# Patient Record
Sex: Male | Born: 1995 | Race: White | Hispanic: No | Marital: Single
Health system: Midwestern US, Community
[De-identification: ages and names within clinical notes are randomized; demographics above are authoritative.]

## PROBLEM LIST (undated history)

## (undated) ENCOUNTER — Ambulatory Visit (HOSPITAL_COMMUNITY): Admission: EM | Source: Home / Self Care

## (undated) DIAGNOSIS — E3431 Constitutional short stature: Secondary | ICD-10-CM

## (undated) DIAGNOSIS — E049 Nontoxic goiter, unspecified: Secondary | ICD-10-CM

## (undated) DIAGNOSIS — R569 Unspecified convulsions: Secondary | ICD-10-CM

## (undated) DIAGNOSIS — R630 Anorexia: Secondary | ICD-10-CM

## (undated) DIAGNOSIS — E300 Delayed puberty: Secondary | ICD-10-CM

## (undated) DIAGNOSIS — R6250 Unspecified lack of expected normal physiological development in childhood: Secondary | ICD-10-CM

## (undated) HISTORY — DX: Constitutional short stature: E34.31

## (undated) HISTORY — PX: OTHER SURGICAL HISTORY: SHX169

## (undated) HISTORY — DX: Delayed puberty: E30.0

## (undated) HISTORY — DX: Unspecified lack of expected normal physiological development in childhood: R62.50

## (undated) HISTORY — DX: Nontoxic goiter, unspecified: E04.9

## (undated) HISTORY — DX: Anorexia: R63.0

## (undated) HISTORY — PX: HAND SURGERY: SHX662

## (undated) HISTORY — DX: Unspecified convulsions: R56.9

---
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
## 2016-07-05 ENCOUNTER — Inpatient Hospital Stay
Admit: 2016-07-05 | Discharge: 2016-07-05 | Disposition: A | Discharge status: Home / Self Care | Payer: PRIVATE HEALTH INSURANCE | Attending: Emergency Medicine

## 2016-07-05 DIAGNOSIS — J029 Acute pharyngitis, unspecified: Secondary | ICD-10-CM

## 2016-07-05 LAB — STREP SCREEN GROUP A THROAT

## 2016-07-05 MED ORDER — PREDNISONE 10 MG PO TABS
10 MG | ORAL_TABLET | ORAL | 0 refills | Status: AC
Start: 2016-07-05 — End: 2016-07-15

## 2016-07-05 NOTE — ED Provider Notes (Signed)
[facility]  eMERGENCY dEPARTMENT eNCOUnter      Pt Name: Alfred Wood  MRN: [id number]  Birthdate Mar 16, 1996  Date of evaluation: 07/05/2016      CHIEF COMPLAINT       Chief Complaint   Patient presents with   ??? Pharyngitis         HISTORY OF PRESENT ILLNESS      The patient presents to the emergency department with a sore throat.  He said it started about 4 this morning.  He feels in the back of his throat.  He says he feels like his uvula is swollen.  He is able to swallow though with some discomfort.  He denies cough.  He denies other URI symptoms.  He denies fever.  He has had no nausea, vomiting or diarrhea.  Nothing makes the symptoms better or worse.  He has not had a rash.       REVIEW OF SYSTEMS       All systems reviewed and negative unless noted in HPI.  The patient denies fever or constitutional symptoms.    Denies vision change.   Sore throat as noted in HPI.  Denies any neck pain or stiffness.    Denies chest pain or shortness of breath.    No nausea,  vomiting or diarrhea.    Denies any dysuria.  Denies urinary frequency or hematuria.    Denies musculoskeletal injury or pain.   Denies any weakness, numbness or focal neurologic deficit.    Denies any skin rash or edema.    No recent psychiatric issues.   No easy bruising or bleeding.   Denies any polyuria, polydypsia or history of immunocompromise.       PAST MEDICAL HISTORY    has no past medical history on file.    SURGICAL HISTORY      has a past surgical history that includes Inner ear surgery (Right).    CURRENT MEDICATIONS       Previous Medications    No medications on file       ALLERGIES     has No Known Allergies.    FAMILY HISTORY     has no family status information on file.    family history is not on file.    SOCIAL HISTORY      reports that he quit smoking 10 days ago. His smoking use included Cigarettes. He does not have any smokeless tobacco history on file. He reports that he drinks alcohol. He reports that he  does not use illicit drugs.    PHYSICAL EXAM     INITIAL VITALS:  height is  (1.727 m) and weight is 59 kg (130 lb). His oral temperature is 97.9 ??F (36.6 ??C). His blood pressure is 135/91 (abnormal) and his pulse is 93. His respiration is 18 and oxygen saturation is 97%.      Patient is alert and oriented, in no apparent distress.    HEENT is atraumatic.    Pupils are PERRL at 5 mm.  Mucous membranes moist.  Posterior pharynx shows minimal erythema.  Uvula does appear slightly swollen.  No peritonsillar abscess.  Patient is managing secretions well and phonating normally.  Neck is supple with no lymphadenopathy.  No JVD.  No meningismus.    Heart sounds regular rate and rhythm with no gallops, murmurs, or rubs.    Lungs clear, no wheezes, rales or rhonchi.    Abdomen: soft, nontender with no pain to palpation.  No rebound or guarding.    Musculoskeletal exam shows no evidence of trauma.    Skin: no rash or edema.    Neurological exam reveals cranial nerves 2 through 12 grossly intact.  Patient has equal grips and normal deep tendon reflexes.    Psychiatric: appropriate  Lymphatics.:  No lymphadenopathy.       DIFFERENTIAL DIAGNOSIS/ MDM:     Strep pharyngitis, uvulitis, PTA    DIAGNOSTIC RESULTS       LABS:  Results for orders placed or performed during the hospital encounter of 07/05/16   Strep Screen Group A Throat   Result Value Ref Range    Specimen Description .THROAT     Special Requests NOT REPORTED     Direct Exam       Rapid Strep A negative. A negative Rapid Group A Strep Screen result does not    Direct Exam        rule out the possibility of Group A Streptococci in the specimen. A Group A    Direct Exam  strep DNA test will be performed.     Direct Exam       Performed at [facility] and [facility], [address]    Direct Exam  [address], [place], [place] [id number]     Status FINAL 07/05/2016          EMERGENCY DEPARTMENT COURSE:   Vitals:    Vitals:    07/05/16 0804   BP: (!) 135/91    Pulse: 93   Resp: 18   Temp: 97.9 ??F (36.6 ??C)   TempSrc: Oral   SpO2: 97%   Weight: 59 kg (130 lb)   Height: 5\' 8"  (1.727 m)     -------------------------  BP: (!) 135/91, Temp: 97.9 ??F (36.6 ??C), Pulse: 93, Resp: 18      Re-evaluation Notes    The patient's rapid strep is negative.  I will write for steroids for the swelling in his throat.  However, he has no airway issues.  I do not feel antibiotics are warranted.  He would like to go to work today.  I recommended gargling salt water.  He is discharged in good condition.      FINAL IMPRESSION      1. Acute pharyngitis, unspecified etiology          DISPOSITION/PLAN   DISPOSITION Decision to Discharge    Condition on Disposition    good    PATIENT REFERRED TO:  Rise PatienceNishit Srivastava, MD  [address]  [address]  [place] [id number]  [phone]    In 1 week        DISCHARGE MEDICATIONS:  New Prescriptions    PREDNISONE (DELTASONE) 10 MG TABLET    Take 4 tablets by mouth once daily for 5 days       (Please note that portions of this note were completed with a voice recognition program.  Efforts were made to edit the dictations but occasionally words are mis-transcribed.)    Luther BradleyEILEEN F Quynn Vilchis,, MD   Attending Emergency Physician         Luther BradleyEileen F Tyeshia Cornforth, MD  07/05/16 [phone]

## 2016-07-06 LAB — STREP A DNA PROBE, AMPLIFICATION: Direct Exam: NEGATIVE

## 2016-12-26 ENCOUNTER — Encounter (HOSPITAL_COMMUNITY): Payer: Self-pay | Admitting: Emergency Medicine

## 2016-12-26 ENCOUNTER — Ambulatory Visit (HOSPITAL_COMMUNITY)
Admission: EM | Admit: 2016-12-26 | Discharge: 2016-12-26 | Disposition: A | Discharge status: Home / Self Care | Payer: Managed Care, Other (non HMO) | Attending: Emergency Medicine | Admitting: Emergency Medicine

## 2016-12-26 DIAGNOSIS — J40 Bronchitis, not specified as acute or chronic: Secondary | ICD-10-CM

## 2016-12-26 MED ORDER — AZITHROMYCIN 250 MG PO TABS: ORAL_TABLET | ORAL | 0 refills | Status: DC

## 2016-12-26 MED ORDER — PREDNISONE 50 MG PO TABS: ORAL_TABLET | ORAL | 0 refills | Status: DC

## 2016-12-26 MED ORDER — ONDANSETRON HCL 4 MG PO TABS: 4.0000 mg | ORAL_TABLET | Freq: Three times a day (TID) | ORAL | 0 refills | Status: DC | PRN

## 2016-12-26 NOTE — Discharge Instructions (Signed)
You have bronchitis. Take azithromycin and prednisone as prescribed. Use zofran every 8 hours as needed for nausea or vomiting. You should see improvement in the next 3-5 days. If you develop fevers, difficulty breathing, or are just not getting better, please come back or go to the emergency room.

## 2016-12-26 NOTE — ED Provider Notes (Signed)
[facility]    CSN: [id number] Arrival date & time: 12/26/16  1742     History   Chief Complaint Chief Complaint  Patient presents with  . Cough    HPI Howard Pena is a 21 y.o. male.   HPI  He is a 21 year old man here for evaluation of cough. Symptoms started 4 or 5 days ago and have been getting gradually worse. He reports nasal congestion, sore throat, and cough. He also reports sharp pains over the xiphoid when he coughs. He has had some intermittent wheezing. He reports a subjective fever yesterday that was treated with Tylenol. No shortness of breath. He has had some nausea and vomiting yesterday and today. He is a smoker, but has not smoked since this started.  Past Medical History:  Diagnosis Date  . Constitutional growth delay   . Goiter   . Physical growth delay   . Poor appetite   . Puberty delay     Patient Active Problem List   Diagnosis Date Noted  . Physical growth delay   . Constitutional growth delay   . Goiter   . Poor appetite   . Puberty delay   . Lack of expected normal physiological development in childhood 04/21/2011  . Goiter, unspecified 04/21/2011  . 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 right ear surgery         Home Medications    Prior to Admission medications   Medication Sig Start Date End Date Taking? Authorizing Provider  azithromycin (ZITHROMAX Z-PAK) 250 MG tablet Take 2 pills today, then 1 pill daily until gone. 12/26/16   Charm Rings, MD  ondansetron (ZOFRAN) 4 MG tablet Take 1 tablet (4 mg total) by mouth every 8 (eight) hours as needed for nausea or vomiting. 12/26/16   Charm Rings, MD  predniSONE (DELTASONE) 50 MG tablet Take 1 pill daily for 5 days. 12/26/16   Charm Rings, MD    Family History Family History  Problem Relation Age of Onset  . Thyroid disease Mother     Social History Social History    Substance Use Topics  . Smoking status: Never Smoker  . Smokeless tobacco: Not on file  . Alcohol use Not on file     Allergies   Patient has no known allergies.   Review of Systems Review of Systems As in history of present illness  Physical Exam Triage Vital Signs ED Triage Vitals  Enc Vitals Group     BP 12/26/16 1924 123/66     Pulse Rate 12/26/16 1924 85     Resp 12/26/16 1924 16     Temp 12/26/16 1924 98.8 F (37.1 C)     Temp Source 12/26/16 1924 Oral     SpO2 12/26/16 1924 100 %     Weight --      Height --      Head Circumference --      Peak Flow --      Pain Score 12/26/16 1926 7     Pain Loc --      Pain Edu? --      Excl. in GC? --    No data found.   Updated Vital Signs BP 123/66 (BP Location: Left Arm)   Pulse 85   Temp 98.8 F (37.1 C) (Oral)   Resp 16   SpO2 100%   Visual Acuity Right Eye  Distance:   Left Eye Distance:   Bilateral Distance:    Right Eye Near:   Left Eye Near:    Bilateral Near:     Physical Exam  Constitutional: He is oriented to person, place, and time. He appears well-developed and well-nourished. No distress.  HENT:  Mouth/Throat: Oropharynx is clear and moist. No oropharyngeal exudate.  Neck: Neck supple.  Cardiovascular: Normal rate, regular rhythm and normal heart sounds.   No murmur heard. Pulmonary/Chest: Effort normal and breath sounds normal. No respiratory distress. He has no wheezes. He has no rales. He exhibits tenderness (over xiphoid).  Lymphadenopathy:    He has no cervical adenopathy.  Neurological: He is alert and oriented to person, place, and time.     UC Treatments / Results  Labs (all labs ordered are listed, but only abnormal results are displayed) Labs Reviewed - No data to display  EKG  EKG Interpretation None       Radiology No results found.  Procedures Procedures (including critical care time)  Medications Ordered in UC Medications - No data to display   Initial  Impression / Assessment and Plan / UC Course  I have reviewed the triage vital signs and the nursing notes.  Pertinent labs & imaging results that were available during my care of the patient were reviewed by me and considered in my medical decision making (see chart for details).  Clinical Course     Treat for bronchitis with azithromycin and prednisone. Zofran as needed for nausea and vomiting. Follow-up as needed.  Final Clinical Impressions(s) / UC Diagnoses   Final diagnoses:  Bronchitis    New Prescriptions Discharge Medication List as of 12/26/2016  8:36 PM    START taking these medications   Details  azithromycin (ZITHROMAX Z-PAK) 250 MG tablet Take 2 pills today, then 1 pill daily until gone., Normal    ondansetron (ZOFRAN) 4 MG tablet Take 1 tablet (4 mg total) by mouth every 8 (eight) hours as needed for nausea or vomiting., Starting Mon 12/26/2016, Normal    predniSONE (DELTASONE) 50 MG tablet Take 1 pill daily for 5 days., Normal         Charm RingsErin J Honig, MD 12/26/16 2049

## 2016-12-26 NOTE — ED Triage Notes (Signed)
[facility] a [facility] of a cough, congestion, sore throat and chest wall pain x 6 days.

## 2017-04-07 ENCOUNTER — Ambulatory Visit (INDEPENDENT_AMBULATORY_CARE_PROVIDER_SITE_OTHER): Payer: Managed Care, Other (non HMO)

## 2017-04-07 ENCOUNTER — Encounter (HOSPITAL_COMMUNITY): Payer: Self-pay | Admitting: Emergency Medicine

## 2017-04-07 ENCOUNTER — Ambulatory Visit (HOSPITAL_COMMUNITY)
Admission: EM | Admit: 2017-04-07 | Discharge: 2017-04-07 | Disposition: A | Discharge status: Home / Self Care | Payer: Managed Care, Other (non HMO) | Attending: Internal Medicine | Admitting: Internal Medicine

## 2017-04-07 DIAGNOSIS — S62231A Other displaced fracture of base of first metacarpal bone, right hand, initial encounter for closed fracture: Secondary | ICD-10-CM | POA: Diagnosis not present

## 2017-04-07 DIAGNOSIS — S62234A Other nondisplaced fracture of base of first metacarpal bone, right hand, initial encounter for closed fracture: Secondary | ICD-10-CM

## 2017-04-07 MED ORDER — IBUPROFEN 800 MG PO TABS
800.0000 mg | ORAL_TABLET | Freq: Once | ORAL | Status: AC
  Administered 2017-04-07: 800 mg via ORAL

## 2017-04-07 MED ORDER — IBUPROFEN 800 MG PO TABS
ORAL_TABLET | ORAL | Status: AC
  Filled 2017-04-07: qty 1

## 2017-04-07 NOTE — ED Provider Notes (Signed)
CSN: [id number]     Arrival date & time 04/07/17  1310 History   First MD Initiated Contact with Patient 04/07/17 1335     Chief Complaint  Patient presents with  . Wrist Pain   (Consider location/radiation/quality/duration/timing/severity/associated sxs/prior Treatment) 21 year old male presents to clinic for evaluation of right thumb. Reports he injured it two weeks ago playing basket ball. States he "jammed it" during the game and he has had worsening swelling, reduced grip strength, and the feeling of "it popping out" He is right handed   The history is provided by the patient.  Wrist Pain  This is a new problem. The current episode started more than 1 week ago. The problem occurs constantly. The problem has been gradually worsening. Pertinent negatives include no chest pain, no abdominal pain, no headaches and no shortness of breath. The symptoms are aggravated by bending. The symptoms are relieved by position. The treatment provided mild relief.    Past Medical History:  Diagnosis Date  . Constitutional growth delay   . Goiter   . Physical growth delay   . Poor appetite   . Puberty delay    Past Surgical History:  Procedure Laterality Date  . right ear surgery     Family History  Problem Relation Age of Onset  . Thyroid disease Mother    Social History  Substance Use Topics  . Smoking status: Former Smoker    Quit date: 03/09/2017  . Smokeless tobacco: Never Used  . Alcohol use Yes     Comment: occasional    Review of Systems  HENT: Negative.   Respiratory: Negative for cough and shortness of breath.   Cardiovascular: Negative for chest pain and palpitations.  Gastrointestinal: Negative for abdominal pain, nausea and vomiting.  Musculoskeletal: Positive for joint swelling. Negative for neck pain and neck stiffness.  Skin: Negative.   Neurological: Negative for light-headedness and headaches.    Allergies  Amoxicillin  Home Medications   Prior to Admission  medications   Medication Sig Start Date End Date Taking? Authorizing Provider  predniSONE (DELTASONE) 50 MG tablet Take 1 pill daily for 5 days. 12/26/16   Charm Rings, MD   Meds Ordered and Administered this Visit   Medications  ibuprofen (ADVIL,MOTRIN) tablet 800 mg (800 mg Oral Given 04/07/17 1345)    BP (!) 137/91   Pulse 90   Temp 99.1 F (37.3 C) (Oral)   Resp 16   Ht  (1.727 m)   Wt 135 lb (61.2 kg)   SpO2 100%   BMI 20.53 kg/m  No data found.   Physical Exam  Constitutional: He is oriented to person, place, and time. He appears well-developed and well-nourished. No distress.  HENT:  Head: Normocephalic and atraumatic.  Right Ear: External ear normal.  Left Ear: External ear normal.  Eyes: Conjunctivae are normal. Right eye exhibits no discharge. Left eye exhibits no discharge.  Musculoskeletal: He exhibits tenderness and deformity.  Neurological: He is alert and oriented to person, place, and time.  Skin: Skin is warm and dry. Capillary refill takes less than 2 seconds. He is not diaphoretic.  Psychiatric: He has a normal mood and affect. His behavior is normal.  Nursing note and vitals reviewed.   Urgent Care Course     Procedures (including critical care time)  Labs Review Labs Reviewed - No data to display  Imaging Review Dg Finger Thumb Right  Result Date: 04/07/2017 CLINICAL DATA:  Basketball injury with first  digit pain, initial encounter EXAM: RIGHT THUMB 2+V COMPARISON:  None. FINDINGS: Some fragmentation at the base of the first metacarpal is noted. No other focal abnormality is seen. IMPRESSION: Fracture through the base of the first metacarpal. Electronically Signed   By: Alcide Clever M.D.   On: 04/07/2017 13:58     MDM   1. Closed nondisplaced fracture of base of first metacarpal bone of right hand, unspecified fracture morphology, initial encounter     Patient had a nondisplaced fracture of the first metacarpal. Patient was placed in a  thumb spica, and referral made to hand orthopedics. Patient given counseling on over-the-counter medicines for pain management, encouraged to wear thumb spica continuously     Dorena Bodo, NP 04/07/17 1456

## 2017-04-07 NOTE — ED Triage Notes (Signed)
PT reports his thumb was severely jammed 2 weeks ago while playing basketball. PT reports continued pain in wrist and thumb

## 2017-04-07 NOTE — Discharge Instructions (Signed)
We have placed your thumb in a "thumb spica" for immobilization. Follow up with a hand specialist for further management of your fracture. Call is office to schedule an appointment for follow up care.

## 2019-09-08 ENCOUNTER — Emergency Department (HOSPITAL_COMMUNITY): Payer: Commercial Managed Care - PPO

## 2019-09-08 ENCOUNTER — Emergency Department (HOSPITAL_COMMUNITY)
Admission: EM | Admit: 2019-09-08 | Discharge: 2019-09-08 | Disposition: A | Discharge status: Home / Self Care | Payer: Commercial Managed Care - PPO | Attending: Emergency Medicine | Admitting: Emergency Medicine

## 2019-09-08 ENCOUNTER — Encounter (HOSPITAL_COMMUNITY): Payer: Self-pay | Admitting: Emergency Medicine

## 2019-09-08 ENCOUNTER — Other Ambulatory Visit: Payer: Self-pay

## 2019-09-08 DIAGNOSIS — F17210 Nicotine dependence, cigarettes, uncomplicated: Secondary | ICD-10-CM | POA: Insufficient documentation

## 2019-09-08 DIAGNOSIS — Z79899 Other long term (current) drug therapy: Secondary | ICD-10-CM | POA: Insufficient documentation

## 2019-09-08 DIAGNOSIS — R569 Unspecified convulsions: Secondary | ICD-10-CM | POA: Insufficient documentation

## 2019-09-08 DIAGNOSIS — R519 Headache, unspecified: Secondary | ICD-10-CM

## 2019-09-08 LAB — CBC
HCT: 41.1 % (ref 39.0–52.0)
Hemoglobin: 13.4 g/dL (ref 13.0–17.0)
MCH: 28 pg (ref 26.0–34.0)
MCHC: 32.6 g/dL (ref 30.0–36.0)
MCV: 86 fL (ref 80.0–100.0)
Platelets: 283 10*3/uL (ref 150–400)
RBC: 4.78 MIL/uL (ref 4.22–5.81)
RDW: 12.7 % (ref 11.5–15.5)
WBC: 5.3 10*3/uL (ref 4.0–10.5)
nRBC: 0 % (ref 0.0–0.2)

## 2019-09-08 LAB — URINALYSIS, ROUTINE W REFLEX MICROSCOPIC
Bacteria, UA: NONE SEEN
Bilirubin Urine: NEGATIVE
Glucose, UA: NEGATIVE mg/dL
Hgb urine dipstick: NEGATIVE
Ketones, ur: NEGATIVE mg/dL
Leukocytes,Ua: NEGATIVE
Nitrite: NEGATIVE
Protein, ur: 30 mg/dL — AB
Specific Gravity, Urine: 1.024 (ref 1.005–1.030)
pH: 6 (ref 5.0–8.0)

## 2019-09-08 LAB — RAPID URINE DRUG SCREEN, HOSP PERFORMED
Amphetamines: NOT DETECTED
Barbiturates: NOT DETECTED
Benzodiazepines: NOT DETECTED
Cocaine: NOT DETECTED
Opiates: NOT DETECTED
Tetrahydrocannabinol: NOT DETECTED

## 2019-09-08 LAB — BASIC METABOLIC PANEL
Anion gap: 9 (ref 5–15)
BUN: 15 mg/dL (ref 6–20)
CO2: 24 mmol/L (ref 22–32)
Calcium: 9.3 mg/dL (ref 8.9–10.3)
Chloride: 105 mmol/L (ref 98–111)
Creatinine, Ser: 0.91 mg/dL (ref 0.61–1.24)
GFR calc Af Amer: >60 mL/min (ref >60)
GFR calc non Af Amer: >60 mL/min (ref >60)
Glucose, Bld: 103 mg/dL — ABNORMAL HIGH (ref 70–99)
Potassium: 4.1 mmol/L (ref 3.5–5.1)
Sodium: 138 mmol/L (ref 135–145)

## 2019-09-08 LAB — ETHANOL: Alcohol, Ethyl (B): <10 mg/dL (ref <10)

## 2019-09-08 MED ORDER — ACETAMINOPHEN 325 MG PO TABS
650.0000 mg | ORAL_TABLET | Freq: Once | ORAL | Status: AC
  Administered 2019-09-08: 20:00:00 650 mg via ORAL
  Filled 2019-09-08: qty 2

## 2019-09-08 MED ORDER — SODIUM CHLORIDE 0.9 % IV BOLUS
1000.0000 mL | Freq: Once | INTRAVENOUS | Status: AC
  Administered 2019-09-08: 1000 mL via INTRAVENOUS

## 2019-09-08 NOTE — Discharge Instructions (Signed)
You were seen in the ED today after seizure like activity Your labwork and CT Head were all reassuring today Please follow up with [facility] regarding your seizure.  Follow up with your PCP as well regarding your ED visit today. If you do not have one you can follow up with [facility] as needed for primary care needs.  Return to the ED for any worsening symptoms.

## 2019-09-08 NOTE — ED Triage Notes (Signed)
Pt reports he was sleeping @ 45 min ago per girlfriend he began shaking in his sleep fell off the bed, continues shaking and foaming in his mouth. Pt does not recall events, Pt does have red/ raised area to R forehead and c/o HA. No seizure history.

## 2019-09-08 NOTE — ED Provider Notes (Signed)
[facility] EMERGENCY DEPARTMENT Provider Note   CSN: [id number] Arrival date & time: 09/08/19  1717     History   Chief Complaint Chief Complaint  Patient presents with  . Seizures    HPI Howard Pena is a 23 y.o. male who presents to the ED with seizure-like activity that began 45 minutes prior to arrival.  Patient reports that he works third shift and typically sleeps through the day.  He was asleep around 4 PM today when his girlfriend noticed that he began having full body shaking which caused him to fall off the floor and hit his head.  Girlfriend reports that patient was shaking for approximately 2 minutes prior to falling and hitting head.  He then continued to shake for approximately 3 minutes before it subsided.  Girlfriend reports the patient was "foaming at the mouth."  No History of seizure-like activity in the past.  Patient currently complaining of a headache but otherwise has no complaints.  He does have a hematoma to the right forehead.  States he smokes about 5 cigarettes/day.  He denies any drug use including marijuana.  Patient states he drinks about 112 ounce beer per day but denies heavy alcohol use.  Denies fever, chills, vision changes, unilateral weakness or numbness, neck stiffness, rash, nausea, vomiting, abdominal pain, chest pain, shortness of breath, any other associated symptoms.        Past Medical History:  Diagnosis Date  . Constitutional growth delay   . Goiter   . Physical growth delay   . Poor appetite   . Puberty delay     Patient Active Problem List   Diagnosis Date Noted  . Physical growth delay   . Constitutional growth delay   . Goiter   . Poor appetite   . Puberty delay   . Lack of expected normal physiological development in childhood 04/21/2011  . Goiter, unspecified 04/21/2011  . 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 right ear surgery          Home Medications    Prior to Admission medications   Medication Sig Start Date End Date Taking? Authorizing Provider  predniSONE (DELTASONE) 50 MG tablet Take 1 pill daily for 5 days. 12/26/16   Charm Rings, MD    Family History Family History  Problem Relation Age of Onset  . Thyroid disease Mother     Social History Social History   Tobacco Use  . Smoking status: Current Every Day Smoker  . Smokeless tobacco: Never Used  Substance Use Topics  . Alcohol use: Yes    Comment: occasional  . Drug use: No     Allergies   Amoxicillin   Review of Systems Review of Systems  Constitutional: Negative for chills and fever.  HENT: Negative for congestion.   Eyes: Negative for visual disturbance.  Respiratory: Negative for cough.   Cardiovascular: Negative for chest pain.  Gastrointestinal: Negative for abdominal pain, nausea and vomiting.  Genitourinary: Negative for difficulty urinating.  Musculoskeletal: Negative for myalgias, neck pain and neck stiffness.  Skin: Negative for rash.  Neurological: Positive for seizures and headaches.     Physical Exam Updated Vital Signs BP 135/87 (BP Location: Right Arm)   Pulse (!) 112   Temp 98.6 F (37 C) (Oral)   Resp 18   Ht 5\' 8"  (1.727 m)   Wt 61.2 kg   SpO2  100%   BMI 20.51 kg/m   Physical Exam Vitals signs and nursing note reviewed.  Constitutional:      Appearance: He is not ill-appearing.  HENT:     Head: Normocephalic.     Comments: Hematoma present to right forehead.  No raccoon sign or battle sign.  No hemotympanum bilaterally.  No lesions noted to oral cavity.  Airway intact.     Right Ear: Tympanic membrane normal.     Left Ear: Tympanic membrane normal.  Eyes:     Conjunctiva/sclera: Conjunctivae normal.  Neck:     Musculoskeletal: Neck supple.  Cardiovascular:     Rate and Rhythm: Normal rate and regular rhythm.     Pulses: Normal pulses.  Pulmonary:     Effort:  Pulmonary effort is normal.     Breath sounds: Normal breath sounds. No wheezing, rhonchi or rales.  Chest:     Chest wall: No tenderness.  Abdominal:     Palpations: Abdomen is soft.     Tenderness: There is no abdominal tenderness. There is no right CVA tenderness, left CVA tenderness, guarding or rebound.  Musculoskeletal: Normal range of motion.     Comments: No C, T, L midline spinal tenderness.  No tenderness throughout back diffusely.  No tenderness to all joints including shoulders, elbows, wrists, hips, knees, ankles.  Able to move extremities without difficulty. Strength 5 out of 5 to bilateral upper and lower extremities.  Patient intact throughout.  2+ radial and DP pulses bilaterally.   Skin:    General: Skin is warm and dry.  Neurological:     Mental Status: He is alert.     Comments: CN 3-12 grossly intact A&O x4 GCS 15 Sensation and strength intact Gait nonataxic including with tandem walking Coordination with finger-to-nose WNL Neg romberg, neg pronator drift       ED Treatments / Results  Labs (all labs ordered are listed, but only abnormal results are displayed) Labs Reviewed  BASIC METABOLIC PANEL - Abnormal; Notable for the following components:      Result Value   Glucose, Bld 103 (*)    All other components within normal limits  URINALYSIS, ROUTINE W REFLEX MICROSCOPIC - Abnormal; Notable for the following components:   Protein, ur 30 (*)    All other components within normal limits  CBC  RAPID URINE DRUG SCREEN, HOSP PERFORMED  ETHANOL  CBG MONITORING, ED    EKG None  Radiology Ct Head Wo Contrast  Result Date: 09/08/2019 CLINICAL DATA:  Seizure EXAM: CT HEAD WITHOUT CONTRAST TECHNIQUE: Contiguous axial images were obtained from the base of the skull through the vertex without intravenous contrast. COMPARISON:  None. FINDINGS: Brain: No evidence of acute infarction, hemorrhage, hydrocephalus, extra-axial collection or mass lesion/mass effect.  Vascular: No hyperdense vessel or unexpected calcification. Skull: Normal. Negative for fracture or focal lesion. Sinuses/Orbits: No acute finding. Other: None. IMPRESSION: No acute intracranial pathology. Electronically Signed   By: Eddie Candle M.D.   On: 09/08/2019 20:37    Procedures Procedures (including critical care time)  Medications Ordered in ED Medications  sodium chloride 0.9 % bolus 1,000 mL (1,000 mLs Intravenous New Bag/Given 09/08/19 2019)  acetaminophen (TYLENOL) tablet 650 mg (650 mg Oral Given 09/08/19 2018)     Initial Impression / Assessment and Plan / ED Course  I have reviewed the triage vital signs and the nursing notes.  Pertinent labs & imaging results that were available during my care of the patient were reviewed by  me and considered in my medical decision making (see chart for details).    23 year old male who presents with new onset seizure-like activity that began around 4 PM today and lasted approximately 5 minutes prior to subsiding.  Girlfriend witnessed activity.  States that patient fell off the bed and hit his head during this time.  And is currently complaining of a headache.  He has no other complaints at this time.  No other signs of head trauma including no lesions noted in the oral cavity.  No malocclusion.  No tenderness throughout body including midline back or any joints.  She denies any illicit drug use.  He does smoke about 5 cigarettes/day.  Does drink 1 12 ounce beer per day.    Blood work obtained prior to being seen.  No electrolyte abnormalities.  Glucose within normal range.  Without history of diabetes.  No leukocytosis.  Hemoglobin stable.  Added on EtOH, urinalysis, UDS.  Will obtain CT head given new onset seizure as well as patient hit his head on the floor.  He is not anticoagulated.  Has no focal neuro deficits on exam today.  No meningeal signs.  He is afebrile in the ED.  Initially tachycardic on arrival but this has decreased while in  room.  If all findings equivocal will have patient follow-up outpatient with neurology.   Urinalysis and UDS negative.  EtOH negative.  CT head reassuring today without acute findings.  Since headache treated with Tylenol.  He is advised to follow-up with neurology regarding his seizure.  [facility] as well given patient does not have PCP.  Strict return precautions have been discussed with patient.  He is in agreement with plan at this time stable for discharge home.   This note was prepared using Dragon voice recognition software and may include unintentional dictation errors due to the inherent limitations of voice recognition software.       Final Clinical Impressions(s) / ED Diagnoses   Final diagnoses:  Seizure (HCC)  Nonintractable headache, unspecified chronicity pattern, unspecified headache type    ED Discharge Orders    None       Tanda RockersVenter, Lougenia Morrissey, Cordelia Poche-C 09/08/19 2045    Lorre NickAllen, Anthony, MD 09/11/19 [phone]

## 2020-01-22 ENCOUNTER — Other Ambulatory Visit: Payer: Self-pay

## 2020-01-22 ENCOUNTER — Encounter: Payer: Self-pay | Admitting: Neurology

## 2020-01-22 ENCOUNTER — Ambulatory Visit: Payer: Commercial Managed Care - PPO | Admitting: Neurology

## 2020-01-22 VITALS — BP 142/102 | HR 79 | Temp 97.2°F | Ht 68.0 in | Wt 125.0 lb

## 2020-01-22 DIAGNOSIS — G40802 Other epilepsy, not intractable, without status epilepticus: Secondary | ICD-10-CM | POA: Diagnosis not present

## 2020-01-22 MED ORDER — LEVETIRACETAM 750 MG PO TABS: 750.0000 mg | ORAL_TABLET | Freq: Two times a day (BID) | ORAL | 11 refills | Status: DC

## 2020-01-22 NOTE — Patient Instructions (Signed)
Seizure, Adult A seizure is a sudden burst of abnormal electrical activity in the brain. Seizures usually last from 30 seconds to 2 minutes. They can cause many different symptoms. Usually, seizures are not harmful unless they last a long time. What are the causes? Common causes of this condition include:  Fever or infection.  Conditions that affect the brain, such as: ? A brain abnormality that you were born with. ? A brain or head injury. ? Bleeding in the brain. ? A tumor. ? Stroke. ? Brain disorders such as autism or cerebral palsy.  Low blood sugar.  Conditions that are passed from parent to child (are inherited).  Problems with substances, such as: ? Having a reaction to a drug or a medicine. ? Suddenly stopping the use of a substance (withdrawal). In some cases, the cause may not be known. A person who has repeated seizures over time without a clear cause has a condition called epilepsy. What increases the risk? You are more likely to get this condition if you have:  A family history of epilepsy.  Had a seizure in the past.  A brain disorder.  A history of head injury, lack of oxygen at birth, or strokes. What are the signs or symptoms? There are many types of seizures. The symptoms vary depending on the type of seizure you have. Examples of symptoms during a seizure include:  Shaking (convulsions).  Stiffness in the body.  Passing out (losing consciousness).  Head nodding.  Staring.  Not responding to sound or touch.  Loss of bladder control and bowel control. Some people have symptoms right before and right after a seizure happens. Symptoms before a seizure may include:  Fear.  Worry (anxiety).  Feeling like you may vomit (nauseous).  Feeling like the room is spinning (vertigo).  Feeling like you saw or heard something before (dj vu).  Odd tastes or smells.  Changes in how you see. You may see flashing lights or spots. Symptoms after a  seizure happens can include:  Confusion.  Sleepiness.  Headache.  Weakness on one side of the body. How is this treated? Most seizures will stop on their own in under 5 minutes. In these cases, no treatment is needed. Seizures that last longer than 5 minutes will usually need treatment. Treatment can include:  Medicines given through an IV tube.  Avoiding things that are known to cause your seizures. These can include medicines that you take for another condition.  Medicines to treat epilepsy.  Surgery to stop the seizures. This may be needed if medicines do not help. Follow these instructions at home: Medicines  Take over-the-counter and prescription medicines only as told by your doctor.  Do not eat or drink anything that may keep your medicine from working, such as alcohol. Activity  Do not do any activities that would be dangerous if you had another seizure, like driving or swimming. Wait until your doctor says it is safe for you to do them.  If you live in the [place] ask your local DMV (department of motor vehicles) when you can drive.  Get plenty of rest. Teaching others Teach friends and family what to do when you have a seizure. They should:  Lay you on the ground.  Protect your head and body.  Loosen any tight clothing around your neck.  Turn you on your side.  Not hold you down.  Not put anything into your mouth.  Know whether or not you need emergency care.  Stay   with you until you are better.  General instructions  Contact your doctor each time you have a seizure.  Avoid anything that gives you seizures.  Keep a seizure diary. Write down: ? What you think caused each seizure. ? What you remember about each seizure.  Keep all follow-up visits as told by your doctor. This is important. Contact a doctor if:  You have another seizure.  You have seizures more often.  There is any change in what happens during your seizures.  You keep having  seizures with treatment.  You have symptoms of being sick or having an infection. Get help right away if:  You have a seizure that: ? Lasts longer than 5 minutes. ? Is different than seizures you had before. ? Makes it harder to breathe. ? Happens after you hurt your head.  You have any of these symptoms after a seizure: ? Not being able to speak. ? Not being able to use a part of your body. ? Confusion. ? A bad headache.  You have two or more seizures in a row.  You do not wake up right after a seizure.  You get hurt during a seizure. These symptoms may be an emergency. Do not wait to see if the symptoms will go away. Get medical help right away. Call your local emergency services (911 in the [place] Do not drive yourself to the hospital. Summary  Seizures usually last from 30 seconds to 2 minutes. Usually, they are not harmful unless they last a long time.  Do not eat or drink anything that may keep your medicine from working, such as alcohol.  Teach friends and family what to do when you have a seizure.  Contact your doctor each time you have a seizure. This information is not intended to replace advice given to you by your health care provider. Make sure you discuss any questions you have with your health care provider. Document Revised: 02/22/2019 Document Reviewed: 02/22/2019 Elsevier Patient Education  2020 Else[employer] Inc[employer] Levetiracetam tablets What is this medicine? LEVETIRACETAM (lee ve tye RA se tam) is an antiepileptic drug. It is used with other medicines to treat certain types of seizures. This medicine may be used for other purposes; ask your health care provider or pharmacist if you have questions. COMMON BRAND NAME(S): Keppra, Roweepra What should I tell my health care provider before I take this medicine? They need to know if you have any of these conditions:  kidney disease  suicidal thoughts, plans, or attempt; a previous suicide attempt by you or a  family member  an unusual or allergic reaction to levetiracetam, other medicines, foods, dyes, or preservatives  pregnant or trying to get pregnant  breast-feeding How should I use this medicine? Take this medicine by mouth with a glass of water. Follow the directions on the prescription label. Swallow the tablets whole. Do not crush or chew this medicine. You may take this medicine with or without food. Take your doses at regular intervals. Do not take your medicine more often than directed. Do not stop taking this medicine or any of your seizure medicines unless instructed by your doctor or health care professional. Stopping your medicine suddenly can increase your seizures or their severity. A special MedGuide will be given to you by the pharmacist with each prescription and refill. Be sure to read this information carefully each time. Contact your pediatrician or health care professional regarding the use of this medication in children. While this drug may be  prescribed for children as young as 4 years of age for selected conditions, precautions do apply. Overdosage: If you think you have taken too much of this medicine contact a poison control center or emergency room at once. NOTE: This medicine is only for you. Do not share this medicine with others. What if I miss a dose? If you miss a dose, take it as soon as you can. If it is almost time for your next dose, take only that dose. Do not take double or extra doses. What may interact with this medicine? This medicine may interact with the following medications:  carbamazepine  colesevelam  probenecid  sevelamer This list may not describe all possible interactions. Give your health care provider a list of all the medicines, herbs, non-prescription drugs, or dietary supplements you use. Also tell them if you smoke, drink alcohol, or use illegal drugs. Some items may interact with your medicine. What should I watch for while using this  medicine? Visit your doctor or health care provider for a regular check on your progress. Wear a medical identification bracelet or chain to say you have epilepsy, and carry a card that lists all your medications. This medicine may cause serious skin reactions. They can happen weeks to months after starting the medicine. Contact your health care provider right away if you notice fevers or flu-like symptoms with a rash. The rash may be red or purple and then turn into blisters or peeling of the skin. Or, you might notice a red rash with swelling of the face, lips or lymph nodes in your neck or under your arms. It is important to take this medicine exactly as instructed by your health care provider. When first starting treatment, your dose may need to be adjusted. It may take weeks or months before your dose is stable. You should contact your doctor or health care provider if your seizures get worse or if you have any new types of seizures. You may get drowsy or dizzy. Do not drive, use machinery, or do anything that needs mental alertness until you know how this medicine affects you. Do not stand or sit up quickly, especially if you are an older patient. This reduces the risk of dizzy or fainting spells. Alcohol may interfere with the effect of this medicine. Avoid alcoholic drinks. The use of this medicine may increase the chance of suicidal thoughts or actions. Pay special attention to how you are responding while on this medicine. Any worsening of mood, or thoughts of suicide or dying should be reported to your health care provider right away. Women who become pregnant while using this medicine may enroll in the [place] Antiepileptic Drug Pregnancy Registry by calling 1-888-233-2334. This registry collects information about the safety of antiepileptic drug use during pregnancy. What side effects may I notice from receiving this medicine? Side effects that you should report to your doctor or health  care professional as soon as possible:  allergic reactions like skin rash, itching or hives, swelling of the face, lips, or tongue  breathing problems  dark urine  general ill feeling or flu-like symptoms  problems with balance, talking, walking  rash, fever, and swollen lymph nodes  redness, blistering, peeling or loosening of the skin, including inside the mouth  unusually weak or tired  worsening of mood, thoughts or actions of suicide or dying  yellowing of the eyes or skin Side effects that usually do not require medical attention (report to your doctor or health care   professional if they continue or are bothersome):  diarrhea  dizzy, drowsy  headache  loss of appetite This list may not describe all possible side effects. Call your doctor for medical advice about side effects. You may report side effects to FDA at 1-800-FDA-1088. Where should I keep my medicine? Keep out of reach of children. Store at room temperature between 15 and 30 degrees C (59 and 86 degrees F). Throw away any unused medicine after the expiration date. NOTE: This sheet is a summary. It may not cover all possible information. If you have questions about this medicine, talk to your doctor, pharmacist, or health care provider.  2020 Elsevier/Gold Standard (2019-03-08 15:23:36)

## 2020-01-22 NOTE — Progress Notes (Signed)
S[facility]EP [facility]INE CL[facility]IC    Provider:  Melvyn Novas, MD  Primary Care Physician:  Patient, No Pcp Per No address on file     Referring Provider: Lorre Nick, MD         Chief Complaint according to patient   Patient presents with:    . New Patient (Initial Visit)           HISTORY OF PRESENT ILLNESS:  Howard Pena is a 24 y.o. year old  African American male patient and  seen here on 01/22/2020 for a seizure work up. He presented to the ED on 09-08-19, and was referred to neurology. He had 4 spells total.  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  He was asleep in the afternoon which is his usual time to rest after work until his very first seizure occurred at about 4 or 4:30 PM.  Since then all other spells have also occurred out of sleep and has been witnessed by his male partner.  He has not woken up from the spells directly but she has noticed frothy discharge at the mouth and most recently bleeding as the patient suffered a tongue bite.  When he finally came to his body was sore and achy, he was confused and felt exhausted completely fatigued.  The most recent spell was also followed by a headache.  One of the nights that he had seizures and sleep he had 2 spells in short succession and his feels it was not quite sure how long each spell lasted but he seemed to have been snoring loudly and was very difficult to arouse he was unresponsive.  Heavy or labored breathing is a common side effect of post ictal recovery. He was very dizzy and nauseated after his seizure, symptoms lasted  for a day   Chief concern according to patient : sleep seizures.    I have the pleasure of seeing Howard Pena today, a right-handedAfrican American male with a possible nocturnal seizure/ sleep disorder.  He  has a past medical history of Constitutional growth delay, Goiter,  Physical growth delay, Poor appetite, and Puberty delay. " late bloomer".      Family medical history: no seizure history    Social history:  Patient is working as a Production designer, theatre/television/film and lives in a household with fiancee and new born baby- son is 55 month old. The patient currently works in shifts( Chief Technology Officer,) Pets are present: bearded dragon. Tobacco use quit smoking at sons birthday.  ETOH use - moderate ,  Caffeine intake in form of Coffee( none) Soda( at work - 2-3 a day) Tea ( rare ) and lots of  energy drinks- 2-3 monsters.  Hobbies : none .    Sleep habits are as follows: The patient's dinner time is at work- night shifts- 5 Pm through 6 AM.  He is home by 8.30 AM-  Sleeping 10 AM -16 hours. Averaging 12-16 hours at work and 6-7 hours of sleep.  The preferred sleep position is prone ,  Co-sleeping with baby and fiancee- with the support of 1 pillows.  Dreams are reportedly frequent.    Review of Systems: Out of a complete 14 system review, the patient complains of only the following symptoms, and all other reviewed systems are negative.:    Seizure- sleep   Social History   Socioeconomic History  .  Marital status: Single    Spouse name: Not on file  . Number of children: Not on file  . Years of education: Not on file  . Highest education level: Not on file  Occupational History  . Not on file  Tobacco Use  . Smoking status: Current Every Day Smoker  . Smokeless tobacco: Never Used  Substance and Sexual Activity  . Alcohol use: Yes    Comment: occasional  . Drug use: No  . Sexual activity: Not on file  Other Topics Concern  . Not on file  Social History Narrative  . Not on file   Social Determinants of Health   Financial Resource Strain:   . Difficulty of Paying Living Expenses: Not on file  Food Insecurity:   . Worried About Programme researcher, broadcasting/film/video in the Last Year: Not on file  . Ran Out of Food in the Last Year: Not on file  Transportation Needs:   .  Lack of Transportation (Medical): Not on file  . Lack of Transportation (Non-Medical): Not on file  Physical Activity:   . Days of Exercise per Week: Not on file  . Minutes of Exercise per Session: Not on file  Stress:   . Feeling of Stress : Not on file  Social Connections:   . Frequency of Communication with Friends and Family: Not on file  . Frequency of Social Gatherings with Friends and Family: Not on file  . Attends Religious Services: Not on file  . Active Member of Clubs or Organizations: Not on file  . Attends Banker Meetings: Not on file  . Marital Status: Not on file    Family History  Problem Relation Age of Onset  . Thyroid disease Mother     Past Medical History:  Diagnosis Date  . Constitutional growth delay   . Goiter   . Physical growth delay   . Poor appetite   . Puberty delay     Past Surgical History:  Procedure Laterality Date  . right ear surgery       No current outpatient medications on file prior to visit.   No current facility-administered medications on file prior to visit.    Allergies  Allergen Reactions  . Amoxicillin     Physical exam:  Today's Vitals   01/22/20 0923  BP: (!) 142/102  Pulse: 79  Temp: (!) 97.2 F (36.2 C)  Weight: 125 lb (56.7 kg)  Height: 5\' 8"  (1.727 m)   Body mass index is 19.01 kg/m.   Wt Readings from Last 3 Encounters:  01/22/20 125 lb (56.7 kg)  09/08/19 134 lb 14.7 oz (61.2 kg)  04/07/17 135 lb (61.2 kg)     Ht Readings from Last 3 Encounters:  01/22/20 5\' 8"  (1.727 m)  09/08/19 5\' 8"  (1.727 m)  04/07/17 5\' 8"  (1.727 m)      General: The patient is awake, alert and appears not in acute distress. The patient is well groomed. Head: Normocephalic, atraumatic. Neck is supple. Mallampati 1,  neck circumference:14 inches . Nasal airflow patent.  Retrognathia is mild . Tongue bite mark has healed.  Dental status: intact  Cardiovascular:  Regular rate and cardiac rhythm by pulse,   without distended neck veins. Respiratory: Lungs are clear to auscultation.  Skin:  Without evidence of ankle edema, or rash. Trunk: The patient's posture is erect.   Neurologic exam : The patient is awake and alert, oriented to place and time.   Memory subjective described  as intact.  Attention span & concentration ability appears normal.  Speech is fluent,  without dysarthria, dysphonia or aphasia.  Mood and affect are appropriate.   Cranial nerves: no loss of smell or taste reported  Pupils are equal and briskly reactive to light. Funduscopic exam deferred.   Extraocular movements in vertical and horizontal planes were intact and without nystagmus. No Diplopia. Visual fields by finger perimetry are intact. Hearing was intact to soft voice and finger rubbing.    Facial sensation intact to fine touch. Facial motor strength is symmetric and tongue and uvula move midline.  Neck ROM : rotation, tilt and flexion extension were normal for age and shoulder shrug was symmetrical.    Motor exam:  Symmetric bulk, tone and ROM.   Normal tone without cog wheeling, symmetric grip strength .   Sensory:  Fine touch, pinprick and vibration were tested  and  normal.  Proprioception tested in the upper extremities was normal. Coordination: Rapid alternating movements in the fingers/hands were of normal speed.   Gait and station: Patient could rise unassisted from a seated position, walked without assistive device.  Stance is of normal width/ base and the patient turned with 3 steps.  Toe and heel walk were deferred.  Deep tendon reflexes: in the  upper and lower extremities are symmetric and intact.  Babinski response was deferred.        After spending a total time of 45 minutes face to face and additional time for physical and neurologic examination, review of laboratory studies,  personal review of imaging studies, reports and results of other testing and review of referral information /  records as far as provided in visit, I have established the following assessments:  1)  Nocturnal/ sleep seizure- in a shift worker with high consumption of energy drinks.   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  2) BRAIN MRI  with and without. 3) Keppra 750 mg bid.   I would like to thank the ED [facility] , Dr Lacretia Leigh, MD , for allowing me to see Mr. Novosad.  In short, Kathryn Linarez is presenting with new onset seizures.  I plan to follow up either personally or through our NP within 1-2 month.   CC: I will share my notes with [facility], Dr Trilby Drummer, DO  The patient understood that driving restrictions apply for 6 month, and those extend to his operating machinery at work.   Keppra was ordered as a generic 750 mg bid.   Electronically signed by: Larey Seat, MD 01/22/2020 9:49 AM  [facility] certified by The [employer] of Sleep Medicine and Diplomate of the [employer] of Sleep Medicine. Board certified In Neurology through the [employer], Fellow of the [employer] of Neurology. Medical Director of [employer].

## 2020-01-26 ENCOUNTER — Ambulatory Visit (INDEPENDENT_AMBULATORY_CARE_PROVIDER_SITE_OTHER): Payer: Commercial Managed Care - PPO | Admitting: Neurology

## 2020-01-26 DIAGNOSIS — G478 Other sleep disorders: Secondary | ICD-10-CM | POA: Diagnosis not present

## 2020-01-26 DIAGNOSIS — G4726 Circadian rhythm sleep disorder, shift work type: Secondary | ICD-10-CM

## 2020-01-26 DIAGNOSIS — G40802 Other epilepsy, not intractable, without status epilepticus: Secondary | ICD-10-CM

## 2020-01-27 ENCOUNTER — Telehealth: Payer: Self-pay | Admitting: *Deleted

## 2020-01-27 ENCOUNTER — Telehealth: Payer: Self-pay | Admitting: Neurology

## 2020-01-27 NOTE — Telephone Encounter (Signed)
received paperwork to be completed for the patient for accomodations to be made at his place of employment. I have completed the paperwork and placed on Debra's desk for the patient.

## 2020-01-27 NOTE — Telephone Encounter (Signed)
Pt H.T form ready for p/u at the front desk. Called pt no answer.

## 2020-01-28 ENCOUNTER — Observation Stay (HOSPITAL_COMMUNITY)
Admission: EM | Admit: 2020-01-28 | Discharge: 2020-01-29 | Disposition: A | Discharge status: Home / Self Care | Payer: Commercial Managed Care - PPO | Attending: Internal Medicine | Admitting: Internal Medicine

## 2020-01-28 ENCOUNTER — Emergency Department (HOSPITAL_COMMUNITY): Payer: Commercial Managed Care - PPO

## 2020-01-28 ENCOUNTER — Encounter (HOSPITAL_COMMUNITY): Payer: Self-pay | Admitting: Student

## 2020-01-28 DIAGNOSIS — J387 Other diseases of larynx: Secondary | ICD-10-CM

## 2020-01-28 DIAGNOSIS — N179 Acute kidney failure, unspecified: Secondary | ICD-10-CM | POA: Diagnosis not present

## 2020-01-28 DIAGNOSIS — Z20822 Contact with and (suspected) exposure to covid-19: Secondary | ICD-10-CM | POA: Diagnosis not present

## 2020-01-28 DIAGNOSIS — J039 Acute tonsillitis, unspecified: Secondary | ICD-10-CM

## 2020-01-28 DIAGNOSIS — J36 Peritonsillar abscess: Secondary | ICD-10-CM | POA: Diagnosis not present

## 2020-01-28 DIAGNOSIS — F17200 Nicotine dependence, unspecified, uncomplicated: Secondary | ICD-10-CM | POA: Insufficient documentation

## 2020-01-28 DIAGNOSIS — J029 Acute pharyngitis, unspecified: Secondary | ICD-10-CM | POA: Diagnosis present

## 2020-01-28 DIAGNOSIS — G40909 Epilepsy, unspecified, not intractable, without status epilepticus: Secondary | ICD-10-CM | POA: Diagnosis not present

## 2020-01-28 LAB — CBC WITH DIFFERENTIAL/PLATELET
Abs Immature Granulocytes: 0.12 10*3/uL — ABNORMAL HIGH (ref 0.00–0.07)
Basophils Absolute: 0 10*3/uL (ref 0.0–0.1)
Basophils Relative: 0 %
Eosinophils Absolute: 0 10*3/uL (ref 0.0–0.5)
Eosinophils Relative: 0 %
HCT: 46.7 % (ref 39.0–52.0)
Hemoglobin: 14.9 g/dL (ref 13.0–17.0)
Immature Granulocytes: 1 %
Lymphocytes Relative: 6 %
Lymphs Abs: 1 10*3/uL (ref 0.7–4.0)
MCH: 27.9 pg (ref 26.0–34.0)
MCHC: 31.9 g/dL (ref 30.0–36.0)
MCV: 87.5 fL (ref 80.0–100.0)
Monocytes Absolute: 1.6 10*3/uL — ABNORMAL HIGH (ref 0.1–1.0)
Monocytes Relative: 10 %
Neutro Abs: 13.6 10*3/uL — ABNORMAL HIGH (ref 1.7–7.7)
Neutrophils Relative %: 83 %
Platelets: 260 10*3/uL (ref 150–400)
RBC: 5.34 MIL/uL (ref 4.22–5.81)
RDW: 12.5 % (ref 11.5–15.5)
WBC: 16.3 10*3/uL — ABNORMAL HIGH (ref 4.0–10.5)
nRBC: 0 % (ref 0.0–0.2)

## 2020-01-28 LAB — RESPIRATORY PANEL BY RT PCR (FLU A&B, COVID)
Influenza A by PCR: NEGATIVE
Influenza B by PCR: NEGATIVE
SARS Coronavirus 2 by RT PCR: NEGATIVE

## 2020-01-28 LAB — BASIC METABOLIC PANEL
Anion gap: 13 (ref 5–15)
BUN: 19 mg/dL (ref 6–20)
CO2: 27 mmol/L (ref 22–32)
Calcium: 10.1 mg/dL (ref 8.9–10.3)
Chloride: 98 mmol/L (ref 98–111)
Creatinine, Ser: 1.49 mg/dL — ABNORMAL HIGH (ref 0.61–1.24)
GFR calc Af Amer: >60 mL/min (ref >60)
GFR calc non Af Amer: >60 mL/min (ref >60)
Glucose, Bld: 105 mg/dL — ABNORMAL HIGH (ref 70–99)
Potassium: 4.4 mmol/L (ref 3.5–5.1)
Sodium: 138 mmol/L (ref 135–145)

## 2020-01-28 LAB — HIV ANTIBODY (ROUTINE TESTING W REFLEX): HIV Screen 4th Generation wRfx: NONREACTIVE

## 2020-01-28 MED ORDER — ENOXAPARIN SODIUM 40 MG/0.4ML ~~LOC~~ SOLN
40.0000 mg | SUBCUTANEOUS | Status: DC
  Administered 2020-01-28: 18:00:00 40 mg via SUBCUTANEOUS
  Filled 2020-01-28: qty 0.4

## 2020-01-28 MED ORDER — SODIUM CHLORIDE 0.9 % IV BOLUS
1000.0000 mL | Freq: Once | INTRAVENOUS | Status: AC
  Administered 2020-01-28: 1000 mL via INTRAVENOUS

## 2020-01-28 MED ORDER — DEXAMETHASONE SODIUM PHOSPHATE 10 MG/ML IJ SOLN
10.0000 mg | Freq: Once | INTRAMUSCULAR | Status: AC
  Administered 2020-01-28: 12:00:00 10 mg via INTRAVENOUS
  Filled 2020-01-28: qty 1

## 2020-01-28 MED ORDER — FENTANYL CITRATE (PF) 100 MCG/2ML IJ SOLN: 50.0000 ug | INTRAMUSCULAR | Status: DC | PRN

## 2020-01-28 MED ORDER — ONDANSETRON HCL 4 MG/2ML IJ SOLN
4.0000 mg | Freq: Once | INTRAMUSCULAR | Status: AC
  Administered 2020-01-28: 14:00:00 4 mg via INTRAVENOUS
  Filled 2020-01-28: qty 2

## 2020-01-28 MED ORDER — ACETAMINOPHEN 650 MG RE SUPP: 650.0000 mg | Freq: Four times a day (QID) | RECTAL | Status: DC | PRN

## 2020-01-28 MED ORDER — SODIUM CHLORIDE 0.9 % IV SOLN
750.0000 mg | Freq: Two times a day (BID) | INTRAVENOUS | Status: DC
  Administered 2020-01-28 – 2020-01-29 (×2): 750 mg via INTRAVENOUS
  Filled 2020-01-28 (×6): qty 7.5

## 2020-01-28 MED ORDER — ACETAMINOPHEN 325 MG PO TABS: 650.0000 mg | ORAL_TABLET | Freq: Four times a day (QID) | ORAL | Status: DC | PRN

## 2020-01-28 MED ORDER — CLINDAMYCIN PHOSPHATE 600 MG/50ML IV SOLN
600.0000 mg | Freq: Four times a day (QID) | INTRAVENOUS | Status: DC
  Administered 2020-01-28 – 2020-01-29 (×4): 600 mg via INTRAVENOUS
  Filled 2020-01-28 (×4): qty 50

## 2020-01-28 MED ORDER — MORPHINE SULFATE (PF) 4 MG/ML IV SOLN
4.0000 mg | Freq: Once | INTRAVENOUS | Status: AC
  Administered 2020-01-28: 17:00:00 4 mg via INTRAVENOUS
  Filled 2020-01-28: qty 1

## 2020-01-28 MED ORDER — CLINDAMYCIN PHOSPHATE 600 MG/50ML IV SOLN
600.0000 mg | Freq: Once | INTRAVENOUS | Status: AC
  Administered 2020-01-28: 12:00:00 600 mg via INTRAVENOUS
  Filled 2020-01-28: qty 50

## 2020-01-28 MED ORDER — FENTANYL CITRATE (PF) 100 MCG/2ML IJ SOLN
50.0000 ug | Freq: Once | INTRAMUSCULAR | Status: AC
  Administered 2020-01-28: 12:00:00 50 ug via INTRAVENOUS
  Filled 2020-01-28: qty 2

## 2020-01-28 MED ORDER — IOHEXOL 300 MG/ML  SOLN
75.0000 mL | Freq: Once | INTRAMUSCULAR | Status: AC | PRN
  Administered 2020-01-28: 75 mL via INTRAVENOUS

## 2020-01-28 MED ORDER — MORPHINE SULFATE (PF) 4 MG/ML IV SOLN
4.0000 mg | Freq: Once | INTRAVENOUS | Status: AC
  Administered 2020-01-28: 14:00:00 4 mg via INTRAVENOUS
  Filled 2020-01-28: qty 1

## 2020-01-28 MED ORDER — SODIUM CHLORIDE 0.9 % IV SOLN: INTRAVENOUS | Status: AC

## 2020-01-28 NOTE — ED Provider Notes (Signed)
[facility] EMERGENCY DEPARTMENT Provider Note   CSN: [id number] Arrival date & time: 01/28/20  1017     History Chief Complaint  Patient presents with  . Sore Throat    Howard Pena is a 24 y.o. male with a history of goiter & constitutional growth delay who presents to the ED with complaints of sore throat for the past couple of days. Patient states pain is more to the R side of throat radiating into the R ear, it is constant, worse with swallowing, and associated with uvular swelling, chills, and change in his voice, no alleviating factors. Has had minimal PO intake secondary to pain, states he last had water just PTA this AM, has not really had solid foods in a couple days. Denies fever, nasal congestion, nausea, or vomiting. No hx of similar. No known covid 19 exposures.   HPI     Past Medical History:  Diagnosis Date  . Constitutional growth delay   . Goiter   . Physical growth delay   . Poor appetite   . Puberty delay     Patient Active Problem List   Diagnosis Date Noted  . Physical growth delay   . Constitutional growth delay   . Goiter   . Poor appetite   . Puberty delay   . Lack of expected normal physiological development in childhood 04/21/2011  . Goiter, unspecified 04/21/2011  . 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 right ear surgery         Family History  Problem Relation Age of Onset  . Thyroid disease Mother     Social History   Tobacco Use  . Smoking status: Current Every Day Smoker  . Smokeless tobacco: Never Used  Substance Use Topics  . Alcohol use: Yes    Comment: occasional  . Drug use: No    Home Medications Prior to Admission medications   Medication Sig Start Date End Date Taking? Authorizing Provider  levETIRAcetam (KEPPRA) 750 MG tablet Take 1 tablet (750 mg total) by mouth 2 (two) times daily. 01/22/20    Dohmeier, Porfirio Mylar, MD    Allergies    Amoxicillin  Review of Systems   Review of Systems  Constitutional: Positive for chills. Negative for fever.  HENT: Positive for ear pain, sore throat, trouble swallowing and voice change. Negative for congestion.   Respiratory: Negative for cough.   Cardiovascular: Negative for chest pain.  Gastrointestinal: Negative for abdominal pain, nausea and rectal pain.  Neurological: Negative for syncope.  All other systems reviewed and are negative.   Physical Exam Updated Vital Signs BP 114/78 (BP Location: Left Arm)   Pulse (!) 108   Temp 99.3 F (37.4 C) (Oral)   Resp 16   SpO2 100%   Physical Exam Vitals and nursing note reviewed.  Constitutional:      General: He is not in acute distress.    Appearance: He is well-developed. He is not toxic-appearing.  HENT:     Head: Normocephalic and atraumatic.     Right Ear: Ear canal normal. Tympanic membrane is not perforated, erythematous, retracted or bulging.     Left Ear: Ear canal normal. Tympanic membrane is not perforated, erythematous, retracted or bulging.     Ears:     Comments: No mastoid erythema/swellng/tenderness.     Nose:     Right Sinus: No maxillary sinus tenderness or frontal sinus tenderness.  Left Sinus: No maxillary sinus tenderness or frontal sinus tenderness.     Mouth/Throat:     Pharynx: Uvula midline. Oropharyngeal exudate, posterior oropharyngeal erythema and uvula swelling present.     Comments: Asymmetric swelling to the posterior oropharynx in the peritonsillar region as well as asymmetric tonsillar edema R>L. Hot potato voice. Patient tolerating own secretions without difficulty. No trismus. No drooling.  Eyes:     General:        Right eye: No discharge.        Left eye: No discharge.     Conjunctiva/sclera: Conjunctivae normal.  Cardiovascular:     Rate and Rhythm: Normal rate and regular rhythm.  Pulmonary:     Effort: Pulmonary effort is normal. No  respiratory distress.     Breath sounds: Normal breath sounds. No wheezing, rhonchi or rales.  Abdominal:     General: There is no distension.     Palpations: Abdomen is soft.     Tenderness: There is no abdominal tenderness.  Musculoskeletal:     Cervical back: Neck supple. No rigidity.  Lymphadenopathy:     Cervical: Cervical adenopathy (R anterior) present.  Skin:    General: Skin is warm and dry.     Findings: No rash.  Neurological:     Mental Status: He is alert.  Psychiatric:        Behavior: Behavior normal.     ED Results / Procedures / Treatments   Labs (all labs ordered are listed, but only abnormal results are displayed) Labs Reviewed  BASIC METABOLIC PANEL - Abnormal; Notable for the following components:      Result Value   Glucose, Bld 105 (*)    Creatinine, Ser 1.49 (*)    All other components within normal limits  CBC WITH DIFFERENTIAL/PLATELET - Abnormal; Notable for the following components:   WBC 16.3 (*)    Neutro Abs 13.6 (*)    Monocytes Absolute 1.6 (*)    Abs Immature Granulocytes 0.12 (*)    All other components within normal limits    EKG None  Radiology CT Soft Tissue Neck W Contrast  Result Date: 01/28/2020 CLINICAL DATA:  Epiglottitis or tonsillitis suspected. Additional history provided: Patient reports pain and difficulty swelling for 5 days, visible swelling on right side. EXAM: CT NECK WITH CONTRAST TECHNIQUE: Multidetector CT imaging of the neck was performed using the standard protocol following the bolus administration of intravenous contrast. CONTRAST:  29mL OMNIPAQUE IOHEXOL 300 MG/ML  SOLN COMPARISON:  No pertinent prior studies available for comparison. FINDINGS: Pharynx and larynx: There is marked prominence of the right palatine tonsil with associated mucosal/submucosal edema extending superiorly to the level of the nasopharynx as well as inferiorly. There is an associated multiloculated right peritonsillar abscess which measures  2.4 x 2.1 by 4.1 cm (series 2, image 41) (series 5, image 46). The peritonsillar abscess encroaches upon the right parapharyngeal space. There is extension of edema and/or early abscess on the right caudally to the level of the glottic larynx. At the level of the supraglottic and glottic larynx this measures 1.2 x 2.2 x 2.7 cm (series 2, image 61) (series 6, image 45). There is more generalized soft tissue swelling of the supraglottic larynx and epiglottis consistent with supraglottitis. Mild to moderate effacement of the oropharyngeal airway. There is also mild effacement of the laryngeal airway. There is a retropharyngeal effusion spanning the C2-C6 levels measuring up to 6 mm in AP dimension. No definite peripheral enhancement to suggest retropharyngeal  abscess on the current examination. Salivary glands: Asymmetric hyperenhancement of the right submandibular gland which may reflect reactive sialoadenitis. Thyroid: Negative Lymph nodes: Upper cervical chain lymphadenopathy (greater on the right), likely reactive. Vascular: The major vascular structures of the neck appear patent. Limited intracranial: No acute abnormality identified. Visualized orbits: Incompletely imaged. Visualized orbits demonstrate no acute abnormality. Mastoids and visualized paranasal sinuses: Small bilateral maxillary sinus mucous retention cysts. No significant mastoid effusion. Redemonstrated nonspecific chronic defect within the lateral aspect of the right mastoid air cells (series 2, image 16). Skeleton: No acute bony abnormality or aggressive osseous lesion. Upper chest: No consolidation within the imaged lung apices. Other: Associated generalized edema within the right neck. These results were called by telephone at the time of interpretation on 01/28/2020 at 1:48 pm to provider [facility] , who verbally acknowledged these results. IMPRESSION: Findings consistent with right tonsillitis as well pharyngitis and supraglottitis.  Associated 2.4 x 2.1 x 4.1 cm multiloculated right peritonsillar abscess. Extension of edema and/or early abscess caudally on the right to the level of the glottic larynx. At the level of the supraglottic and glottic larynx, this measures 1.2 x 2.2 x 2.7 cm. Associated retropharyngeal effusion measuring 6 mm in AP dimension. Mild to moderate effacement of the oropharyngeal airway with mild effacement of the laryngeal airway. Upper cervical chain lymphadenopathy which is likely reactive. Hyperenhancement of the right submandibular gland which may reflect reactive sialoadenitis. Electronically Signed   By: Jackey Loge DO   On: 01/28/2020 13:48    Procedures Procedures (including critical care time)  Medications Ordered in ED Medications - No data to display  ED Course  I have reviewed the triage vital signs and the nursing notes.  Pertinent labs & imaging results that were available during my care of the patient were reviewed by me and considered in my medical decision making (see chart for details).    Howard Pena was evaluated in Emergency Department on 01/28/2020 for the symptoms described in the history of present illness. He/she was evaluated in the context of the global COVID-19 pandemic, which necessitated consideration that the patient might be at risk for infection with the SARS-CoV-2 virus that causes COVID-19. Institutional protocols and algorithms that pertain to the evaluation of patients at risk for COVID-19 are in a state of rapid change based on information released by regulatory bodies including the CDC and federal and state organizations. These policies and algorithms were followed during the patient's care in the ED.  MDM Rules/Calculators/A&P                      Patient presents to the ED with complaints of sore throat for past few days. Asymmetric posterior orophraynx w/ R peritonsillar & tonsillar swelling as well as uvula swelling. Mild hot potato voice noted. Tolerating  own secretions without difficulty. Protecting airway. Plan for labs w/ CT soft tissue neck. Decadron & clindamycin ordered IV w/ fluids.   CBC w/ leukocytosis with left shift BMP notable for mild AKI w/ creatinine 1.49- fluids being given   CT soft tissue neck: Findings consistent with right tonsillitis as well pharyngitis and supraglottitis. Associated 2.4 x 2.1 x 4.1 cm multiloculated right peritonsillar abscess. Extension of edema and/or early abscess caudally on the right to the level of the glottic larynx. At the level of the supraglottic and glottic larynx, this measures 1.2 x 2.2 x 2.7 cm. Associated retropharyngeal effusion measuring 6 mm in AP dimension. Mild to moderate effacement of  the oropharyngeal airway with mild effacement of the laryngeal airway. Upper cervical chain lymphadenopathy which is likely reactive. Hyperenhancement of the right submandibular gland which may reflect reactive sialoadenitis--> findings were discussed w/ radiologist Dr. Armandina Gemma  Consult placed to ENT. On re-assessment patient reports minimal pain improvement, additional analgesics ordered, continues to protect his airway.   13:59: CONSULT: Discussed with Dr. Janace Hoard, ENT, recommends medical admission, will see patient in consultation. COVID test ordered.   14:33: CONSULT: Discussed with IM residency service- accept admission.   Findings and plan of care discussed with supervising physician Dr. Regenia Skeeter who has evaluated the patient & is in agreement.   Final Clinical Impression(s) / ED Diagnoses Final diagnoses:  Peritonsillar abscess  Abscess of supraglottic region  Tonsillitis  AKI (acute kidney injury) [facility])    Rx / DC Orders ED Discharge Orders    None       Amaryllis Dyke, PA-C 01/28/20 1558    Sherwood Gambler, MD 01/29/20 [phone]

## 2020-01-28 NOTE — Consult Note (Signed)
Reason for Consult:PTA Referring Physician: er  Howard Pena is an 24 y.o. male.  HPI: Patient is a 24 year old with a 1 week history of sore throat.  It is right-sided.  He does not have a chronic tonsillitis issue.  He has had increasing symptoms and presented to the emergency room with right-sided soreness decreased p.o. intake.  He was treated with antibiotics and steroids.  He has a CT scan that shows a right peritonsillar abscess that extends inferiorly to the epiglottis.  There is a little bit of possible effusion in the retropharynx.  He is not having any breathing problems.  His voice is slightly muffled.  Past Medical History:  Diagnosis Date  . Constitutional growth delay   . Goiter   . Physical growth delay   . Poor appetite   . Puberty delay     Past Surgical History:  Procedure Laterality Date  . right ear surgery      Family History  Problem Relation Age of Onset  . Thyroid disease Mother     Social History:  reports that he has been smoking. He has never used smokeless tobacco. He reports current alcohol use. He reports that he does not use drugs.  Allergies:  Allergies  Allergen Reactions  . Amoxicillin Other (See Comments)    Did it involve swelling of the face/tongue/throat, SOB, or low BP? Unknown Did it involve sudden or severe rash/hives, skin peeling, or any reaction on the inside of your mouth or nose? Unknown Did you need to seek medical attention at a hospital or doctor's office? Unknown When did it last happen?pt was a child If all above answers are "NO", may proceed with cephalosporin use.     Medications: I have reviewed the patient's current medications.  Results for orders placed or performed during the hospital encounter of 01/28/20 (from the past 48 hour(s))  Basic metabolic panel     Status: Abnormal   Collection Time: 01/28/20 11:07 AM  Result Value Ref Range   Sodium 138 135 - 145 mmol/L   Potassium 4.4 3.5 - 5.1 mmol/L    Chloride 98 98 - 111 mmol/L   CO2 27 22 - 32 mmol/L   Glucose, Bld 105 (H) 70 - 99 mg/dL   BUN 19 6 - 20 mg/dL   Creatinine, Ser 3.87 (H) 0.61 - 1.24 mg/dL   Calcium 56.4 8.9 - 33.2 mg/dL   GFR calc non Af Amer >60 >60 mL/min   GFR calc Af Amer >60 >60 mL/min   Anion gap 13 5 - 15    Comment: Performed at [facility], [address], [place], [place] [address]  CBC with Differential     Status: Abnormal   Collection Time: 01/28/20 11:07 AM  Result Value Ref Range   WBC 16.3 (H) 4.0 - 10.5 K/uL   RBC 5.34 4.22 - 5.81 MIL/uL   Hemoglobin 14.9 13.0 - 17.0 g/dL   HCT 41.6 60.6 - 30.1 %   MCV 87.5 80.0 - 100.0 fL   MCH 27.9 26.0 - 34.0 pg   MCHC 31.9 30.0 - 36.0 g/dL   RDW 60.1 09.3 - 23.5 %   Platelets 260 150 - 400 K/uL   nRBC 0.0 0.0 - 0.2 %   Neutrophils Relative % 83 %   Neutro Abs 13.6 (H) 1.7 - 7.7 K/uL   Lymphocytes Relative 6 %   Lymphs Abs 1.0 0.7 - 4.0 K/uL   Monocytes Relative 10 %   Monocytes  Absolute 1.6 (H) 0.1 - 1.0 K/uL   Eosinophils Relative 0 %   Eosinophils Absolute 0.0 0.0 - 0.5 K/uL   Basophils Relative 0 %   Basophils Absolute 0.0 0.0 - 0.1 K/uL   Immature Granulocytes 1 %   Abs Immature Granulocytes 0.12 (H) 0.00 - 0.07 K/uL    Comment: Performed at [facility], [address], [place], [place] [address]  Respiratory Panel by RT PCR (Flu A&B, Covid) - Nasopharyngeal Swab     Status: None   Collection Time: 01/28/20  2:02 PM   Specimen: Nasopharyngeal Swab  Result Value Ref Range   SARS Coronavirus 2 by RT PCR NEGATIVE NEGATIVE    Comment: (NOTE) SARS-CoV-2 target nucleic acids are NOT DETECTED. The SARS-CoV-2 RNA is generally detectable in upper respiratoy specimens during the acute phase of infection. The lowest concentration of SARS-CoV-2 viral copies this assay can detect is 131 copies/mL. A negative result does not preclude SARS-Cov-2 infection and should not be used as the sole basis for treatment or other patient management  decisions. A negative result may occur with  improper specimen collection/handling, submission of specimen other than nasopharyngeal swab, presence of viral mutation(s) within the areas targeted by this assay, and inadequate number of viral copies (<131 copies/mL). A negative result must be combined with clinical observations, patient history, and epidemiological information. The expected result is Negative. Fact Sheet for Patients:  https://www.moore.com/ Fact Sheet for Healthcare Providers:  https://www.young.biz/ This test is not yet ap proved or cleared by the [place] FDA and  has been authorized for detection and/or diagnosis of SARS-CoV-2 by FDA under an Emergency Use Authorization (EUA). This EUA will remain  in effect (meaning this test can be used) for the duration of the COVID-19 declaration under Section 564(b)(1) of the Act, 21 U.S.C. section 360bbb-3(b)(1), unless the authorization is terminated or revoked sooner.    Influenza A by PCR NEGATIVE NEGATIVE   Influenza B by PCR NEGATIVE NEGATIVE    Comment: (NOTE) The Xpert Xpress SARS-CoV-2/FLU/RSV assay is intended as an aid in  the diagnosis of influenza from Nasopharyngeal swab specimens and  should not be used as a sole basis for treatment. Nasal washings and  aspirates are unacceptable for Xpert Xpress SARS-CoV-2/FLU/RSV  testing. Fact Sheet for Patients: https://www.moore.com/ Fact Sheet for Healthcare Providers: https://www.young.biz/ This test is not yet approved or cleared by the [place] FDA and  has been authorized for detection and/or diagnosis of SARS-CoV-2 by  FDA under an Emergency Use Authorization (EUA). This EUA will remain  in effect (meaning this test can be used) for the duration of the  Covid-19 declaration under Section 564(b)(1) of the Act, 21  U.S.C. section 360bbb-3(b)(1), unless the authorization is   terminated or revoked. Performed at [facility], [address], [place], [place] [address]     CT Soft Tissue Neck W Contrast  Result Date: 01/28/2020 CLINICAL DATA:  Epiglottitis or tonsillitis suspected. Additional history provided: Patient reports pain and difficulty swelling for 5 days, visible swelling on right side. EXAM: CT NECK WITH CONTRAST TECHNIQUE: Multidetector CT imaging of the neck was performed using the standard protocol following the bolus administration of intravenous contrast. CONTRAST:  48mL OMNIPAQUE IOHEXOL 300 MG/ML  SOLN COMPARISON:  No pertinent prior studies available for comparison. FINDINGS: Pharynx and larynx: There is marked prominence of the right palatine tonsil with associated mucosal/submucosal edema extending superiorly to the level of the nasopharynx as well as inferiorly. There is  an associated multiloculated right peritonsillar abscess which measures 2.4 x 2.1 by 4.1 cm (series 2, image 41) (series 5, image 46). The peritonsillar abscess encroaches upon the right parapharyngeal space. There is extension of edema and/or early abscess on the right caudally to the level of the glottic larynx. At the level of the supraglottic and glottic larynx this measures 1.2 x 2.2 x 2.7 cm (series 2, image 61) (series 6, image 45). There is more generalized soft tissue swelling of the supraglottic larynx and epiglottis consistent with supraglottitis. Mild to moderate effacement of the oropharyngeal airway. There is also mild effacement of the laryngeal airway. There is a retropharyngeal effusion spanning the C2-C6 levels measuring up to 6 mm in AP dimension. No definite peripheral enhancement to suggest retropharyngeal abscess on the current examination. Salivary glands: Asymmetric hyperenhancement of the right submandibular gland which may reflect reactive sialoadenitis. Thyroid: Negative Lymph nodes: Upper cervical chain lymphadenopathy (greater on the right), likely  reactive. Vascular: The major vascular structures of the neck appear patent. Limited intracranial: No acute abnormality identified. Visualized orbits: Incompletely imaged. Visualized orbits demonstrate no acute abnormality. Mastoids and visualized paranasal sinuses: Small bilateral maxillary sinus mucous retention cysts. No significant mastoid effusion. Redemonstrated nonspecific chronic defect within the lateral aspect of the right mastoid air cells (series 2, image 16). Skeleton: No acute bony abnormality or aggressive osseous lesion. Upper chest: No consolidation within the imaged lung apices. Other: Associated generalized edema within the right neck. These results were called by telephone at the time of interpretation on 01/28/2020 at 1:48 pm to provider [facility] , who verbally acknowledged these results. IMPRESSION: Findings consistent with right tonsillitis as well pharyngitis and supraglottitis. Associated 2.4 x 2.1 x 4.1 cm multiloculated right peritonsillar abscess. Extension of edema and/or early abscess caudally on the right to the level of the glottic larynx. At the level of the supraglottic and glottic larynx, this measures 1.2 x 2.2 x 2.7 cm. Associated retropharyngeal effusion measuring 6 mm in AP dimension. Mild to moderate effacement of the oropharyngeal airway with mild effacement of the laryngeal airway. Upper cervical chain lymphadenopathy which is likely reactive. Hyperenhancement of the right submandibular gland which may reflect reactive sialoadenitis. Electronically Signed   By: Jackey Loge DO   On: 01/28/2020 13:48    Review of Systems Blood pressure 120/80, pulse (!) 106, temperature 99.3 F (37.4 C), temperature source Oral, resp. rate 16, height 5\' 8"  (1.727 m), weight 56 kg, SpO2 98 %. Physical Exam  Constitutional: He appears well-developed and well-nourished.  HENT:  He has no trismus of significance.  The right peritonsillar area is swollen and the tonsil was  definitely deviated medially.  The uvula has a significant amount of edematous swelling.  His tongue looks normal.  His incision and drainage procedure was performed without difficulty.  Eyes: Pupils are equal, round, and reactive to light. Conjunctivae are normal.  Musculoskeletal:     Cervical back: Normal range of motion and neck supple.    Assessment/Plan: Right peritonsillar abscess with extension-he is going to be admitted for intravenous antibiotics.  I discussed with him about incision and drainage of the right peritonsillar abscess.  This seems to be the nidus with inferior directed fluid.  We discussed the procedure including risk, benefits, and options.  All his questions were answered and consent was obtained.  The right tonsil and tongue area was sprayed with Cetacaine.  He was injected with 1% lidocaine with 1 100,000 epinephrine in the right peritonsillar region.  An incision was made with a 15 blade and then the tonsil hemostat was placed into the peritonsillar region and opened.  There was some purulent material and then the tonsil was directed inferiorly where it was also opened.  He tolerated this well.  There was no active significant bleeding.  He now will be on intravenous antibiotics to settle down the surrounding inflammatory response he has had which hopefully will not need any intervention.  Melissa Montane 01/28/2020, 4:56 PM

## 2020-01-28 NOTE — H&P (Signed)
Date: 01/28/2020               Patient Name:  Howard Pena MRN: [id number]  DOB: [birth year] Age / Sex: 24 y.o., male   PCP: Patient, No Pcp Per         Medical Service: Internal Medicine Teaching Service         Attending Physician: Dr. Pricilla Loveless, MD    First Contact: Dr. Barbaraann Faster Pager: [phone]  Second Contact: Dr. Gwyneth Revels  Pager: [phone]       After Hours (After 5p/  First Contact Pager: [phone]  weekends / holidays): Second Contact Pager: [phone]   Chief Complaint:   History of Present Illness: Howard Pena is a 24 year old person with past medical history of seizure disorder here for evaluation of sore throat.  Patient reports a sore throat started about four days and he woke up with it swollen 2 days ago. It is mostly swollen on his right side and he can feel his Uvula.  He has had nights sweats , but denies fevers or chills. He has been unable to eat or take his Keppra. He had a allergic reaction to something at [employer] once and this caused his throat to become swollen.  Otherwise he denies any similar episodes. Denies tooth pain, any recent infection , sick contacts,or recurrent pharyngitis in the past. Reports he received childhood vaccines.  Meds:  Current Meds  Medication Sig  . ibuprofen (ADVIL) 800 MG tablet Take 800 mg by mouth 3 (three) times daily.  Marland Kitchen levETIRAcetam (KEPPRA) 750 MG tablet Take 1 tablet (750 mg total) by mouth 2 (two) times daily.  Marland Kitchen levofloxacin (LEVAQUIN) 750 MG tablet Take 750 mg by mouth daily.     Allergies: Allergies as of 01/28/2020 - Review Complete 01/28/2020  Allergen Reaction Noted  . Amoxicillin Other (See Comments) 04/21/2011   Past Medical History:  Diagnosis Date  . Constitutional growth delay   . Goiter   . Physical growth delay   . Poor appetite   . Puberty delay     Family History:  Family History  Problem Relation Age of Onset  . Thyroid disease Mother      Social History:  Social History   Tobacco  Use  . Smoking status: Current Every Day Smoker  . Smokeless tobacco: Never Used  Substance Use Topics  . Alcohol use: Yes    Comment: occasional  . Drug use: No      Review of Systems: A complete ROS was negative except as per HPI.   Physical Exam: Blood pressure 135/78, pulse 91, temperature 99.3 F (37.4 C), temperature source Oral, resp. rate 16, height 5\' 8"  (1.727 m), weight 56 kg, SpO2 98 %.   General: Alert, nl appearance, obese , NAD HE: Normocephalic, atraumatic.  EN: EOMI, Conjunctivae normal, No congestion. Loose skin around neck T: Asymmetric swelling of the posterior oropharynx and tonsils, R>L Uvula is swollen and deviated to the right. No frank pus,  Cardiovascular:Tachycadic, regular rhythm.  No murmurs, rubs, or gallops Pulmonary : Effort normal, breath sounds normal. No wheezes, rales, or rhonchi Abdominal: soft, nontender, no mass, no distention, no guarding, no rebound,  bowel sounds normal Musculoskeletal: no swelling , deformity, injury ,or tenderness in extremities, Skin: Warm, dry , no bruising, erythema, or rash Neurological: alert and oriented x4 , no focal deficits  Psychiatric/Behavioral:  normal mood, normal behavior   CT Soft Tissue Neck W Contrast  IMPRESSION: Findings consistent with right tonsillitis as  well pharyngitis and supraglottitis. Associated 2.4 x 2.1 x 4.1 cm multiloculated right peritonsillar abscess. Extension of edema and/or early abscess caudally on the right to the level of the glottic larynx. At the level of the supraglottic and glottic larynx, this measures 1.2 x 2.2 x 2.7 cm. Associated retropharyngeal effusion measuring 6 mm in AP dimension. Mild to moderate effacement of the oropharyngeal airway with mild effacement of the laryngeal airway.  Upper cervical chain lymphadenopathy which is likely reactive.  Hyperenhancement of the right submandibular gland which may reflect reactive sialoadenitis.   Assessment &  Plan by Problem: Active Problems:   * No active hospital problems. *  Howard Pena has a past medical history of seizure disorder here for evaluation of sore throat and found to have right multiloculated peritonsillar abscess 2.4 x 2.1 x 4.1 cm .   #Peritonsillar abscess Hot potatoe voice on exam, patient drooling. No trismus. CT Neck shows peritonsillar abscess. WBC 16.3 with left shift. Tmax 99.3 .  -Dr.Byers , ENT , was consulted in ED. Given one dose of decadron in ED. Passed off from ED, plan for ENT to evaluate for drainage bedside or in OR.  P: - Clindamycin - ENT consulted   #History of seizures  Patient presented to the emergency department in September 2020 after his first seizure.  He reports he has had 4 total episodes. 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 Patient having difficulty swallowing and has not taken his antiepileptic in a few days. P: IV Keppra 750 mg BID  #AKI Previous Cr .91, Cr on admission 1.49. Suspect this is prerenal in setting of no PO intake for 2 days. Patient endorses thirst and borderline tachycardic on exam - IV NS 150 ml/hr x 10 hrs     Dispo: Admit patient to Observation with expected length of stay less than 2 midnights.  Signed: .Tamsen Snider, MD PGY1  See Attending Attestation Note for Final Recommendations.

## 2020-01-28 NOTE — ED Notes (Signed)
MD at bedside. 

## 2020-01-29 DIAGNOSIS — J36 Peritonsillar abscess: Secondary | ICD-10-CM | POA: Diagnosis not present

## 2020-01-29 DIAGNOSIS — Z79899 Other long term (current) drug therapy: Secondary | ICD-10-CM

## 2020-01-29 DIAGNOSIS — G40909 Epilepsy, unspecified, not intractable, without status epilepticus: Secondary | ICD-10-CM | POA: Diagnosis not present

## 2020-01-29 DIAGNOSIS — J0430 Supraglottitis, unspecified, without obstruction: Secondary | ICD-10-CM

## 2020-01-29 DIAGNOSIS — Z881 Allergy status to other antibiotic agents status: Secondary | ICD-10-CM

## 2020-01-29 LAB — CBC
HCT: 44.1 % (ref 39.0–52.0)
Hemoglobin: 14.3 g/dL (ref 13.0–17.0)
MCH: 28.2 pg (ref 26.0–34.0)
MCHC: 32.4 g/dL (ref 30.0–36.0)
MCV: 87 fL (ref 80.0–100.0)
Platelets: 271 10*3/uL (ref 150–400)
RBC: 5.07 MIL/uL (ref 4.22–5.81)
RDW: 12.3 % (ref 11.5–15.5)
WBC: 15.1 10*3/uL — ABNORMAL HIGH (ref 4.0–10.5)
nRBC: 0 % (ref 0.0–0.2)

## 2020-01-29 LAB — BASIC METABOLIC PANEL
Anion gap: 12 (ref 5–15)
BUN: 15 mg/dL (ref 6–20)
CO2: 24 mmol/L (ref 22–32)
Calcium: 9.7 mg/dL (ref 8.9–10.3)
Chloride: 102 mmol/L (ref 98–111)
Creatinine, Ser: 0.94 mg/dL (ref 0.61–1.24)
GFR calc Af Amer: >60 mL/min (ref >60)
GFR calc non Af Amer: >60 mL/min (ref >60)
Glucose, Bld: 100 mg/dL — ABNORMAL HIGH (ref 70–99)
Potassium: 4.2 mmol/L (ref 3.5–5.1)
Sodium: 138 mmol/L (ref 135–145)

## 2020-01-29 MED ORDER — METHYLPREDNISOLONE 4 MG PO TBPK: 4.0000 mg | ORAL_TABLET | Freq: Three times a day (TID) | ORAL | Status: DC

## 2020-01-29 MED ORDER — METHYLPREDNISOLONE 4 MG PO TBPK
4.0000 mg | ORAL_TABLET | ORAL | Status: AC
  Administered 2020-01-29: 16:00:00 4 mg via ORAL
  Filled 2020-01-29: qty 21

## 2020-01-29 MED ORDER — METHYLPREDNISOLONE 4 MG PO TBPK: 8.0000 mg | ORAL_TABLET | Freq: Every evening | ORAL | Status: DC

## 2020-01-29 MED ORDER — METHYLPREDNISOLONE 4 MG PO TBPK
8.0000 mg | ORAL_TABLET | Freq: Every morning | ORAL | Status: AC
  Administered 2020-01-29: 16:00:00 8 mg via ORAL
  Filled 2020-01-29: qty 21

## 2020-01-29 MED ORDER — METHYLPREDNISOLONE 4 MG PO TBPK: 4.0000 mg | ORAL_TABLET | ORAL | Status: DC

## 2020-01-29 MED ORDER — METHYLPREDNISOLONE 4 MG PO TBPK: ORAL_TABLET | ORAL | 0 refills | Status: AC

## 2020-01-29 MED ORDER — METHYLPREDNISOLONE 4 MG PO TBPK: 4.0000 mg | ORAL_TABLET | Freq: Four times a day (QID) | ORAL | Status: DC

## 2020-01-29 MED ORDER — CLINDAMYCIN HCL 300 MG PO CAPS: 300.0000 mg | ORAL_CAPSULE | Freq: Three times a day (TID) | ORAL | 0 refills | Status: AC

## 2020-01-29 NOTE — Discharge Summary (Signed)
Name: Howard Pena MRN: [id number] DOB: February 16, 1996 23 y.o. PCP: Patient, No Pcp Per  Date of Admission: 01/28/2020 10:22 AM Date of Discharge: 01/29/2020 Attending Physician: Earl Lagos, MD  Discharge Diagnosis: 1. Peritonsilar abscess  Discharge Medications: Allergies as of 01/29/2020      Reactions   Amoxicillin Other (See Comments)   Did it involve swelling of the face/tongue/throat, SOB, or low BP? Unknown Did it involve sudden or severe rash/hives, skin peeling, or any reaction on the inside of your mouth or nose? Unknown Did you need to seek medical attention at a hospital or doctor's office? Unknown When did it last happen?pt was a child If all above answers are "NO", may proceed with cephalosporin use.      Medication List    STOP taking these medications   levofloxacin 750 MG tablet Commonly known as: LEVAQUIN     TAKE these medications   clindamycin 300 MG capsule Commonly known as: CLEOCIN Take 1 capsule (300 mg total) by mouth 3 (three) times daily for 9 days.   ibuprofen 800 MG tablet Commonly known as: ADVIL Take 800 mg by mouth 3 (three) times daily.   levETIRAcetam 750 MG tablet Commonly known as: KEPPRA Take 1 tablet (750 mg total) by mouth 2 (two) times daily.   methylPREDNISolone 4 MG Tbpk tablet Commonly known as: MEDROL DOSEPAK On day 1 take 24mg  total (6 pills total, can take 2 pills with breakfast, lunch, and dinner) On day 2 take 20mg  total (5 pills total, can take 2 pills with breakfast and lunch, 1 pill with dinner) On day 3 take 16 mg total (4 pills total, can take 2 pills with breakfast and lunch) On day 4 take 12 mg total (3 pills total, can take 2 pills with breakfast and 1 pill with lunch) On day 5 take 8 mg total (2 pills total, can take both with breakfast) On day 6 take 4 mg total (1 pill with breakfast)     Disposition and follow-up:   [name] was discharged from [facility] in Stable  condition.  At the hospital follow up visit please address:  1.  Peritonsilar abscess. Ensure the patient is followed up with ENT.  2.  Labs / imaging needed at time of follow-up: None  3.  Pending labs/ test needing follow-up: None  Follow-up Appointments: Follow-up Information    Laural Benes, MD. Schedule an appointment as soon as possible for a visit in 1 week(s).   Specialty: Otolaryngology Why: Make follow up appointment with Dr[facility] in 7-10 days of discharge. Contact information: [address] [place] [address] [place] [phone]         Hospital Course by problem list:  Peritonsilar abscess. Howard Pena has a past medical history of seizure disorder here for evaluation of sore throat and found to have a right multiloculated peritonsillar abscess 2.4 x 2.1 x 4.1 cm status post I&D with ENT on 2/9. He was admitted to the hospital for overnight observation. He remained afebrile and is leukocytosis down trended. He was transition to PO clindamycin and was tolerating PO intake appropriately. He was discharged with 14 days of clindamycin and a methylprednisolone dose pack. Follow-up with ENT in one week. He will follow-up with the internal medicine clinic.  Discharge Vitals:   BP 129/70 (BP Location: Left Arm)   Pulse 77   Temp 99 F (37.2 C) (Oral)   Resp 16   Ht 5\' 8"  (1.727 m)  Wt 56 kg   SpO2 100%   BMI 18.77 kg/m   Pertinent Labs, Studies, and Procedures:   CT Soft Tissue Neck with Contrast  Findings consistent with right tonsillitis as well pharyngitis and supraglottitis. Associated 2.4 x 2.1 x 4.1 cm multiloculated right peritonsillar abscess. Extension of edema and/or early abscess caudally on the right to the level of the glottic larynx. At the level of the supraglottic and glottic larynx, this measures 1.2 x 2.2 x 2.7 cm. Associated retropharyngeal effusion measuring 6 mm in AP dimension. Mild to moderate effacement of the oropharyngeal  airway with mild effacement of the laryngeal airway.  Upper cervical chain lymphadenopathy which is likely reactive.  Hyperenhancement of the right submandibular gland which may reflect reactive sialoadenitis.  Discharge Instructions: Discharge Instructions    Call MD for:  difficulty breathing, headache or visual disturbances   Complete by: As directed    Call MD for:  extreme fatigue   Complete by: As directed    Call MD for:  hives   Complete by: As directed    Call MD for:  persistant dizziness or light-headedness   Complete by: As directed    Call MD for:  persistant nausea and vomiting   Complete by: As directed    Call MD for:  redness, tenderness, or signs of infection (pain, swelling, redness, odor or green/yellow discharge around incision site)   Complete by: As directed    Call MD for:  severe uncontrolled pain   Complete by: As directed    Call MD for:  temperature >100.4   Complete by: As directed    Diet - low sodium heart healthy   Complete by: As directed    Discharge instructions   Complete by: As directed    [name], Howard Pena were admitted for a peritonsillar abscess that was drained during your stay. You were started on antibiotics. On discharge, please continue to take the antibiotic (clindamycin) three times daily. You are also being started on a steroid taper to help control inflammation. Please take as prescribed.  Please schedule an appointment with Dr. Janace Hoard, ENT in 7-10 days following discharge.  Please continue to take all other medications as prescribed. Thank you!   Increase activity slowly   Complete by: As directed     Signed: Ina Homes, MD 01/29/2020, 2:08 PM

## 2020-01-29 NOTE — Discharge Instructions (Signed)
RINSE INSTRUCTIONS  Irrigate your mouth with the following mixture 3 times daily for the next 5 days:  1/2 cup of warm water  1 teaspoon of salt  1/2 teaspoon of honey  Eat a soft diet for the next few days. No hard/crunchy foods or foods with sharp edges.  Your pain should continue to improve over the next couple of days.  Take tylenol and/or ibuprofen as needed for pain. Follow the package directions.  Take a probiotic, such as yogurt with active cultures, while taking your antibiotic. Take your antibiotic until the treatment is complete. Do not stop early.   Return to the ER if you experience any difficulty breathing or a significant bleeding event. Call the clinic if you have other concerns.      Peritonsillar Abscess A peritonsillar abscess is an infected area in your throat that is filled with pus. It forms behind your tonsils. This may be treated by:  Draining the pus. Your doctor may do this with a syringe and a needle (needle aspiration) or by making a cut in the abscess.  Using antibiotic medicine. Follow these instructions at home: Medicines  Take over-the-counter and prescription medicines only as told by your doctor.  If you were prescribed an antibiotic, take it as told by your doctor. Do not stop taking the antibiotic even if you start to feel better. Eating and drinking   Drink enough fluid to keep your pee (urine) pale yellow.  While your throat is sore, try one of these: ? Only drinking liquids. ? Eating only soft foods, such as yogurt and ice cream. General instructions  Rest as much as you can. Get plenty of sleep.  Return to your normal activities as told by your doctor. Ask your doctor what activities are safe for you.  If your abscess was drained, gargle with a salt-water mixture 3-4 times a day or as needed. ? To make a salt-water mixture, completely dissolve -1 tsp of salt in 1 cup of warm water. ? Do not swallow this mixture.  Do not use  any products that have nicotine or tobacco in them. These include cigarettes and e-cigarettes. If you need help quitting, ask your doctor.  Keep all follow-up visits as told by your doctor. This is important. Contact a doctor if you have:  More pain, swelling, redness, or pus in your throat.  A headache.  Low energy (lethargy).  A general feeling of illness (malaise).  A fever.  Dizziness.  Trouble swallowing.  Trouble eating.  Signs of body fluid loss (dehydration), such as: ? Feeling light-headed when you are standing. ? Peeing (urinating) less than usual. ? A fast heart rate. ? Dry mouth. Get help right away if you:  Have trouble talking.  Have trouble breathing.  Breathing is easier when you lean forward.  Cough up blood.  Throw up (vomit) blood.  Have very bad throat pain and it does not get better with medicine. Summary  A peritonsillar abscess is an infected area in your throat that is filled with pus.  You may be treated by having the abscess drained and by taking antibiotic medicine.  Contact a doctor if you have trouble swallowing or eating.  Get help right away if you cough up blood or see blood when you throw up (vomit). This information is not intended to replace advice given to you by your health care provider. Make sure you discuss any questions you have with your health care provider. Document Revised: 11/17/2017 Document  Reviewed: 10/17/2017 Elsevier Patient Education  [employer].

## 2020-01-29 NOTE — Progress Notes (Signed)
  Date: 01/29/2020  Patient name: Howard Pena  Medical record number: [id number]  Date of birth: November 17, 1996   I have seen and evaluated Helmut Muster and discussed their care with the Residency Team.  In brief, patient is a 24 year old male with a past medical history of seizure disorder who presented to the ED with sore throat and difficulty swallowing.  Patient states that approximately 4 days ago he developed a sore throat which has progressively worsened.  Patient states that the right side of his face and throat started swelling and he had difficulty swallowing secondary to pain.  States that he has not eaten anything in the last 24 to 48 hours prior to admission secondary to pain.  Also complained of some associated diaphoresis but denied any fevers or chills.  No chest pain, no shortness of breath, no palpitations, no headache, no blurry vision, no focal weakness, no tingling or numbness.  In the ED he was noted to have right-sided tonsillitis, pharyngitis and supraglottitis with associated multiloculated right peritonsillar abscess on CT of his neck.  Today patient states that his pain is much improved and he feels much better overall.  PMHx, Fam Hx, and/or Soc Hx : As per resident note  Vitals:   01/29/20 0627 01/29/20 1407  BP: 129/70 120/77  Pulse: 77 69  Resp: 16   Temp: 99 F (37.2 C) 98.4 F (36.9 C)  SpO2: 100% 99%   General: Awake, alert, x3, NAD CVS: Regular rhythm, normal heart sounds Lungs: CTA bilaterally Abdomen: Soft, nontender, nondistended, normoactive bowel sounds Extremities: No edema noted, nontender to palpation HEENT: Mild purulent drainage noted in the right peritonsillar region, no tenderness to palpation over right side of his neck or face or submandibular region, mild pharyngeal erythema noted as well Psych: Normal affect Skin: Warm and dry  Assessment and Plan: I have seen and evaluated the patient as outlined above. I agree with the formulated  Assessment and Plan as detailed in the residents' note, with the following changes:   1.  Right peritonsillar abscess with associated pharyngitis and supraglottitis: -Patient initially presented to the ED with worsening sore throat and odynophagia over the last 4 days and was noted to have right-sided peritonsillar abscess with associated pharyngitis and supraglottitis on CT of his neck. -ENT follow-up and recommendations appreciated.  Patient is status post I&D of right peritonsillar abscess with improvement of his symptoms. -Continue with Medrol Dosepak for now -Complete 10-day course of clindamycin.  Will transition to oral clindamycin today -Patient follow-up with ENT in 1 week -Advance diet to soft diet today -No further work-up at this time.  We will have patient follow-up in [facility] as an outpatient -Patient stable for DC home today  Earl Lagos, MD 2/10/20212:27 PM

## 2020-01-29 NOTE — Progress Notes (Signed)
   Subjective:  Patient notes feeling better, says he can talk more fluent , and also breath better. He notes he hasn't eaten yet. He says he noticed nodule I asked him about on admission when looking in mirror this morning. He hasn't noticed it before, but is not painful.   Objective:  Vital signs in last 24 hours: Vitals:   01/28/20 1733 01/28/20 2038 01/29/20 0139 01/29/20 0627  BP: 128/75 110/71 116/77 129/70  Pulse: 89 77 78 77  Resp: 16 16 16 16   Temp: 97.9 F (36.6 C) (!) 97.5 F (36.4 C) 99.1 F (37.3 C) 99 F (37.2 C)  TempSrc: Oral Oral Oral Oral  SpO2: 100% 100% 100% 100%  Weight:      Height:       Gen: alert, nl appearance HEENT: EOMI, Edema is decreased, incision on right peritonsillar region with some purelent drainage.  Cardiovascular : Regular rate and rhythm no murmurs rubs or gallops Pulmonary clear to auscultation bilaterally, no wheezes rales or rhonchi  Assessment/Plan:  Active Problems:   Peritonsillar abscess  Howard Pena has a past medical history of seizure disorder here for evaluation of sore throat and found to have a right multiloculated peritonsillar abscess 2.4 x 2.1 x 4.1 cm status post I&D with ENT.  Peritonsillar abscess  - I&D on 2/9.  - Afebrile overnight. Leukocytosis trending 16.3 >15.1 - f/u blood cultures, NGTD  - ENT consulted , greatly appreciate the consult.  Will touch base with ENT before discharging patient.  - Possible discharge today if patient is tolerating p.o. intake. -Continue clindamycin, 14 day course to end on 2/23   #History of seizures -Continue IV Keppra 750 mg twice daily  #AKI - Resolved with IV fluids.   Prior to Admission Living Arrangement: home Anticipated Discharge Location: home Barriers to Discharge: no barriers  Dispo: Anticipated discharge in approximately 0-1 day(s).   3/23, MD PGY1  See Attending Attestation Note for Final Recommendations.

## 2020-02-02 LAB — CULTURE, BLOOD (ROUTINE X 2)
Culture: NO GROWTH
Culture: NO GROWTH
Special Requests: ADEQUATE
Special Requests: ADEQUATE

## 2020-02-04 ENCOUNTER — Telehealth: Payer: Self-pay | Admitting: Neurology

## 2020-02-04 NOTE — Telephone Encounter (Signed)
no to the covid questions MR Brain w/wo contrast Dr. Vickey Huger UMR Auth: [id number] (exp. 02/04/20 to 03/04/20). Patient is scheduled at [facility] for 02/12/20.

## 2020-02-07 ENCOUNTER — Ambulatory Visit: Payer: Commercial Managed Care - PPO | Admitting: Diagnostic Neuroimaging

## 2020-02-07 DIAGNOSIS — G40802 Other epilepsy, not intractable, without status epilepticus: Secondary | ICD-10-CM | POA: Insufficient documentation

## 2020-02-07 DIAGNOSIS — G4726 Circadian rhythm sleep disorder, shift work type: Secondary | ICD-10-CM | POA: Insufficient documentation

## 2020-02-07 NOTE — Progress Notes (Signed)
Howard Pena is a 24 y.o. 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 He presented to the ED on 09-08-19 and was  referred to neurology. He had 4 spells total, all related to  sleep.   IMPRESSION:  1. No evidence of Obstructive Sleep Apnea (OSA) 2. Sleep EEG was very fragmented, only briefly was REM sleep recorded. 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 We need a full EEG to be performed, with sleep onset.   RECOMMENDATIONS:  3. We need a full EEG to be performed, with sleep onset.  MRI was ordered, is pending.    I certify that I have reviewed the entire raw data recording prior to the issuance of this report in accordance with the Standards of Accreditation of the American Academy of Sleep Medicine (AASM)   Melvyn Novas, MD

## 2020-02-07 NOTE — Procedures (Signed)
PATIENT'S NAME:  Howard, Pena DOB:      [birth year]      MR#:    [id number]     DATE OF RECORDING: 01/26/2020  [facility] REFERRING M.D.:  Lorre Nick, MD Study Performed:   Expanded EEG/ Seizure Protocol Polysomnogram HISTORY:  Howard Pena is a 24 y.o. year old African American male patient who works third shift He was seen here on 01/22/2020 for a seizure work up. He presented to the ED on 09-08-19 and was referred to neurology. He had 4 spells total, all related to sleep.  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  He was asleep in the afternoon, which is his usual time to rest after work, until his very first seizure occurred at about 4 or 4:30 PM.  Since then all other spells have also occurred out of sleep and has been witnessed by his male partner. He has not woken up from the spells directly, and has noticed frothy discharge at the mouth and most recently bleeding due to a tongue bite. When he finally came to awareness his body was sore and achy, he was confused and felt exhausted completely fatigued.  The most recent spell was also followed by a headache. One of the times that he had 2 spells in short succession and has not been quite sure how long each spell lasted -but he seemed to have been snoring loudly and was very difficult to arouse. He was unresponsive.  Heavy or labored breathing is a common side effect of post ictal recovery. He was very dizzy and nauseated after his seizure, symptoms lasted for a day  Chief concern according to patient: sleep time seizures/ night shift worker-.   The patient's weight 125 pounds with a height of 68 (inches), resulting in a BMI of 19.1 kg/m2.  The patient's neck circumference measured 14 inches.  CURRENT MEDICATIONS: Keppra 750 mg bid   PROCEDURE:  This is a multichannel digital polysomnogram utilizing the Somnostar 11.2 system.  Electrodes and sensors were applied and monitored per AASM Specifications.    EEG, EOG, Chin and Limb EMG, were sampled at 200 Hz.  ECG, Snore and Nasal Pressure, Thermal Airflow, Respiratory Effort, CPAP Flow and Pressure, Oximetry was sampled at 50 Hz. Digital video and audio were recorded.      BASELINE STUDY: Lights Out was at 20:58 and Lights On at 04:53.  Total recording time (TRT) was 475.5 minutes, with a total sleep time (TST) of 206.5 minutes.  The patient's sleep latency was 95.5 minutes.  REM latency was 157.5 minutes.  The sleep efficiency was 43.4 %.     SLEEP ARCHITECTURE: WASO (Wake after sleep onset) was 260 minutes.  There were 21 minutes in Stage N1, 174.5 minutes Stage N2, 5.5 minutes Stage N3 and 5.5 minutes in Stage REM.  The percentage of Stage N1 was 10.2%, Stage N2 was 84.5%, Stage N3 was 2.7% and Stage R (REM sleep) was 2.7%.   RESPIRATORY ANALYSIS:  There were a total of 10 respiratory events:  5 obstructive apneas, 0 central apneas and 0 mixed apneas with a total of 5 apneas and an apnea index (AI) of 1.5 /hour. There were 5 hypopneas with a hypopnea index of 1.5 /hour. The patient also had 0 respiratory event related arousals (RERAs).     The total APNEA/HYPOPNEA INDEX (AHI) was 2.9/hour.  0 events occurred in REM sleep and 10 events in NREM. The REM AHI was 0.0 /hour,  versus a non-REM AHI of 3.0/h. The patient spent 54 minutes of total sleep time in the supine position and 153 minutes in non-supine. The supine sleep AHI per hour was 1.1 versus a non-supine AHI of 3.6/h.  OXYGEN SATURATION & C02:  The Wake baseline 02 saturation was 98%, with the lowest being 83%. Time spent below 89% saturation equaled 12 minutes.  The arousals were noted as: 173 were spontaneous, 14 were associated with PLMs, 6 were associated with respiratory events. The patient had a total of 18 Periodic Limb Movements.   Audio and video analysis did not show any abnormal or unusual movements, behaviors, phonations or vocalizations.  The patient was described as very restless,  and sleep efficiency was low - he was here at night time, but is a daytime sleeper. EKG was in keeping with normal sinus rhythm (NSR). Post-study, the patient indicated that sleep was the same as usual.    IMPRESSION:  1. No evidence of Obstructive Sleep Apnea (OSA) 2. Sleep EEG was very fragmented, only briefly was REM sleep recorded. 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 We need a full EEG to be performed, with sleep onset.   RECOMMENDATIONS:  3. We need a full EEG to be performed, with sleep onset.   I certify that I have reviewed the entire raw data recording prior to the issuance of this report in accordance with the Standards of Accreditation of the American Academy of Sleep Medicine (AASM)   Larey Seat, MD Diplomat, American Board of Psychiatry and Neurology  Diplomat, American Board of Sleep Medicine Market researcher, [facility] [employer]

## 2020-02-11 ENCOUNTER — Telehealth: Payer: Self-pay | Admitting: Neurology

## 2020-02-11 NOTE — Telephone Encounter (Signed)
-----   Message from Melvyn Novas, MD sent at 02/07/2020  3:14 PM EST ----- Howard Pena is a 24 y.o. 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 He presented to the ED on 09-08-19 and was  referred to neurology. He had 4 spells total, all related to  sleep.   IMPRESSION:  1. No evidence of Obstructive Sleep Apnea (OSA) 2. Sleep EEG was very fragmented, only briefly was REM sleep recorded. 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 We need a full EEG to be performed, with sleep onset.   RECOMMENDATIONS:  3. We need a full EEG to be performed, with sleep onset.  MRI was ordered, is pending.    I certify that I have reviewed the entire raw data recording prior to the issuance of this report in accordance with the Standards of Accreditation of the American Academy of Sleep Medicine (AASM)   Melvyn Novas, MD

## 2020-02-11 NOTE — Telephone Encounter (Signed)
Called patient to discuss sleep study results. No answer at this time. LVM for the patient to call back.   

## 2020-02-11 NOTE — Telephone Encounter (Signed)
Pt has called Casey,RN back.  Please call 

## 2020-02-11 NOTE — Telephone Encounter (Signed)
Called the patient and advisedhim of the sleep study finding. Advised that there was no evidence of sleep apnea present. There was no oxygen or heart rate concern. EEG was very fragmented but she would like to complete a 90 min EEG with hopes that patient may have sleep onset during the EEG study.  We have scheduled that for march 11,2021 and his MRI is scheduled for tomorrow 2/24. Once we have that completed them both then the work up will at that point be completed.

## 2020-02-12 ENCOUNTER — Other Ambulatory Visit: Payer: Self-pay | Admitting: Neurology

## 2020-02-12 ENCOUNTER — Other Ambulatory Visit: Payer: Self-pay

## 2020-02-12 ENCOUNTER — Ambulatory Visit: Payer: Commercial Managed Care - PPO

## 2020-02-12 DIAGNOSIS — G40802 Other epilepsy, not intractable, without status epilepticus: Secondary | ICD-10-CM | POA: Diagnosis not present

## 2020-02-12 MED ORDER — GADOBENATE DIMEGLUMINE 529 MG/ML IV SOLN: 12.0000 mL | Freq: Once | INTRAVENOUS | Status: AC | PRN

## 2020-02-18 ENCOUNTER — Telehealth: Payer: Self-pay | Admitting: Neurology

## 2020-02-18 NOTE — Telephone Encounter (Signed)
-----   Message from Melvyn Novas, MD sent at 02/14/2020  1:31 PM EST ----- Normal MRI brain without gadolinium.

## 2020-02-18 NOTE — Telephone Encounter (Signed)
Called to advised the patient of the normal MRI finding. There was no answer. LVM informing the patient of this information.  If pt calls back MRI was normal. I will be in touch with the patient once I have the EEG results.

## 2020-02-26 ENCOUNTER — Other Ambulatory Visit: Payer: Self-pay

## 2020-02-26 ENCOUNTER — Ambulatory Visit: Payer: Commercial Managed Care - PPO | Admitting: Neurology

## 2020-02-26 DIAGNOSIS — G4726 Circadian rhythm sleep disorder, shift work type: Secondary | ICD-10-CM

## 2020-02-26 DIAGNOSIS — G40802 Other epilepsy, not intractable, without status epilepticus: Secondary | ICD-10-CM

## 2020-02-27 ENCOUNTER — Other Ambulatory Visit: Payer: Self-pay

## 2020-03-05 ENCOUNTER — Telehealth: Payer: Self-pay | Admitting: Neurology

## 2020-03-05 NOTE — Telephone Encounter (Signed)
EEG - This prolonged EEG was performed over a duration of 1 hour and 3 minutes.  Study date was 26 February 2020.  This EEG includes awake and asleep stages hyperventilation and photic stimulation maneuvers.  This recording begins with the patient in awake state.  The background has symmetric, well-formed anterior to posterior gradient with well-regulated posterior dominant rhythms of 10 Hz.  The background amplitude ranges between 30 and 60 V.  The EKG remains in normal sinus rhythm between 58 and 63 bpm.  Hyperventilation is not performed which includes amplitude buildup and slight slowing of the background frequencies into the 8 Hz range.  After hyperventilation is concluded I noticed some sharp waves over CZ F3 and F4 which may indicate that the patient became drowsy.  Within 2 minutes post hyperventilation there again bifrontal discharges that indicate drowsiness.  After 2 minutes photic stimulation is performed which leads to a brisk response at all photic stimulation frequencies.    No epileptiform activity results, and after the photic stimulation maneuver is concluded, there is clear evidence of drowsiness and finally temporal spindles, posterior slowing and Cz discharges indicate sleep stages 1 and 2.    Conclusion: This is a prolonged EEG recording that indicates a tendency to sleepiness, brisk photic entrainment and symmetric bifrontal discharges before hyperventilation maneuvers and after hyperventilation maneuver.  These do not appear epileptiform,   Findings; symmetric and  normal EEG   Common Chamia Schmutz MD

## 2020-03-05 NOTE — Progress Notes (Signed)
EEG - This prolonged EEG was performed over a duration of 1 hour and 3 minutes.  Study date was 26 February 2020.  This EEG includes awake and asleep stages hyperventilation and photic stimulation maneuvers.  This recording begins with the patient in awake state.  The background has symmetric, well-formed anterior to posterior gradient with well-regulated posterior dominant rhythms of 10 Hz.  The background amplitude ranges between 30 and 60 V.  The EKG remains in normal sinus rhythm between 58 and 63 bpm.  Hyperventilation is not performed which includes amplitude buildup and slight slowing of the background frequencies into the 8 Hz range.  After hyperventilation is concluded I noticed some sharp waves over CZ F3 and F4 which may indicate that the patient became drowsy.  Within 2 minutes post hyperventilation there again bifrontal discharges that indicate drowsiness.  After 2 minutes photic stimulation is performed which leads to a brisk response at all photic stimulation frequencies.    No epileptiform activity results, and after the photic stimulation maneuver is concluded, there is clear evidence of drowsiness and finally temporal spindles, posterior slowing and Cz discharges indicate sleep stages 1 and 2.    Conclusion: This is a prolonged EEG recording that indicates a tendency to sleepiness, brisk photic entrainment and symmetric bifrontal discharges before hyperventilation maneuvers and after hyperventilation maneuver.  These do not appear epileptiform,   Findings; symmetric and  normal EEG   Common Richanda Darin MD 

## 2020-03-09 NOTE — Telephone Encounter (Signed)
Called the patient to discuss EEG results. There was no answer. LVM informing the pt that the EEG was normal and there was no seizure like activity. Advised the patient that we typically would like to bring the patient in to discuss further. Advised that Dr Vickey Huger had a opening on wed at 2/24 at 9:30 am with check in of 9 am. Informed the patient to call to advise if could take that apt or to get scheduled at a another time.   I have blocked the wed slot to see if pt could take this. If not please let me know and I will unblock.

## 2020-03-10 NOTE — Telephone Encounter (Signed)
Called the patient a 2nd time. LVM for the pt to call back so that we can get him scheduled for a follow up visit.   When pt returns, please advise the EEG was normal, no Seizure like activity and schedule for a follow up visit with Dr Vickey Huger.

## 2020-03-12 ENCOUNTER — Encounter: Payer: Self-pay | Admitting: Neurology

## 2020-03-12 ENCOUNTER — Other Ambulatory Visit: Payer: Self-pay | Admitting: Neurology

## 2020-03-12 ENCOUNTER — Telehealth: Payer: Self-pay | Admitting: Neurology

## 2020-03-12 MED ORDER — PHENYTOIN SODIUM EXTENDED 100 MG PO CAPS: 300.0000 mg | ORAL_CAPSULE | Freq: Every day | ORAL | 5 refills | Status: DC

## 2020-03-12 MED ORDER — PHENYTOIN SODIUM EXTENDED 100 MG PO CAPS: 100.0000 mg | ORAL_CAPSULE | Freq: Every day | ORAL | 3 refills | Status: DC

## 2020-03-12 NOTE — Telephone Encounter (Signed)
Hello, Dr Blenda Nicely.   This nice young man has had several seizures with tongue bite and bruising at night.  I will start him on 300 mg Dilatin , adding this to the 750 mg of Keppra he already takes bid (I wrote 500 mg earlier) he is on 750 mg. He will take both medications for now. Can you see him urgently ? CD

## 2020-03-12 NOTE — Telephone Encounter (Signed)
I have contact the patient also and there was no answer. LVM informing the patient of the additional medication that is being ordered for the patient to pick up from the pharmacy. LVM to continue with current medication and add the dilantin at bedtime. I have the patient's letter for work also ready but wanted to review with him first. Will have letter ready, advised that we are back in office on Monday.

## 2020-03-12 NOTE — Telephone Encounter (Signed)
Pt called stating he had a seizure yesterday and his body feels very fatigue and would like to know if he can get a work note

## 2020-03-12 NOTE — Telephone Encounter (Signed)
Urgent referral to Dr. Edrick Oh , [facility] at [facility] .  Increase Keppra to 500 mg bid.

## 2020-03-12 NOTE — Telephone Encounter (Signed)
Pt presented to the lobby stating he has had 2 seizures within the past couple days and feels very, very weak -he works in a warehouse and requesting a work note. Best call back [phone]

## 2020-03-13 NOTE — Telephone Encounter (Signed)
Good morning,  Since patient has [employer], this will have to be pre-cert and I will need a referral in place to do that. Can someone get that in place and mark it for STAT/Urgent to help speed up the per-cert process?  Thanks, Marylene Land

## 2020-03-13 NOTE — Telephone Encounter (Signed)
Thank you Dr Blenda Nicely !

## 2020-03-16 ENCOUNTER — Other Ambulatory Visit: Payer: Self-pay | Admitting: Neurology

## 2020-03-16 DIAGNOSIS — G40802 Other epilepsy, not intractable, without status epilepticus: Secondary | ICD-10-CM

## 2020-03-16 DIAGNOSIS — R569 Unspecified convulsions: Secondary | ICD-10-CM

## 2020-03-27 ENCOUNTER — Other Ambulatory Visit (HOSPITAL_COMMUNITY)
Admission: RE | Admit: 2020-03-27 | Discharge: 2020-03-27 | Disposition: A | Discharge status: Home / Self Care | Payer: Commercial Managed Care - PPO | Source: Ambulatory Visit | Attending: Neurology | Admitting: Neurology

## 2020-03-27 DIAGNOSIS — Z20822 Contact with and (suspected) exposure to covid-19: Secondary | ICD-10-CM | POA: Insufficient documentation

## 2020-03-27 DIAGNOSIS — Z01812 Encounter for preprocedural laboratory examination: Secondary | ICD-10-CM | POA: Insufficient documentation

## 2020-03-27 LAB — SARS CORONAVIRUS 2 (TAT 6-24 HRS): SARS Coronavirus 2: NEGATIVE

## 2020-03-27 IMAGING — CT CT NECK W/ CM
3 of 4 series · 12 of 35 positions shown, 14 images · IV contrast (Omni 300)
Comparison: No pertinent prior studies available for comparison.

CLINICAL DATA: Epiglottitis or tonsillitis suspected. Additional
history provided: Patient reports pain and difficulty swelling for 5
days, visible swelling on right side.

EXAM:
CT NECK WITH CONTRAST
TECHNIQUE: Multidetector CT imaging of the neck was performed using the
standard protocol following the bolus administration of intravenous
contrast.
CONTRAST:  75mL OMNIPAQUE IOHEXOL 300 MG/ML  SOLN

[Series 2: neck 2.0 st · axial · 0.52mm/px · z∈[-227,-107]mm · 4 of 101 slices shown, 5 images (1 of 3)]
[im 21/101  soft-tissue]
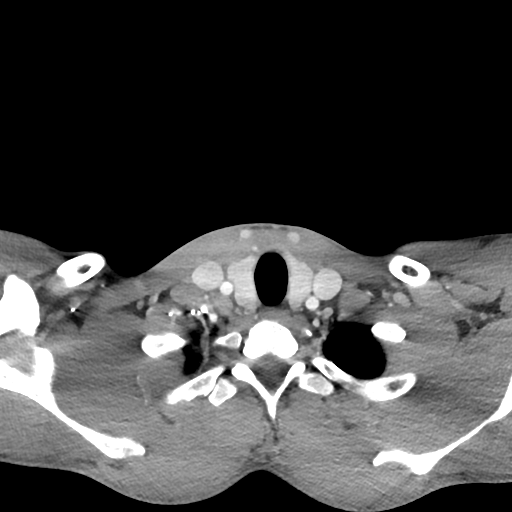
[im 21/101  bone]
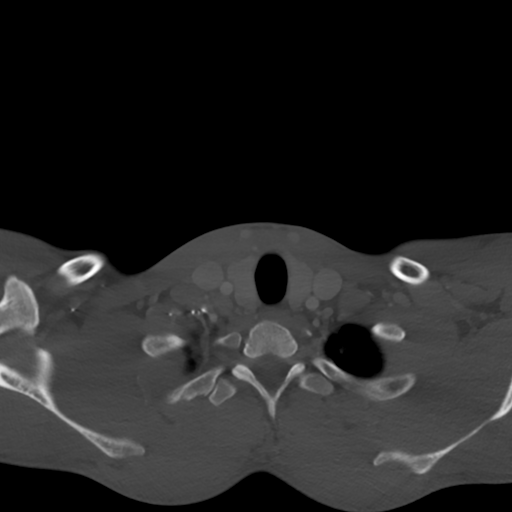
[im 41/101  bone]
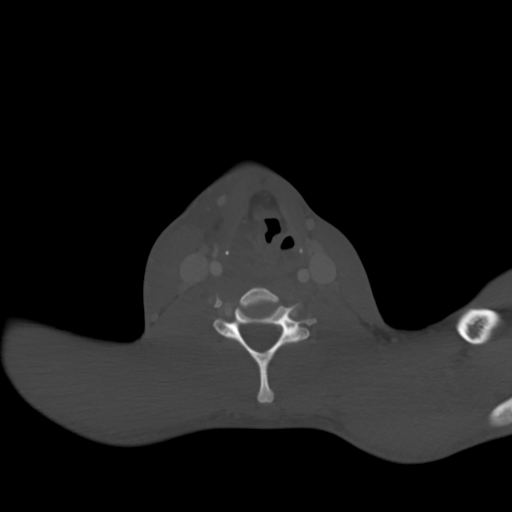
[im 61/101  bone]
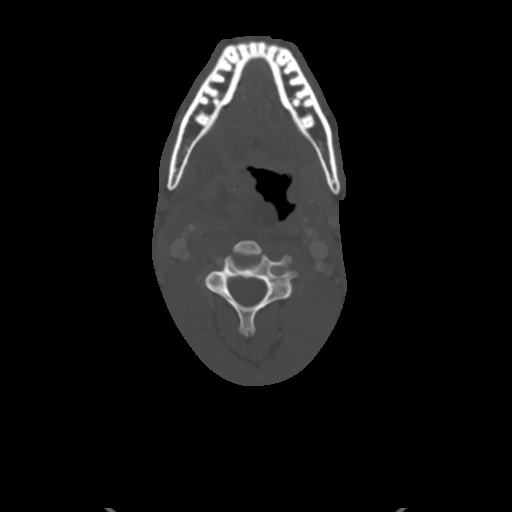
[im 81/101  bone]
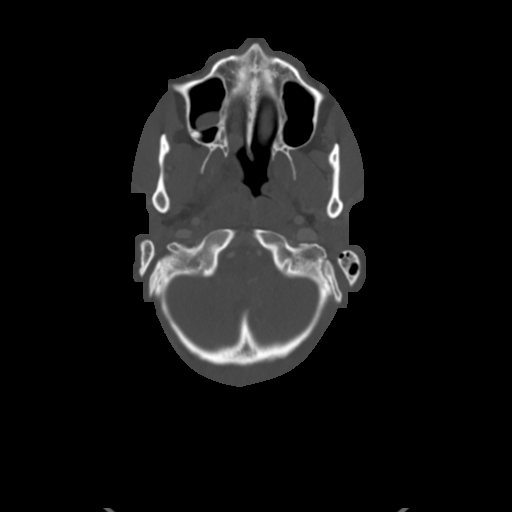

[Series 5: neck 2.0 st · sagittal · 0.39mm/px · 5 of 101 slices shown, 6 images (2 of 3)]
[im 34/101  bone]
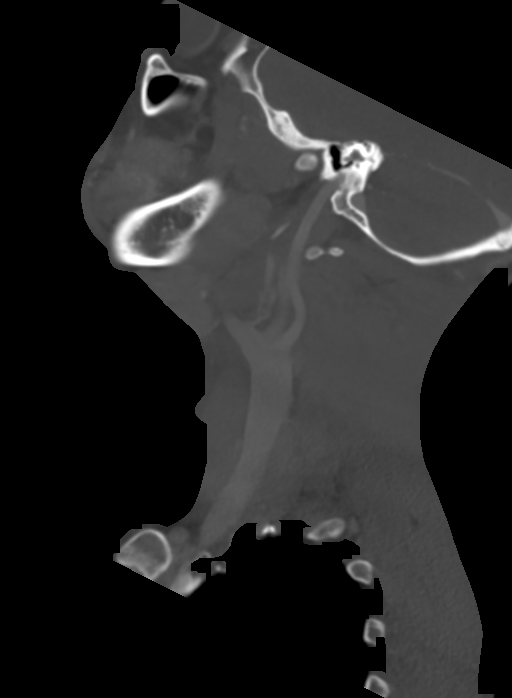
[im 42/101  bone]
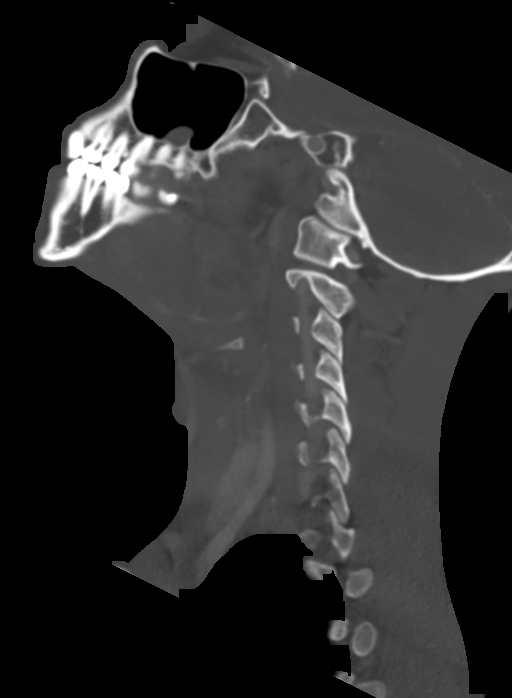
[im 51/101  soft-tissue]
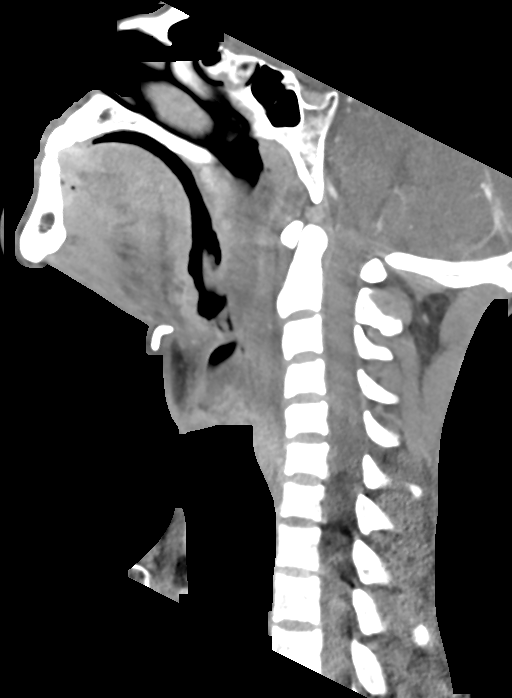
[im 51/101  bone]
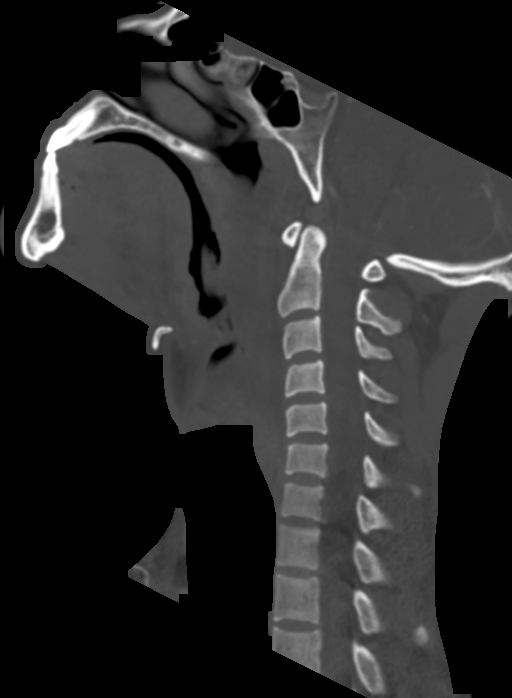
[im 59/101  bone]
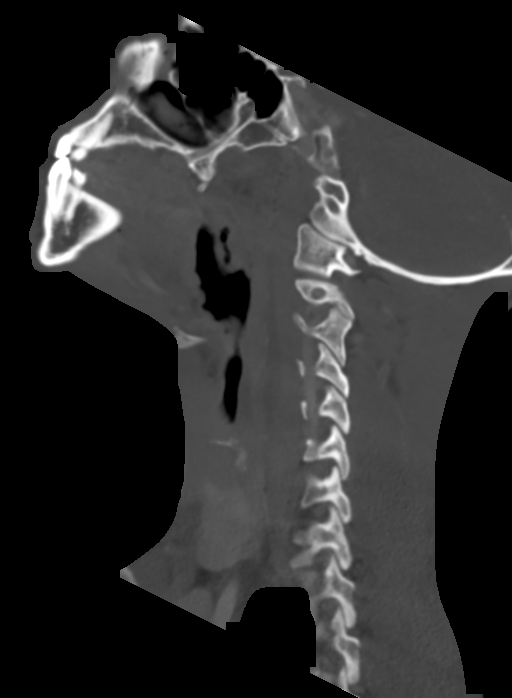
[im 67/101  bone]
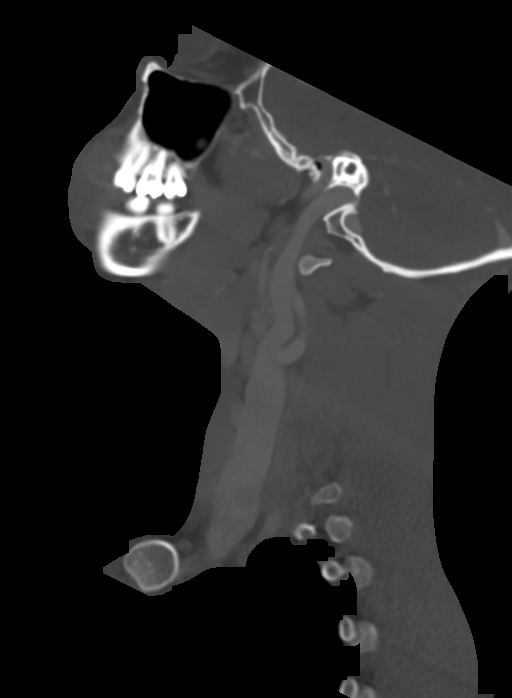

[Series 6: neck 2.0 st · coronal · 0.38mm/px · 3 of 101 slices shown (3 of 3)]
[im 35/101  bone]
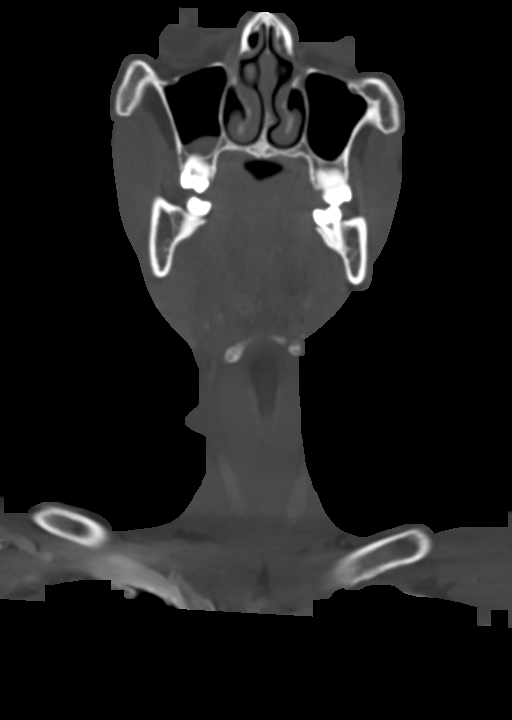
[im 45/101  bone]
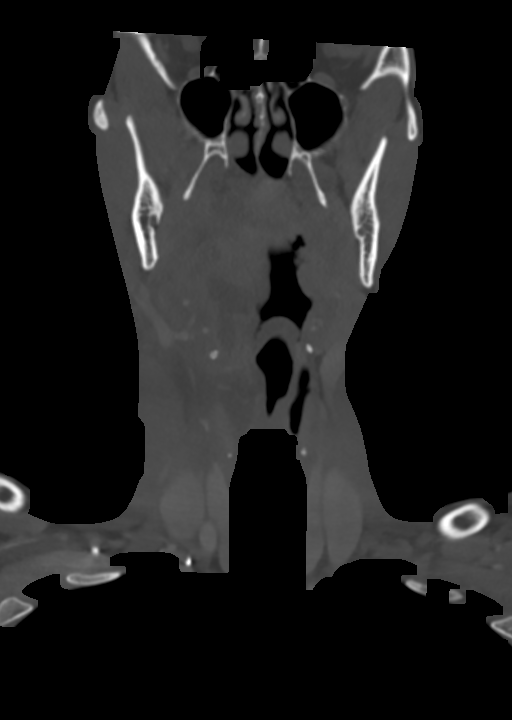
[im 56/101  bone]
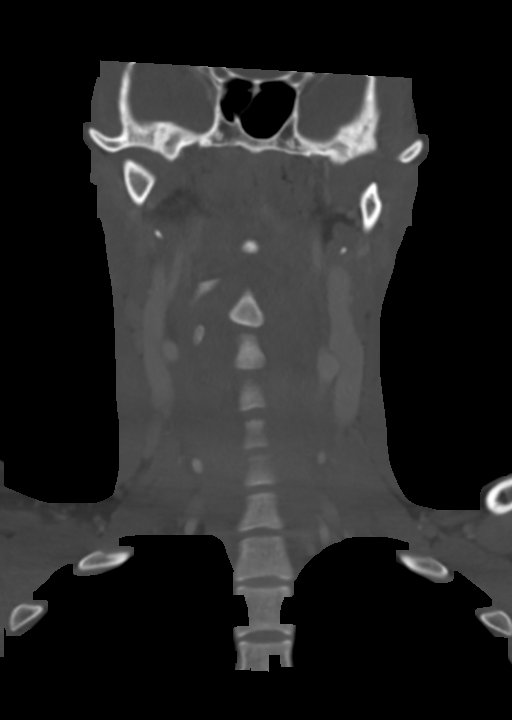

[12 of 35 positions shown; findings below may reference images not displayed]

FINDINGS: Pharynx and larynx: There is marked prominence of the right palatine
tonsil with associated mucosal/submucosal edema extending superiorly
to the level of the nasopharynx as well as inferiorly. There is an
associated multiloculated right peritonsillar abscess which measures
2.4 x 2.1 by 4.1 cm (series 2, image 41) (series 5, image 46). The
peritonsillar abscess encroaches upon the right parapharyngeal
space. There is extension of edema and/or early abscess on the right
caudally to the level of the glottic larynx. At the level of the
supraglottic and glottic larynx this measures 1.2 x 2.2 x 2.7 cm
(series 2, image 61) (series 6, image 45). There is more generalized
soft tissue swelling of the supraglottic larynx and epiglottis
consistent with supraglottitis. Mild to moderate effacement of the
oropharyngeal airway. There is also mild effacement of the laryngeal
airway. There is a retropharyngeal effusion spanning the C2-C6
levels measuring up to 6 mm in AP dimension. No definite peripheral
enhancement to suggest retropharyngeal abscess on the current
examination.

Salivary glands: Asymmetric hyperenhancement of the right
submandibular gland which may reflect reactive sialoadenitis.

Thyroid: Negative

Lymph nodes: Upper cervical chain lymphadenopathy (greater on the
right), likely reactive.

Vascular: The major vascular structures of the neck appear patent.

Limited intracranial: No acute abnormality identified.

Visualized orbits: Incompletely imaged. Visualized orbits
demonstrate no acute abnormality.

Mastoids and visualized paranasal sinuses: Small bilateral maxillary
sinus mucous retention cysts. No significant mastoid effusion.
Redemonstrated nonspecific chronic defect within the lateral aspect
of the right mastoid air cells (series 2, image 16).

Skeleton: No acute bony abnormality or aggressive osseous lesion.

Upper chest: No consolidation within the imaged lung apices.

Other: Associated generalized edema within the right neck.

These results were called by telephone at the time of interpretation
on 01/28/2020 at [DATE] to provider WIRDZE DAUDA , who
verbally acknowledged these results.
IMPRESSION: Findings consistent with right tonsillitis as well pharyngitis and
supraglottitis. Associated 2.4 x 2.1 x 4.1 cm multiloculated right
peritonsillar abscess. Extension of edema and/or early abscess
caudally on the right to the level of the glottic larynx. At the
level of the supraglottic and glottic larynx, this measures 1.2 x
2.2 x 2.7 cm. Associated retropharyngeal effusion measuring 6 mm in
AP dimension. Mild to moderate effacement of the oropharyngeal
airway with mild effacement of the laryngeal airway.

Upper cervical chain lymphadenopathy which is likely reactive.

Hyperenhancement of the right submandibular gland which may reflect
reactive sialoadenitis.

## 2020-03-30 ENCOUNTER — Inpatient Hospital Stay (HOSPITAL_COMMUNITY)
Admission: RE | Admit: 2020-03-30 | Discharge: 2020-04-03 | DRG: 101 | Disposition: A | Discharge status: Home / Self Care | Payer: Commercial Managed Care - PPO | Source: Ambulatory Visit | Attending: Neurology | Admitting: Neurology

## 2020-03-30 ENCOUNTER — Inpatient Hospital Stay (HOSPITAL_COMMUNITY): Payer: Commercial Managed Care - PPO

## 2020-03-30 ENCOUNTER — Inpatient Hospital Stay (HOSPITAL_COMMUNITY): Admit: 2020-03-30 | Payer: Commercial Managed Care - PPO

## 2020-03-30 DIAGNOSIS — G40909 Epilepsy, unspecified, not intractable, without status epilepticus: Principal | ICD-10-CM | POA: Diagnosis present

## 2020-03-30 DIAGNOSIS — Z8349 Family history of other endocrine, nutritional and metabolic diseases: Secondary | ICD-10-CM | POA: Diagnosis not present

## 2020-03-30 DIAGNOSIS — R569 Unspecified convulsions: Secondary | ICD-10-CM

## 2020-03-30 DIAGNOSIS — Z20822 Contact with and (suspected) exposure to covid-19: Secondary | ICD-10-CM | POA: Diagnosis present

## 2020-03-30 DIAGNOSIS — F17200 Nicotine dependence, unspecified, uncomplicated: Secondary | ICD-10-CM | POA: Diagnosis present

## 2020-03-30 LAB — COMPREHENSIVE METABOLIC PANEL
ALT: 11 U/L (ref 0–44)
AST: 14 U/L — ABNORMAL LOW (ref 15–41)
Albumin: 4 g/dL (ref 3.5–5.0)
Alkaline Phosphatase: 46 U/L (ref 38–126)
Anion gap: 10 (ref 5–15)
BUN: 23 mg/dL — ABNORMAL HIGH (ref 6–20)
CO2: 28 mmol/L (ref 22–32)
Calcium: 9.3 mg/dL (ref 8.9–10.3)
Chloride: 101 mmol/L (ref 98–111)
Creatinine, Ser: 0.92 mg/dL (ref 0.61–1.24)
GFR calc Af Amer: >60 mL/min (ref >60)
GFR calc non Af Amer: >60 mL/min (ref >60)
Glucose, Bld: 81 mg/dL (ref 70–99)
Potassium: 3.6 mmol/L (ref 3.5–5.1)
Sodium: 139 mmol/L (ref 135–145)
Total Bilirubin: 0.5 mg/dL (ref 0.3–1.2)
Total Protein: 6.4 g/dL — ABNORMAL LOW (ref 6.5–8.1)

## 2020-03-30 LAB — PROTIME-INR
INR: 1 (ref 0.8–1.2)
Prothrombin Time: 12.6 seconds (ref 11.4–15.2)

## 2020-03-30 LAB — CBC WITH DIFFERENTIAL/PLATELET
Abs Immature Granulocytes: 0.01 10*3/uL (ref 0.00–0.07)
Basophils Absolute: 0 10*3/uL (ref 0.0–0.1)
Basophils Relative: 1 %
Eosinophils Absolute: 0.1 10*3/uL (ref 0.0–0.5)
Eosinophils Relative: 2 %
HCT: 44 % (ref 39.0–52.0)
Hemoglobin: 14.5 g/dL (ref 13.0–17.0)
Immature Granulocytes: 0 %
Lymphocytes Relative: 37 %
Lymphs Abs: 1.8 10*3/uL (ref 0.7–4.0)
MCH: 28.1 pg (ref 26.0–34.0)
MCHC: 33 g/dL (ref 30.0–36.0)
MCV: 85.3 fL (ref 80.0–100.0)
Monocytes Absolute: 0.3 10*3/uL (ref 0.1–1.0)
Monocytes Relative: 6 %
Neutro Abs: 2.5 10*3/uL (ref 1.7–7.7)
Neutrophils Relative %: 54 %
Platelets: 280 10*3/uL (ref 150–400)
RBC: 5.16 MIL/uL (ref 4.22–5.81)
RDW: 12.9 % (ref 11.5–15.5)
WBC: 4.7 10*3/uL (ref 4.0–10.5)
nRBC: 0 % (ref 0.0–0.2)

## 2020-03-30 LAB — MAGNESIUM: Magnesium: 1.9 mg/dL (ref 1.7–2.4)

## 2020-03-30 LAB — PHOSPHORUS: Phosphorus: 4.3 mg/dL (ref 2.5–4.6)

## 2020-03-30 MED ORDER — LORAZEPAM 2 MG/ML IJ SOLN: 2.0000 mg | INTRAMUSCULAR | Status: DC | PRN

## 2020-03-30 MED ORDER — ACETAMINOPHEN 650 MG RE SUPP: 650.0000 mg | RECTAL | Status: DC | PRN

## 2020-03-30 MED ORDER — ACETAMINOPHEN 325 MG PO TABS: 650.0000 mg | ORAL_TABLET | ORAL | Status: DC | PRN

## 2020-03-30 MED ORDER — LABETALOL HCL 5 MG/ML IV SOLN: 5.0000 mg | INTRAVENOUS | Status: DC | PRN

## 2020-03-30 MED ORDER — ENOXAPARIN SODIUM 40 MG/0.4ML ~~LOC~~ SOLN
40.0000 mg | SUBCUTANEOUS | Status: DC
  Administered 2020-03-30 – 2020-03-31 (×2): 40 mg via SUBCUTANEOUS
  Filled 2020-03-30 (×3): qty 0.4

## 2020-03-30 NOTE — Progress Notes (Signed)
vLTM EMU EEG is started. Educated family member event button. Notified Neuro.

## 2020-03-30 NOTE — H&P (Addendum)
CC: Seizure-like episodes  History is obtained from: Patient, mother at bedside and chart review  HPI: Howard Pena is a 24 y.o. right-handed male with past medical history of goiter who is admitted to epilepsy monitoring unit for diagnosis of epilepsy and characterization of spells.  Patient states he had his first seizure-like episode in September 2019 and has had 5 more episodes since then, last episode about a month and a half ago.  He states all his seizure-like episodes happen when he is asleep and he does not remember the episodes.  However his fiance was witnessed episode reports whole body twitching, spit coming out of his mouth and being unable to respond after the episodes for few minutes.  He also reports tongue bite (at tip of the tongue) during one of the episodes but denies any urinary incontinence.  Denies any warning, aura.  Does report increasing stress recently as he is a new dad, also reports working long hours at work especially night shift and not therefore not getting enough sleep.  Denies any other new medications, recent head injury.  Of note patient states he has been taking 1000 mg of Keppra once daily, unable to take his evening dose as it makes him sleepy and he has to be awake at work.  Denies taking any other seizure medications at this point.  Denies any side effects other than excessive drowsiness after taking Keppra.  Patient also states that he has never had seizures after taking his medication but he does sometimes forget to take medication.  Seizure risk factors: Denies febrile seizures, prior history of head injury with loss of consciousness, meningitis/encephalitis.  Patient's mother states patient's aunt had seizures late in life which could have been related to stress but denies any other family stroke epilepsy.  Routine EEG was performed as an outpatient which was reportedly normal.  I am unable to see the official report in epic today.  MRI brain without  contrast 02/14/2020: Normal  ROS: All other systems reviewed and negative except as noted in the HPI.    Past Medical History:  Diagnosis Date  . Constitutional growth delay   . Goiter   . Physical growth delay   . Poor appetite   . Puberty delay     Family History  Problem Relation Age of Onset  . Thyroid disease Mother    Social History:  reports that he has been smoking. He has never used smokeless tobacco. He reports current alcohol use. He reports that he does not use drugs.   Exam: Current vital signs: BP 124/76 (BP Location: Left Arm)   Pulse 74   Resp 20   Ht 5\' 9"  (1.753 m)   Wt 61.2 kg   SpO2 100%   BMI 19.94 kg/m  Vital signs in last 24 hours: Pulse Rate:  [67-74] 74 (04/12 1133) Resp:  [18-20] 20 (04/12 1133) BP: (124)/(76-83) 124/76 (04/12 1133) SpO2:  [100 %] 100 % (04/12 1133) Weight:  [61.2 kg] 61.2 kg (04/12 0847)   Physical Exam  Constitutional: Appears well-developed and well-nourished.  Psych: Affect appropriate to situation Eyes: No scleral injection HENT: No OP obstrucion Head: Normocephalic.  Cardiovascular: Normal rate and regular rhythm.  Respiratory: Effort normal, non-labored breathing GI: Soft.  No distension. There is no tenderness.  Skin: WDI  Neuro: Mental Status: Patient is awake, alert, oriented to person, place, time Patient is able to give a clear and coherent history. No signs of aphasia or neglect Cranial Nerves: II: Visual  Fields are full. Pupils are equal, round, and reactive to light.   III,IV, VI: EOMI without ptosis or diploplia.  V: Facial sensation is symmetric to temperature VII: Facial movement is symmetric.  VIII: hearing is intact to voice X: Uvula elevates symmetrically XI: Shoulder shrug is symmetric. XII: tongue is midline without atrophy or fasciculations.  Motor: Tone is normal. Bulk is normal. 5/5 strength was present in all four extremities.  Sensory: Sensation is symmetric to light touch and  temperature in the arms and legs. Deep Tendon Reflexes: 2+ and symmetric in the biceps and patellae.  Plantars: Toes are downgoing bilaterally.  Cerebellar: FNF are intact bilaterally  I have reviewed labs in epic and the results pertinent to this consultation are: No leukocytosis, BUN 23, normal electrolytes.  I have reviewed the images obtained: MRI brain without contrast 2/20/121: No acute abnormality.  ASSESSMENT/PLAN: 24 year old male with seizure-like episodes admitted to [facility] for diagnosis and characterization of spells.    Seizure-like episodes -We will start LTM EEG for characterization of spells -Patient did not take Keppra this morning.  His seizure frequency is once every few months.  Therefore will HOLD Keppra starting today.  -Discussed seizure provocative measures including sleep deprivation  Will also do photic stimulation hyperventilation most likely tomorrow -Seizure precautions -As needed IV Ativan 2 mg for generalized tonic-clonic seizure lasting more than 2 minutes or focal seizure lasting more than 5 minutes  I have spent a total of 80 minutes with the patient reviewing hospital notes,  test results, labs and examining the patient as well as establishing an assessment and plan that was discussed personally with the patient and her mother at bedside.  > 50% of time was spent in direct patient care.

## 2020-03-30 NOTE — Plan of Care (Signed)
  Problem: Safety: Goal: Ability to remain free from injury will improve Outcome: Progressing   

## 2020-03-31 DIAGNOSIS — R569 Unspecified convulsions: Secondary | ICD-10-CM | POA: Diagnosis not present

## 2020-03-31 NOTE — Plan of Care (Signed)
  Problem: Safety: Goal: Ability to remain free from injury will improve Outcome: Progressing   Problem: Elimination: Goal: Will not experience complications related to bowel motility Outcome: Progressing   

## 2020-03-31 NOTE — Progress Notes (Signed)
Subjective: No seizure-like episodes overnight.  No new concerns this morning.  ROS: negative except above  Examination  Vital signs in last 24 hours: Temp:  [98.2 F (36.8 C)-98.3 F (36.8 C)] 98.3 F (36.8 C) (04/13 0350) Pulse Rate:  [68-92] 84 (04/13 1150) Resp:  [18-20] 18 (04/13 1150) BP: (105-130)/(66-88) 126/88 (04/13 1150) SpO2:  [96 %-100 %] 99 % (04/13 1150)  General: lying in bed, not in apparent distress CVS: pulse-normal rate and rhythm RS: breathing comfortably Extremities: normal, warm  Neuro: MS: Alert, oriented, follows commands CN: pupils equal and reactive,  EOMI, face symmetric, tongue midline, normal sensation over face, Motor: 5/5 strength in all 4 extremities Reflexes: 2+ bilaterally over patella, biceps, plantars: flexor Coordination: normal Gait: not tested  Basic Metabolic Panel: Recent Labs  Lab 03/30/20 0931  NA 139  K 3.6  CL 101  CO2 28  GLUCOSE 81  BUN 23*  CREATININE 0.92  CALCIUM 9.3  MG 1.9  PHOS 4.3    CBC: Recent Labs  Lab 03/30/20 0931  WBC 4.7  NEUTROABS 2.5  HGB 14.5  HCT 44.0  MCV 85.3  PLT 280     Coagulation Studies: Recent Labs    03/30/20 0931  LABPROT 12.6  INR 1.0    Imaging No new brain imaging overnight  ASSESSMENT/PLAN: 24 year old male with seizure-like episodes admitted to [facility] for diagnosis and characterization of spells.    Seizure-like episodes -Continue LTM EEG for characterization of spells -Continue to hold HOLD Keppra -Discussed seizure provocative measures including sleep deprivation  Will also do photic stimulation hyperventilation today -Seizure precautions -As needed IV Ativan 2 mg for generalized tonic-clonic seizure lasting more than 2 minutes or focal seizure lasting more than 5 minutes  I have spent a total of25 minuteswith the patient reviewing hospitalnotes,  test results, labs and examining the patient as well as establishing an assessment and plan that was  discussed personally with the patient and her mother at bedside.>50% of time was spent in direct patient care.

## 2020-03-31 NOTE — Procedures (Signed)
Patient Name: Howard Pena  MRN: [id number]  Epilepsy Attending: Charlsie Quest  Referring Physician/Provider: Dr Lindie Spruce Duration: 03/30/2020 4373 to 03/31/2020 0824  Patient history: 24 year old male with seizure-like episodes admitted to [facility] for diagnosis and characterization of spells. EEG to evaluate for seizure  Level of alertness: awake, asleep  AEDs during EEG study: None  Technical aspects: This EEG study was done with scalp electrodes positioned according to the 10-20 International system of electrode placement. Electrical activity was acquired at a sampling rate of 500Hz  and reviewed with a high frequency filter of 70Hz  and a low frequency filter of 1Hz . EEG data were recorded continuously and digitally stored.   DESCRIPTION: The posterior dominant rhythm consists of 10 Hz activity of moderate voltage (25-35 uV) seen predominantly in posterior head regions, symmetric and reactive to eye opening and eye closing. Sleep was characterized by vertex waves, sleep spindles (12-14hz ), maximal frontocentral, REM sleep. Hyperventilation and photic stimulation were not performed.  IMPRESSION: This study is within normal limits. No seizures or epileptiform discharges were seen throughout the recording.  Howard Pena 

## 2020-03-31 NOTE — Progress Notes (Signed)
Photic and HV performed. No events noted.  LTM maint complete - no skin breakdown under: fp1,fp2,a2. Test button. [facility] notified.

## 2020-04-01 ENCOUNTER — Encounter (HOSPITAL_COMMUNITY): Payer: Self-pay | Admitting: Neurology

## 2020-04-01 DIAGNOSIS — R569 Unspecified convulsions: Secondary | ICD-10-CM | POA: Diagnosis not present

## 2020-04-01 NOTE — Plan of Care (Signed)
  Problem: Education: Goal: Knowledge of General Education information will improve Description: Including pain rating scale, medication(s)/side effects and non-pharmacologic comfort measures Outcome: Progressing   Problem: Safety: Goal: Ability to remain free from injury will improve Outcome: Progressing   

## 2020-04-01 NOTE — Progress Notes (Signed)
Has remained awake throughout the night so far with his mom at his side.  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  He is playing games on his phone and talking to his fiance'.  No signs of distress.  No complaints.  Said he does not have an aura or any feeling that a seizure is coming on.  Ambulates to the bathroom with SBA.

## 2020-04-01 NOTE — Procedures (Signed)
Patient Name: Howard Pena  MRN: [id number]  Epilepsy Attending: Charlsie Quest  Referring Physician/Provider: Dr Lindie Spruce Duration: 03/31/2020 4680 to 04/01/2020 0824  Patient history: 24 year old male with seizure-like episodes admitted to [facility] for diagnosis and characterization of spells. EEG to evaluate for seizure  Level of alertness: awake, asleep  AEDs during EEG study: None  Technical aspects: This EEG study was done with scalp electrodes positioned according to the 10-20 International system of electrode placement. Electrical activity was acquired at a sampling rate of 500Hz  and reviewed with a high frequency filter of 70Hz  and a low frequency filter of 1Hz . EEG data were recorded continuously and digitally stored.   DESCRIPTION: The posterior dominant rhythm consists of 10 Hz activity of moderate voltage (25-35 uV) seen predominantly in posterior head regions, symmetric and reactive to eye opening and eye closing. Sleep was characterized by vertex waves, sleep spindles (12-14hz ), maximal frontocentral, REM sleep. Physiologic photic driving was seen during photic stimulation. No eeg change was seen during hyperventilation.  IMPRESSION: This study is within normal limits. No seizures or epileptiform discharges were seen throughout the recording.  Mattalynn Crandle 

## 2020-04-01 NOTE — Progress Notes (Signed)
Subjective: No acute events overnight.  No seizure-like episodes.  ROS: negative except above   Examination  Vital signs in last 24 hours: Temp:  [97.5 F (36.4 C)-98.3 F (36.8 C)] 97.5 F (36.4 C) (04/14 1104) Pulse Rate:  [65-86] 71 (04/14 1304) Resp:  [18-20] 18 (04/14 1304) BP: (116-128)/(63-100) 116/77 (04/14 1304) SpO2:  [93 %-100 %] 100 % (04/14 1304)  General: lying in bed, not in apparent distress CVS: pulse-normal rate and rhythm RS: breathing comfortably Extremities: normal, warm  Neuro: MS: Alert, oriented, follows commands CN: pupils equal and reactive,  EOMI, face symmetric, tongue midline, normal sensation over face, Motor: 5/5 strength in all 4 extremities Reflexes: 2+ bilaterally over patella, biceps, plantars: flexor Coordination: normal Gait: not tested  Basic Metabolic Panel: Recent Labs  Lab 03/30/20 0931  NA 139  K 3.6  CL 101  CO2 28  GLUCOSE 81  BUN 23*  CREATININE 0.92  CALCIUM 9.3  MG 1.9  PHOS 4.3    CBC: Recent Labs  Lab 03/30/20 0931  WBC 4.7  NEUTROABS 2.5  HGB 14.5  HCT 44.0  MCV 85.3  PLT 280     Coagulation Studies: Recent Labs    03/30/20 0931  LABPROT 12.6  INR 1.0    Imaging No new brain imaging overnight  ASSESSMENT/PLAN:24 year old male with seizure-like episodes admitted to [facility] for diagnosis and characterization of spells.  Seizure-like episodes -Continue LTM EEG for characterization of spells -Continue to hold HOLDKeppra -Continue seizure provocative measures including sleep deprivation Will repeat photic stimulation and hyperventilation today -Seizure precautions -As needed IV Ativan 2 mg for generalized tonic-clonic seizure lasting more than 2 minutes or focal seizure lasting more than 5 minutes -Holding off Lovenox as patient is relatively young without any risk factors and be moving in bed and requested to hold off  I have spent a total of33minuteswith the patient reviewing  hospitalnotes, test results, labs and examining the patient as well as establishing an assessment and plan that was discussed personally with the patient and her mother at bedside.>50% of time was spent in direct patient care.  Lindie Spruce Epilepsy [facility] For questions after 5pm please refer to AMION to reach the Neurologist on call

## 2020-04-02 DIAGNOSIS — R569 Unspecified convulsions: Secondary | ICD-10-CM | POA: Diagnosis not present

## 2020-04-02 NOTE — Progress Notes (Signed)
Subjective: No acute events overnight.  ROS: negative except above  Examination  Vital signs in last 24 hours: Temp:  [97.6 F (36.4 C)-98.6 F (37 C)] 98 F (36.7 C) (04/15 0826) Pulse Rate:  [65-103] 69 (04/15 1025) Resp:  [16-20] 16 (04/15 0826) BP: (100-123)/(66-85) 105/80 (04/15 1025) SpO2:  [98 %-100 %] 99 % (04/15 1025)  General: lying in bed,not in apparent distress CVS: pulse-normal rate and rhythm RS: breathing comfortably Extremities: normal,warm  Neuro: MS: Alert, oriented, follows commands CN: pupils equal and reactive, EOMI, face symmetric, tongue midline, normal sensation over face, Motor: 5/5 strength in all 4 extremities Reflexes: 2+ bilaterally over patella, biceps, plantars: flexor Coordination: normal Gait: not tested  Basic Metabolic Panel: Recent Labs  Lab 03/30/20 0931  NA 139  K 3.6  CL 101  CO2 28  GLUCOSE 81  BUN 23*  CREATININE 0.92  CALCIUM 9.3  MG 1.9  PHOS 4.3    CBC: Recent Labs  Lab 03/30/20 0931  WBC 4.7  NEUTROABS 2.5  HGB 14.5  HCT 44.0  MCV 85.3  PLT 280     Coagulation Studies: No results for input(s): LABPROT, INR in the last 72 hours.  Imaging No new brain imaging overnight  ASSESSMENT/PLAN:24 year old male with seizure-like episodes admitted to [facility] for diagnosis and characterization of spells.  Seizure-like episodes -ContinueLTM EEG for characterization of spells -Continue to [employer] -Continue seizure provocative measures including sleep deprivation  -Seizure precautions -As needed IV Ativan 2 mg for generalized tonic-clonic seizure lasting more than 2 minutes or focal seizure lasting more than 5 minutes  I have spent a total of36minuteswith the patient reviewing hospitalnotes, test results, labs and examining the patient as well as establishing an assessment and plan that was discussed personally with the patient and her mother at bedside.>50% of time was spent in direct patient  care.  Lindie Spruce Epilepsy [facility] For questions after 5pm please refer to AMION to reach the Neurologist on call

## 2020-04-02 NOTE — Procedures (Signed)
Patient Name:Howard Pena YTK:[id number] Epilepsy Attending:Ayush Boulet Annabelle Harman Referring Physician/Provider:Dr Lindie Spruce Duration:04/01/2020 0824 to 04/02/2020 5573  Patient history:24 year old male with seizure-like episodes admitted to [facility] for diagnosis and characterization of spells.EEG to evaluate for seizure  Level of alertness:awake, asleep  AEDs during EEG study:None  Technical aspects: This EEG study was done with scalp electrodes positioned according to the 10-20 International system of electrode placement. Electrical activity was acquired at a sampling rate of 500Hz  and reviewed with a high frequency filter of 70Hz  and a low frequency filter of 1Hz . EEG data were recorded continuously and digitally stored.  DESCRIPTION: The posterior dominant rhythm consists of10Hz  activity of moderate voltage (25-35 uV) seen predominantly in posterior head regions, symmetric and reactive to eye opening and eye closing. Sleep was characterized by vertex waves, sleep spindles (12-14hz ), maximal frontocentral, REM sleep. Physiologic photic driving was seen during photic stimulation. No eeg change was seen during hyperventilation.  IMPRESSION: This study is within normal limits.No seizures or epileptiform discharges were seen throughout the recording.  Howard Pena 

## 2020-04-03 ENCOUNTER — Other Ambulatory Visit: Payer: Self-pay

## 2020-04-03 ENCOUNTER — Encounter: Payer: Self-pay | Admitting: Neurology

## 2020-04-03 DIAGNOSIS — R569 Unspecified convulsions: Secondary | ICD-10-CM | POA: Diagnosis not present

## 2020-04-03 MED ORDER — ZONISAMIDE 50 MG PO CAPS: 50.0000 mg | ORAL_CAPSULE | Freq: Every day | ORAL | 1 refills | Status: DC

## 2020-04-03 NOTE — Discharge Summary (Signed)
Physician Discharge Summary  Patient ID: Ostin Mathey MRN: [id number] DOB/AGE: 13-Apr-1996 24 y.o.  Admit date: 03/30/2020 Discharge date: 04/03/2020  Admission Diagnoses: convulsions  Discharge Diagnoses:  Active Problems:   Convulsion [facility])   Discharged Condition: stable  Hospital Course: Mr Bowdish was admitted to EMU from 03/30/2020 to 04/03/2020 and underwent 24 hour continuous video EEG monitoring. Seizure provocative measures including sleep deprivation, photic stimulation and hyperventilation were performed. Keppra was also held throughout the stay. EEG was within normal limits. No events were recorded.   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 I also discussed the possibility of non epileptic events. Patient and mother acknowledge role of stress and are in agreement that it might be the reason for his seizure like episodes.     Patient expressed difficulty taking BID dosing of LEV. Therefore, switched to Zonisamide 50mg  daily. If insurance approves, LEV extended release might be an option as well as Lamotrigine extended release in future.  Seizure precautions were discussed. Prescription for Zonisamide was sent to his pharmacy. He will follow up with Dr as outpatient.  Consults: None  Significant Diagnostic Studies:   V EEG 03/30/2020-4/16/20211:  DESCRIPTION: The posterior dominant rhythm consists of10Hz  activity of moderate voltage (25-35 uV) seen predominantly in posterior head regions, symmetric and reactive to eye opening and eye closing. Sleep was characterized by vertex waves, sleep spindles (12-14hz ), maximal frontocentral, REM sleep.Physiologic photic driving was seen during photic stimulation. No eeg change was seen during hyperventilation.  IMPRESSION: This  study is within normal limits.No seizures or epileptiform discharges were seen throughout the recording.   Treatments: Zonisamide 50mg  daily  Discharge Exam: Blood pressure 136/83, pulse 84, temperature 98.6 F (37 C), temperature source Oral, resp. rate 18, height 5\' 9"  (1.753 m), weight 61.2 kg, SpO2 100 %.  General: lying in bed,not in apparent distress CVS: pulse-normal rate and rhythm RS: breathing comfortably Extremities: normal,warm  Neuro: MS: Alert, oriented, follows commands CN: pupils equal and reactive, EOMI, face symmetric, tongue midline, normal sensation over face, Motor: 5/5 strength in all 4 extremities Reflexes: 2+ bilaterally over patella, biceps, plantars: flexor Coordination: normal Gait: not tested  Disposition: Discharge disposition: 01-Home or Self Care   Discharge Instructions    Call MD for:   Complete by: As directed    Seizure like episodes, medication concerns   Diet - low sodium heart healthy   Complete by: As directed    Increase activity slowly   Complete by: As directed      Allergies as of 04/03/2020      Reactions   Amoxicillin Other (See Comments)   Did it involve swelling of the face/tongue/throat, SOB, or low BP? Unknown Did it involve sudden or severe rash/hives, skin peeling, or any reaction on the inside of your mouth or nose? Unknown Did you need to seek medical attention at a hospital or doctor's office? Unknown When did it last happen?pt was a child If all above answers are "NO", may proceed with cephalosporin use.      Medication List    STOP taking these medications   levETIRAcetam 1000 MG tablet Commonly known as: KEPPRA   phenytoin 100 MG ER capsule Commonly known as: Dilantin     TAKE these medications   zonisamide 50 MG capsule Commonly known as: ZONEGRAN Take  1 capsule (50 mg total) by mouth daily.       I have spent a total of  40  minutes with the patient reviewing hospital notes,  test  results, labs and examining the patient as well as establishing an assessment and plan that was discussed personally with the patient and his mother.  > 50% of time was spent in direct patient care.     Signed: Lora Havens 04/03/2020, 10:15 AM

## 2020-04-03 NOTE — Progress Notes (Signed)
[facility] [address] [place], [place] [address]  April 03, 2020   To whom it may concern,  [name] was a patient under my care here at [facility] and underwent workup. After discharge, I request [name] be excused from work for 3 days.  If you have any questions or concerns, please don't hesitate to call.  Sincerely, Lindie Spruce  Office: [phone] Fax: [phone]

## 2020-04-03 NOTE — Procedures (Signed)
Patient Name:Jamaurion Torelli NTZ:[id number] Epilepsy Attending:Avielle Imbert Annabelle Harman Referring Physician/Provider:Dr Lindie Spruce Duration:04/02/2020 0824 to 04/03/2020 0902  Patient history:24 year old male with seizure-like episodes admitted to [facility] for diagnosis and characterization of spells.EEG to evaluate for seizure  Level of alertness:awake, asleep  AEDs during EEG study:None  Technical aspects: This EEG study was done with scalp electrodes positioned according to the 10-20 International system of electrode placement. Electrical activity was acquired at a sampling rate of 500Hz  and reviewed with a high frequency filter of 70Hz  and a low frequency filter of 1Hz . EEG data were recorded continuously and digitally stored.  DESCRIPTION: The posterior dominant rhythm consists of10Hz  activity of moderate voltage (25-35 uV) seen predominantly in posterior head regions, symmetric and reactive to eye opening and eye closing. Sleep was characterized by vertex waves, sleep spindles (12-14hz ), maximal frontocentral, REM sleep.  IMPRESSION: This study is within normal limits.No seizures or epileptiform discharges were seen throughout the recording.  Anilah Huck 

## 2020-04-03 NOTE — Progress Notes (Signed)
LTM EEG discontinued.  Small area of skin break down noted at the F8 area. No other noted.

## 2020-04-03 NOTE — Discharge Instructions (Signed)
You were admitted to [facility] Epilepsy Monitoring Unit from 03/30/2020 to 04/03/2020 and underwent 24 hour continuous video eeg monitoring. We stopped you home anti seizure medication Keppra, performed seizure provocative measures including sleep deprivation, photic stimulation and hyperventilation. Your eeg during then whole stay was within normal limits. This doesn't mean that you cannot have epilepsy but the chances are significantly lower. We discussed possibility of non-epileptic spells as well. You reported having difficult taking medication twice daily with your work and therefore we switched you to zonisamide 50mg  once daily. Seizure precautions including DO NOT DRIVE were discussed. Please follow up with your primary neurologist.  Seizure precautions: Per [facility] statutes, patients with seizures are not allowed to drive until they have been seizure-free for six months and cleared by a physician    Use caution when using heavy equipment or power tools. Avoid working on ladders or at heights. Take showers instead of baths. Ensure the water temperature is not too high on the home water heater. Do not go swimming alone. Do not lock yourself in a room alone (i.e. bathroom). When caring for infants or small children, sit down when holding, feeding, or changing them to minimize risk of injury to the child in the event you have a seizure. Maintain good sleep hygiene. Avoid alcohol.    If patient has another seizure, call 911 and bring them back to the ED if: A.  The seizure lasts longer than 5 minutes.      B.  The patient doesn't wake shortly after the seizure or has new problems such as difficulty seeing, speaking or moving following the seizure C.  The patient was injured during the seizure D.  The patient has a temperature over 102 F (39C) E.  The patient vomited during the seizure and now is having trouble breathing    During the Seizure   - First, ensure adequate ventilation and  place patients on the floor on their left side  Loosen clothing around the neck and ensure the airway is patent. If the patient is clenching the teeth, do not force the mouth open with any object as this can cause severe damage - Remove all items from the surrounding that can be hazardous. The patient may be oblivious to what's happening and may not even know what he or she is doing. If the patient is confused and wandering, either gently guide him/her away and block access to outside areas - Reassure the individual and be comforting - Call 911. In most cases, the seizure ends before EMS arrives. However, there are cases when seizures may last over 3 to 5 minutes. Or the individual may have developed breathing difficulties or severe injuries. If a pregnant patient or a person with diabetes develops a seizure, it is prudent to call an ambulance. - Finally, if the patient does not regain full consciousness, then call EMS. Most patients will remain confused for about 45 to 90 minutes after a seizure, so you must use judgment in calling for help. - Avoid restraints but make sure the patient is in a bed with padded side rails - Place the individual in a lateral position with the neck slightly flexed; this will help the saliva drain from the mouth and prevent the tongue from falling backward - Remove all nearby furniture and other hazards from the area - Provide verbal assurance as the individual is regaining consciousness - Provide the patient with privacy if possible - Call for help and start treatment  as ordered by the caregiver    After the Seizure (Postictal Stage)   After a seizure, most patients experience confusion, fatigue, muscle pain and/or a headache. Thus, one should permit the individual to sleep. For the next few days, reassurance is essential. Being calm and helping reorient the person is also of importance.   Most seizures are painless and end spontaneously. Seizures are not harmful to  others but can lead to complications such as stress on the lungs, brain and the heart. Individuals with prior lung problems may develop labored breathing and respiratory distress.

## 2020-04-03 NOTE — Progress Notes (Signed)
NURSING PROGRESS NOTE  Howard Pena [id number] Discharge Data: 04/03/2020 11:04 AM Attending Provider: Charlsie Quest, MD KPT:[id number], No Pcp Per     Howard Pena discharged home per MD order.  Discussed with the patient the After Visit Summary and all questions fully answered. All IV's discontinued with no bleeding noted. All belongings returned to patient for patient to take home.   Last Vital Signs:  Blood pressure 136/83, pulse 84, temperature 98.6 F (37 C), temperature source Oral, resp. rate 18, height 5\' 9"  (1.753 m), weight 61.2 kg, SpO2 100 %.  Discharge Medication List Allergies as of 04/03/2020      Reactions   Amoxicillin Other (See Comments)   Did it involve swelling of the face/tongue/throat, SOB, or low BP? Unknown Did it involve sudden or severe rash/hives, skin peeling, or any reaction on the inside of your mouth or nose? Unknown Did you need to seek medical attention at a hospital or doctor's office? Unknown When did it last happen?pt was a child If all above answers are "NO", may proceed with cephalosporin use.      Medication List    STOP taking these medications   levETIRAcetam 1000 MG tablet Commonly known as: KEPPRA   phenytoin 100 MG ER capsule Commonly known as: Dilantin     TAKE these medications   zonisamide 50 MG capsule Commonly known as: ZONEGRAN Take 1 capsule (50 mg total) by mouth daily.

## 2020-04-06 ENCOUNTER — Telehealth: Payer: Self-pay | Admitting: Neurology

## 2020-04-06 LAB — LEVETIRACETAM LEVEL: Levetiracetam Lvl: <1 ug/mL — ABNORMAL LOW (ref 10.0–40.0)

## 2020-04-06 NOTE — Telephone Encounter (Signed)
EMU EEG from April 14-16 was negative for epileptiform discharges.

## 2020-04-06 NOTE — Telephone Encounter (Signed)
Called the pt to discuss. There was no answer.LVM advising the EEG completed through Dr Melynda Ripple was normal. LVM for pt to call back if wanting a sooner apt then 04/14/2020. If pt calls back and we have an open slot before next tues, please offer sooner.

## 2020-04-12 ENCOUNTER — Telehealth: Payer: Self-pay | Admitting: Neurology

## 2020-04-12 NOTE — Telephone Encounter (Signed)
Dear Dr. Blenda Nicely , I will refill the alternative Zonisamide as a once daily regimen. I will also urge him to establish primary care.  Thank you very much, CD.          Charlsie Quest, MD  Jennette Leask, Porfirio Mylar, MD  Hello Dr Serra Younan,   Thank you for referring this patient to her epilepsy monitoring unit. We have been monitoring him since Monday morning. His EEG so far looks very clean, no abnormal interictal/ictal activity has been captured. Talking to him and his mother, it is possible that he may be having nonepileptic events. He did mention that he takes Keppra only once a day because it makes him drowsy and he does not want to take medication at night before he goes to work. He also mentioned that he has not had any seizure-like episodes while he is taking the medication and that they usually happen when he forgets his doses. I was wondering if it would be more beneficial to put him on a longer acting medication like zonisamide that he can take once daily or Keppra extended release which is sometimes difficult to get approved with insurance. Let me know if you have a preference. We will likely discharge him tomorrow and I will send you the final report.   Lorrin Jackson  [phone]

## 2020-04-14 ENCOUNTER — Ambulatory Visit: Payer: Commercial Managed Care - PPO | Admitting: Neurology

## 2020-04-14 ENCOUNTER — Encounter: Payer: Self-pay | Admitting: Neurology

## 2020-04-14 ENCOUNTER — Other Ambulatory Visit: Payer: Self-pay

## 2020-04-14 VITALS — BP 129/86 | HR 83 | Temp 97.8°F | Ht 67.0 in | Wt 124.0 lb

## 2020-04-14 DIAGNOSIS — R569 Unspecified convulsions: Secondary | ICD-10-CM | POA: Diagnosis not present

## 2020-04-14 DIAGNOSIS — Z72820 Sleep deprivation: Secondary | ICD-10-CM

## 2020-04-14 MED ORDER — ZONISAMIDE 50 MG PO CAPS: 50.0000 mg | ORAL_CAPSULE | Freq: Every day | ORAL | 3 refills | Status: DC

## 2020-04-14 NOTE — Patient Instructions (Addendum)
Mr folks had no seizures since being on medications: His seizures were all sleep related - September 2020 through January 2021.  I will release him to work due to the sleep related nature of his spells, beginning May 3rd. Marland Kitchen   He has been seizure free for 3 month and will stay on Zonisamide.   He was under stress when he had his spells- and was sleep deprived.  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  Return to driving if seizure free in August 1 st. He will have another visit in early August, I refilled 90 days of  medication with refills.    Melvyn Novas, MD

## 2020-04-14 NOTE — Progress Notes (Signed)
SLEEP MEDICINE CLINIC    Provider:  Melvyn Novas, MD  Primary Care Physician:     Referring Provider: Lorre Nick, MD         Chief Complaint according to patient   Patient presents with:    . New Patient (Initial Visit)           HISTORY OF PRESENT ILLNESS:  Howard Pena is a 24 y.o. year old  African American male patient is seen in a RV on 04/14/2020 for a seizure work up.  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  He had a normal MRI of the brain and 2 EEGs 1 here ambulatory setting, and then an epilepsy monitoring stay is Dr. Corine Shelter which also did not reveal seizure activity.  There was no abnormal interictal activity.  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  Dr. Melynda Ripple had suggested zonisamide and actually prescribed the first months.  A Keppra extended release was also discussed but this can be difficult and his insurance did not allow [place] to go to that way.  He was finally discharged with a normal EEG and on zonisamide.  I will refill the end of the month.  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  Legally in [place] he is not supposed to do that for 6 months after a seizure, however he has been able to go to work with the help of his fiance but since they have also a very small young child at home she has count of double duty and he is the main breadwinner now.  He had no seizures since being on medications: His seizures were all sleep related - September 2020 through January 2021. I will release him to work due to the sleep related nature of his spells.  He has been seizure free for 3 month and will stay on Zonisamide. He was under stress when he  had his spells- and was sleep deprived.  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  Return to driving if seizure free in August 1 st.       01-22-2020 consult , CD He presented to the ED on 09-08-19, and was referred to neurology. He had 4 spells total.  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  He was asleep in the afternoon which is his usual time to rest after work until his very first seizure occurred at about 4 or 4:30 PM.  Since then all other spells have also occurred out of sleep and has been witnessed by his male partner.  He has not woken up from the spells directly but she has noticed frothy discharge at the mouth and most recently bleeding as the patient suffered a tongue bite.  When he finally came to his body was sore and achy, he was confused and felt exhausted completely fatigued.  The most recent spell was also followed by a headache.  One of the nights that he had seizures and sleep he had 2 spells in short succession  and his feels it was not quite sure how long each spell lasted but he seemed to have been snoring loudly and was very difficult to arouse he was unresponsive.  Heavy or labored breathing is a common side effect of post ictal recovery. He was very dizzy and nauseated after his seizure, symptoms lasted  for a day   Chief concern according to patient : sleep seizures.    I have the pleasure of seeing Howard Pena today, a right-handedAfrican American male with a possible nocturnal seizure/ sleep disorder.  He  has a past medical history of Constitutional growth delay, Goiter, Physical growth delay, Poor appetite, and Puberty delay. " late bloomer".      Family medical history: no seizure history    Social history:  Patient is working as a Production designer, theatre/television/film and lives in a household with fiancee and new born baby- son is 43  month old. The patient currently works in shifts( Chief Technology Officer,) Pets are present: bearded dragon. Tobacco use quit smoking at sons birthday.  ETOH use - moderate ,  Caffeine intake in form of Coffee( none) Soda( at work - 2-3 a day) Tea ( rare ) and lots of  energy drinks- 2-3 monsters.  Hobbies : none .    Sleep habits are as follows: The patient's dinner time is at work- night shifts- 5 Pm through 6 AM.  He is home by 8.30 AM-  Sleeping 10 AM -16 hours. Averaging 12-16 hours at work and 6-7 hours of sleep.  The preferred sleep position is prone ,  Co-sleeping with baby and fiancee- with the support of 1 pillows.  Dreams are reportedly frequent.    Review of Systems: Out of a complete 14 system review, the patient complains of only the following symptoms, and all other reviewed systems are negative.:    Seizure- sleep   Social History   Socioeconomic History  . Marital status: Single    Spouse name: Not on file  . Number of children: Not on file  . Years of education: Not on file  . Highest education level: Not on file  Occupational History  . Not on file  Tobacco Use  . Smoking status: Current Every Day Smoker  . Smokeless tobacco: Never Used  Substance and Sexual Activity  . Alcohol use: Yes    Comment: occasional  . Drug use: No  . Sexual activity: Not on file  Other Topics Concern  . Not on file  Social History Narrative  . Not on file   Social Determinants of Health   Financial Resource Strain:   . Difficulty of Paying Living Expenses:   Food Insecurity:   . Worried About Programme researcher, broadcasting/film/video in the Last Year:   . Barista in the Last Year:   Transportation Needs:   . Freight forwarder (Medical):   Marland Kitchen Lack of Transportation (Non-Medical):   Physical Activity:   . Days of Exercise per Week:   . Minutes of Exercise per Session:   Stress:   . Feeling of Stress :   Social Connections:   . Frequency of Communication with Friends and Family:     . Frequency of Social Gatherings with Friends and Family:   . Attends Religious Services:   . Active Member of Clubs or Organizations:   . Attends Banker Meetings:   Marland Kitchen Marital Status:     Family History  Problem Relation Age of Onset  . Thyroid disease  Mother     Past Medical History:  Diagnosis Date  . Constitutional growth delay   . Goiter   . Physical growth delay   . Poor appetite   . Puberty delay     Past Surgical History:  Procedure Laterality Date  . right ear surgery       Current Outpatient Medications on File Prior to Visit  Medication Sig Dispense Refill  . zonisamide (ZONEGRAN) 50 MG capsule Take 1 capsule (50 mg total) by mouth daily. 30 capsule 1   Current Facility-Administered Medications on File Prior to Visit  Medication Dose Route Frequency Provider Last Rate Last Admin  . gadobenate dimeglumine (MULTIHANCE) injection 12 mL  12 mL Intravenous Once PRN Asna Muldrow, Asencion Partridge, MD        Allergies  Allergen Reactions  . Amoxicillin Other (See Comments)    Did it involve swelling of the face/tongue/throat, SOB, or low BP? Unknown Did it involve sudden or severe rash/hives, skin peeling, or any reaction on the inside of your mouth or nose? Unknown Did you need to seek medical attention at a hospital or doctor's office? Unknown When did it last happen?pt was a child If all above answers are "NO", may proceed with cephalosporin use.     Physical exam:  Today's Vitals   04/14/20 1406  BP: 129/86  Pulse: 83  Temp: 97.8 F (36.6 C)  Weight: 124 lb (56.2 kg)  Height: 5\' 7"  (1.702 m)   Body mass index is 19.42 kg/m.   Wt Readings from Last 3 Encounters:  04/14/20 124 lb (56.2 kg)  03/30/20 135 lb (61.2 kg)  01/28/20 123 lb 7.3 oz (56 kg)     Ht Readings from Last 3 Encounters:  04/14/20 5\' 7"  (1.702 m)  03/30/20 5\' 9"  (1.753 m)  01/28/20 5\' 8"  (1.727 m)      General: The patient is awake, alert and appears not in acute  distress. The patient is well groomed. Head: Normocephalic, atraumatic. Neck is supple. Mallampati 1,  neck circumference:14 inches . Nasal airflow patent.  Retrognathia is mild . Tongue bite mark has healed.  Dental status: intact  Cardiovascular:  Regular rate and cardiac rhythm by pulse,  without distended neck veins. Respiratory: Lungs are clear to auscultation.  Skin:  Without evidence of ankle edema, or rash. Trunk: The patient's posture is erect.   Neurologic exam : The patient is awake and alert, oriented to place and time.   Memory subjective described as intact.  Attention span & concentration ability appears normal.  Speech is fluent,  without dysarthria, dysphonia or aphasia.  Mood and affect are appropriate.   Cranial nerves: no loss of smell or taste reported  Pupils are equal in seize and briskly reactive to light. Funduscopic exam deferred.   Extraocular movements in vertical and horizontal planes were intact and without nystagmus. No Diplopia. Visual fields by finger perimetry are intact. Hearing was intact to soft voice and finger rubbing.    Facial sensation intact to fine touch. Facial motor strength is symmetric and tongue has healed Neck ROM : rotation, tilt and flexion extension were normal for age and shoulder shrug was symmetrical.    Motor exam:  Symmetric bulk, tone and ROM.   Normal tone without cog wheeling, symmetric grip strength .   Sensory:  Fine touch, pinprick and vibration were tested  and  normal.  Proprioception tested in the upper extremities was normal. Coordination: Rapid alternating movements in the fingers/hands were of normal speed.  Deep tendon reflexes: in the  upper and lower extremities are symmetric and intact.  Babinski response was deferred.      After spending a total time of 20 minutes face to face and additional time for physical and neurologic examination, review of laboratory studies,  personal review of imaging studies,  reports and results of other testing and review of referral information / records as far as provided in visit, I have established the following assessments:  1)  Nocturnal/ sleep seizure- in a shift worker with previously high consumption of energy drinks.  He is now takig zonismaide and has been fee of spells for almost 3 .   no family history of epilepsy  Mr kalmar had no seizures since being on medications: His seizures were all sleep related - September 2020 through January 2021.  I will release him to work due to the sleep related nature of his spells, beginning May 3rd. Marland Kitchen   He has been seizure free for 3 month and will stay on Zonisamide.   He was under stress when he had his spells- and was sleep deprived.  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  Return to driving if seizure free in August 1 st. He will have another visit in early august, I refill medication for 6 month.       Electronically signed by: Melvyn Novas, MD 04/14/2020 2:07 PM  [facility] Board certified by The [employer] of Sleep Medicine and Diplomate of the [employer] of Sleep Medicine. Board certified In Neurology through the ABPN, Fellow of the [employer] of Neurology. Medical Director of [facility].

## 2020-04-15 ENCOUNTER — Encounter: Payer: Self-pay | Admitting: Neurology

## 2020-04-21 DIAGNOSIS — Z0289 Encounter for other administrative examinations: Secondary | ICD-10-CM

## 2020-04-22 ENCOUNTER — Telehealth: Payer: Self-pay | Admitting: Neurology

## 2020-04-22 NOTE — Telephone Encounter (Signed)
Received forms to fill out for the patient for his work. Completed and Dr Vickey Huger has signed. Will give these to Stanton Kidney in medical records.

## 2020-04-23 ENCOUNTER — Telehealth: Payer: Self-pay | Admitting: *Deleted

## 2020-04-23 NOTE — Telephone Encounter (Signed)
Pt accommodation form e-mail on 04/23/20

## 2020-04-27 ENCOUNTER — Telehealth: Payer: Self-pay | Admitting: Neurology

## 2020-04-27 ENCOUNTER — Encounter: Payer: Self-pay | Admitting: Neurology

## 2020-04-27 NOTE — Telephone Encounter (Signed)
Called the patient back to advise the letter was written and signed and up at the front for him to pick up. Advised the office will open up at 7 am tomorrow morning.

## 2020-04-27 NOTE — Telephone Encounter (Signed)
Patient walked in requesting a Return to Work letter. 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 Best call back  [phone]

## 2020-06-13 ENCOUNTER — Ambulatory Visit (HOSPITAL_COMMUNITY)
Admission: EM | Admit: 2020-06-13 | Discharge: 2020-06-13 | Disposition: A | Discharge status: Home / Self Care | Payer: Commercial Managed Care - PPO | Attending: Emergency Medicine | Admitting: Emergency Medicine

## 2020-06-13 ENCOUNTER — Encounter (HOSPITAL_COMMUNITY): Payer: Self-pay

## 2020-06-13 ENCOUNTER — Other Ambulatory Visit: Payer: Self-pay

## 2020-06-13 DIAGNOSIS — R42 Dizziness and giddiness: Secondary | ICD-10-CM

## 2020-06-13 DIAGNOSIS — R110 Nausea: Secondary | ICD-10-CM

## 2020-06-13 MED ORDER — ONDANSETRON 4 MG PO TBDP: 4.0000 mg | ORAL_TABLET | Freq: Three times a day (TID) | ORAL | 0 refills | Status: DC | PRN

## 2020-06-13 NOTE — ED Triage Notes (Signed)
Pt presents to UC for weakness, body aches, headaches, dizziness upon standing, nausea. Pt denies sore throat, cough, runny nose, loss of taste or smell. Pt refusing covid testing at this time, states he had covid 1 month ago. Pt Pt has taken tylenol at home, with no relief. Last tylenol 1200 today. Pt denies sick contacts. Pt endorses subjective fevers at home.

## 2020-06-13 NOTE — Discharge Instructions (Signed)
Please use Zofran around-the-clock for nausea/vomiting Focus on fluids, slowly transition to a bland diet-rice, toast, crackers, potatoes If you develop worsening abdominal pain or worsening dizziness/lightheadedness/vomiting please follow-up here or emergency room

## 2020-06-14 NOTE — ED Provider Notes (Signed)
[facility]    CSN: [id number] Arrival date & time: 06/13/20  1651      History   Chief Complaint Chief Complaint  Patient presents with   Nausea    HPI Howard Pena is a 24 y.o. male presenting today for evaluation of nausea and fatigue.  Patient reports that around 230 this afternoon he woke up feeling fatigued, slightly dizzy along with some nausea.  He denies specific vomiting.  Has had associated headache.  Denies URI symptoms of cough congestion or sore throat.  Has previously had Covid.  Denies any close sick contacts.  Denies significant abdominal pain.  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 Authorizing Provider  zonisamide (ZONEGRAN) 50 MG capsule Take 1 capsule (50 mg total) by mouth daily. 04/14/20  Yes Dohmeier, Porfirio Mylar, MD  ondansetron (ZOFRAN ODT) 4 MG disintegrating tablet Take 1 tablet (4 mg total) by mouth every 8 (eight) hours as needed for nausea or vomiting. 06/13/20   Lew Dawes, PA-C    Family History Family History  Problem Relation Age of Onset   Thyroid disease Mother       Social History Social History   Tobacco Use   Smoking status: Current Every Day Smoker   Smokeless tobacco: Never Used  Substance Use Topics   Alcohol use: Yes    Comment: occasional   Drug use: No     Allergies   Amoxicillin   Review of Systems Review of Systems  Constitutional: Positive for appetite change, fatigue and fever. Negative for activity change and chills.  HENT: Negative for congestion, ear pain, rhinorrhea, sinus pressure, sore throat and trouble swallowing.   Eyes: Negative for discharge and redness.  Respiratory: Negative for cough, chest tightness and shortness of breath.   Cardiovascular: Negative for chest pain.  Gastrointestinal: Positive for nausea. Negative for abdominal pain, diarrhea and vomiting.  Musculoskeletal: Negative for myalgias.  Skin: Negative for rash.  Neurological: Positive for light-headedness. Negative for dizziness and headaches.     Physical Exam Triage Vital Signs ED Triage Vitals  Enc Vitals Group     BP 06/13/20 1802 122/77     Pulse Rate 06/13/20 1801 71     Resp 06/13/20 1801 16     Temp 06/13/20 1801 98.8 F (37.1 C)     Temp Source 06/13/20 1801 Oral     SpO2 06/13/20 1801 100 %     Weight --      Height --      Head Circumference --  Peak Flow --      Pain Score 06/13/20 1803 7     Pain Loc --      Pain Edu? --      Excl. in [place]? --    No data found.  Updated Vital Signs BP 122/77 (BP Location: Left Arm)    Pulse 71    Temp 98.8 F (37.1 C) (Oral)    Resp 16    SpO2 100%   Visual Acuity Right Eye Distance:   Left Eye Distance:   Bilateral Distance:    Right Eye Near:   Left Eye Near:    Bilateral Near:     Physical Exam Vitals and nursing note reviewed.  Constitutional:      Appearance: He is well-developed.     Comments: No acute distress  HENT:     Head: Normocephalic and atraumatic.     Ears:     Comments: Bilateral ears without tenderness to palpation of external auricle, tragus  and mastoid, EAC's without erythema or swelling, TM's with good bony landmarks and cone of light. Non erythematous.     Nose: Nose normal.     Mouth/Throat:     Comments: Oral mucosa pink and moist, no tonsillar enlargement or exudate. Posterior pharynx patent and nonerythematous, no uvula deviation or swelling. Normal phonation. Eyes:     Conjunctiva/sclera: Conjunctivae normal.  Cardiovascular:     Rate and Rhythm: Normal rate.  Pulmonary:     Effort: Pulmonary effort is normal. No respiratory distress.     Comments: Breathing comfortably at rest, CTABL, no wheezing, rales or other adventitious sounds auscultated Abdominal:     General: There is no distension.     Comments: Soft, nondistended, nontender to light and deep palpation throughout abdomen  Musculoskeletal:        General: Normal range of motion.     Cervical back: Neck supple.  Skin:    General: Skin is warm and dry.  Neurological:     Mental Status: He is alert and oriented to person, place, and time.      UC Treatments / Results  Labs (all labs ordered are listed, but only abnormal results are displayed) Labs Reviewed - No data to display  EKG   Radiology No results found.  Procedures Procedures (including critical care time)  Medications Ordered in UC Medications - No data to display  Initial Impression / Assessment and Plan / UC Course  I have reviewed the triage vital signs and the nursing notes.  Pertinent labs & imaging results that were available during my care of the patient were reviewed by me and considered in my medical decision making (see chart for details).     Less than 12 hours of nausea and fatigue.  No abdominal tenderness.  Vital signs stable.  Suspect likely viral etiology and recommend symptomatic and supportive care with close monitoring.  Zofran as needed for nausea.  Slowly transition diet, focus on fluids/oral rehydration.  Discussed strict return precautions. Patient  verbalized understanding and is agreeable with plan.  Final Clinical Impressions(s) / UC Diagnoses   Final diagnoses:  Nausea  Lightheadedness     Discharge Instructions     Please use Zofran around-the-clock for nausea/vomiting Focus on fluids, slowly transition to a bland diet-rice, toast, crackers, potatoes If you develop worsening abdominal pain or worsening dizziness/lightheadedness/vomiting please follow-up here or emergency room   ED Prescriptions    Medication Sig Dispense Auth. Provider   ondansetron (ZOFRAN ODT) 4 MG  disintegrating tablet Take 1 tablet (4 mg total) by mouth every 8 (eight) hours as needed for nausea or vomiting. 20 tablet Adaijah Endres, [place] C, PA-C     PDMP not reviewed this encounter.   Lew Dawes, [place] 06/14/20 1112

## 2021-04-19 ENCOUNTER — Other Ambulatory Visit: Payer: Self-pay

## 2021-04-19 ENCOUNTER — Encounter (HOSPITAL_COMMUNITY): Payer: Self-pay

## 2021-04-19 ENCOUNTER — Ambulatory Visit (INDEPENDENT_AMBULATORY_CARE_PROVIDER_SITE_OTHER): Payer: Managed Care, Other (non HMO)

## 2021-04-19 ENCOUNTER — Ambulatory Visit (HOSPITAL_COMMUNITY)
Admission: EM | Admit: 2021-04-19 | Discharge: 2021-04-19 | Disposition: A | Discharge status: Home / Self Care | Payer: Managed Care, Other (non HMO) | Attending: Student | Admitting: Student

## 2021-04-19 DIAGNOSIS — M79641 Pain in right hand: Secondary | ICD-10-CM | POA: Diagnosis not present

## 2021-04-19 DIAGNOSIS — W2209XA Striking against other stationary object, initial encounter: Secondary | ICD-10-CM | POA: Diagnosis not present

## 2021-04-19 NOTE — ED Triage Notes (Signed)
Pt is here with a right hand injury after a glass plate falling and breaking, he got mad and punched the wall. Pt has not taken any meds to relieve discomfort, this happened today.

## 2021-04-19 NOTE — ED Provider Notes (Signed)
[facility]    CSN: [id number] Arrival date & time: 04/19/21  1954      History   Chief Complaint Chief Complaint  Patient presents with  . Hand Injury    HPI Howard Pena is a 25 y.o. male presenting with hand injury.  States he dropped a plate this morning and got angry so then punched a wall with his right hand.  Pain on the lateral aspect of the hand since then.  Able to move his hand but with pain.  Has not taken any medications for it.  Denies sensation changes.  Denies injury elsewhere.  Screw in place from similar injury in the past.  HPI  Past Medical History:  Diagnosis Date  . Constitutional growth delay   . Goiter   . Physical growth delay   . Poor appetite   . Puberty delay     Patient Active Problem List   Diagnosis Date Noted  . Sleep deprivation seizure (HCC) 04/14/2020  . 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 Shifting sleep-work schedule, affecting sleep 02/07/2020  . 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 Peritonsillar abscess 01/28/2020  . Physical growth delay   . Constitutional growth delay   . Goiter   . Poor appetite   . Puberty delay   . Lack of expected normal physiological development in childhood 04/21/2011  . Goiter, unspecified 04/21/2011  . 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 right ear surgery         Home Medications    Prior to Admission medications   Medication Sig Start Date End Date Taking? Authorizing Provider  ondansetron (ZOFRAN ODT) 4 MG disintegrating tablet Take 1 tablet (4 mg total) by mouth every 8 (eight) hours as needed for nausea or vomiting. 06/13/20   Wieters, Hallie C, PA-C  zonisamide (ZONEGRAN) 50 MG capsule Take 1 capsule (50 mg total) by mouth daily. 04/14/20   Dohmeier, Porfirio Mylar, MD    Family History Family History  Problem Relation Age of Onset  . Thyroid disease Mother   . Healthy Father      Social History Social History   Tobacco Use  . Smoking status: Former Games developer  . Smokeless tobacco: Never Used  . Tobacco comment: quit 2 months ago  Vaping Use  . Vaping Use: Every day  Substance Use Topics  . Alcohol use: Yes  . Drug use: No     Allergies   Amoxicillin   Review of Systems Review of Systems  Musculoskeletal:       R hand pain  All other systems reviewed and are negative.    Physical Exam Triage Vital Signs ED Triage Vitals  Enc Vitals Group     BP      Pulse      Resp      Temp      Temp src      SpO2      Weight      Height      Head Circumference      Peak Flow      Pain Score      Pain Loc      Pain Edu?      Excl. in GC?    No data found.  Updated Vital Signs BP 128/81 (BP Location: Right Arm)   Pulse (!) 103   Temp 97.8 F (36.6 C) (Oral)   Resp 17  SpO2 98%   Visual Acuity Right Eye Distance:   Left Eye Distance:   Bilateral Distance:    Right Eye Near:   Left Eye Near:    Bilateral Near:     Physical Exam Vitals reviewed.  Constitutional:      General: He is not in acute distress.    Appearance: Normal appearance. He is not ill-appearing or diaphoretic.  HENT:     Head: Normocephalic and atraumatic.  Cardiovascular:     Rate and Rhythm: Normal rate and regular rhythm.     Heart sounds: Normal heart sounds.  Pulmonary:     Effort: Pulmonary effort is normal.     Breath sounds: Normal breath sounds.  Musculoskeletal:     Comments: Right hand- tenderness to palpation along fifth metacarpal, scant effusion.  No laceration or abrasion.  No obvious bony deformity.  No tenderness of the fingers (PIP and DIP joints).  Range of motion fingers  intact but with pain. ROM wrist intact and without pain. Grip strength 5/5. Radial pulse 2+, cap refil <2 seconds. Neurovascularly intact. No snuffbox tenderness.  Skin:    General: Skin is warm.     Capillary Refill: Capillary refill takes less than 2 seconds.   Neurological:     General: No focal deficit present.     Mental Status: He is alert and oriented to person, place, and time.  Psychiatric:        Mood and Affect: Mood normal.        Behavior: Behavior normal.        Thought Content: Thought content normal.        Judgment: Judgment normal.      UC Treatments / Results  Labs (all labs ordered are listed, but only abnormal results are displayed) Labs Reviewed - No data to display  EKG   Radiology DG Hand Complete Right  Result Date: 04/19/2021 CLINICAL DATA:  Punched a wall EXAM: RIGHT HAND - COMPLETE 3+ VIEW COMPARISON:  None. FINDINGS: There is no evidence of fracture or dislocation. There is no evidence of arthropathy or other focal bone abnormality. Soft tissues are unremarkable. Surgical screw in the proximal right first metacarpal. IMPRESSION: Negative. Electronically Signed   By: Deatra Robinson M.D.   On: 04/19/2021 20:38    Procedures Procedures (including critical care time)  Medications Ordered in UC Medications - No data to display  Initial Impression / Assessment and Plan / UC Course  I have reviewed the triage vital signs and the nursing notes.  Pertinent labs & imaging results that were available during my care of the patient were reviewed by me and considered in my medical decision making (see chart for details).     This patient is a 25 year old male presenting with tenderness along his fifth metacarpal following punching a wall.  Neurovascularly intact.  Xray right hand- negative.   Wrapped in Ace wrap.  Tylenol/ibuprofen for pain, RICE.  Follow-up with hand specialist if symptoms persist; information provided.  This patient has an active job and so I do advise medical clearance before returning to work.  Note provided.  Final Clinical Impressions(s) / UC Diagnoses   Final diagnoses:  Right hand pain     Discharge Instructions     -Use wrap/brace while you're having pain  -For pain, Take Tylenol  1000 mg 3 times daily, and ibuprofen 800 mg 3 times daily with food.  You can take these together, or alternate every 3-4 hours. You can also try  ice.  -Don't do anything that hurts! Remain out of work until you receive medical clearance.  -Follow-up with hand specialist in 4-5 days if symptoms persist. Information below.    ED Prescriptions    None     PDMP not reviewed this encounter.   Rhys Martini, PA-C 04/19/21 2053

## 2021-04-19 NOTE — Discharge Instructions (Addendum)
-  Use wrap/brace while you're having pain  -For pain, Take Tylenol 1000 mg 3 times daily, and ibuprofen 800 mg 3 times daily with food.  You can take these together, or alternate every 3-4 hours. You can also try ice.  -Don't do anything that hurts! Remain out of work until you receive medical clearance.  -Follow-up with hand specialist in 4-5 days if symptoms persist. Information below.

## 2021-04-25 ENCOUNTER — Other Ambulatory Visit: Payer: Self-pay

## 2021-04-25 ENCOUNTER — Ambulatory Visit (HOSPITAL_COMMUNITY)
Admission: EM | Admit: 2021-04-25 | Discharge: 2021-04-25 | Disposition: A | Discharge status: Home / Self Care | Payer: Managed Care, Other (non HMO) | Attending: Sports Medicine | Admitting: Sports Medicine

## 2021-05-21 ENCOUNTER — Emergency Department (HOSPITAL_COMMUNITY)
Admission: EM | Admit: 2021-05-21 | Discharge: 2021-05-21 | Disposition: A | Discharge status: Home / Self Care | Payer: Managed Care, Other (non HMO) | Attending: Emergency Medicine | Admitting: Emergency Medicine

## 2021-05-21 ENCOUNTER — Other Ambulatory Visit: Payer: Self-pay

## 2021-05-21 ENCOUNTER — Encounter (HOSPITAL_COMMUNITY): Payer: Self-pay

## 2021-05-21 DIAGNOSIS — R509 Fever, unspecified: Secondary | ICD-10-CM

## 2021-05-21 DIAGNOSIS — R42 Dizziness and giddiness: Secondary | ICD-10-CM | POA: Insufficient documentation

## 2021-05-21 DIAGNOSIS — Z87891 Personal history of nicotine dependence: Secondary | ICD-10-CM | POA: Diagnosis not present

## 2021-05-21 DIAGNOSIS — U071 COVID-19: Secondary | ICD-10-CM | POA: Diagnosis not present

## 2021-05-21 LAB — BASIC METABOLIC PANEL
Anion gap: 8 (ref 5–15)
BUN: 15 mg/dL (ref 6–20)
CO2: 25 mmol/L (ref 22–32)
Calcium: 9 mg/dL (ref 8.9–10.3)
Chloride: 103 mmol/L (ref 98–111)
Creatinine, Ser: 1.18 mg/dL (ref 0.61–1.24)
GFR, Estimated: >60 mL/min (ref >60)
Glucose, Bld: 95 mg/dL (ref 70–99)
Potassium: 3.5 mmol/L (ref 3.5–5.1)
Sodium: 136 mmol/L (ref 135–145)

## 2021-05-21 LAB — URINALYSIS, ROUTINE W REFLEX MICROSCOPIC
Bilirubin Urine: NEGATIVE
Glucose, UA: 150 mg/dL — AB
Hgb urine dipstick: NEGATIVE
Ketones, ur: 5 mg/dL — AB
Leukocytes,Ua: NEGATIVE
Nitrite: NEGATIVE
Protein, ur: NEGATIVE mg/dL
Specific Gravity, Urine: 1.03 (ref 1.005–1.030)
pH: 5 (ref 5.0–8.0)

## 2021-05-21 LAB — CBC WITH DIFFERENTIAL/PLATELET
Abs Immature Granulocytes: 0.01 10*3/uL (ref 0.00–0.07)
Basophils Absolute: 0 10*3/uL (ref 0.0–0.1)
Basophils Relative: 0 %
Eosinophils Absolute: 0 10*3/uL (ref 0.0–0.5)
Eosinophils Relative: 0 %
HCT: 39.2 % (ref 39.0–52.0)
Hemoglobin: 13.2 g/dL (ref 13.0–17.0)
Immature Granulocytes: 0 %
Lymphocytes Relative: 9 %
Lymphs Abs: 0.3 10*3/uL — ABNORMAL LOW (ref 0.7–4.0)
MCH: 28.3 pg (ref 26.0–34.0)
MCHC: 33.7 g/dL (ref 30.0–36.0)
MCV: 83.9 fL (ref 80.0–100.0)
Monocytes Absolute: 0.6 10*3/uL (ref 0.1–1.0)
Monocytes Relative: 15 %
Neutro Abs: 2.9 10*3/uL (ref 1.7–7.7)
Neutrophils Relative %: 76 %
Platelets: 213 10*3/uL (ref 150–400)
RBC: 4.67 MIL/uL (ref 4.22–5.81)
RDW: 12.4 % (ref 11.5–15.5)
WBC: 3.8 10*3/uL — ABNORMAL LOW (ref 4.0–10.5)
nRBC: 0 % (ref 0.0–0.2)

## 2021-05-21 LAB — SARS CORONAVIRUS 2 (TAT 6-24 HRS): SARS Coronavirus 2: POSITIVE — AB

## 2021-05-21 MED ORDER — LACTATED RINGERS IV BOLUS
1000.0000 mL | Freq: Once | INTRAVENOUS | Status: AC
  Administered 2021-05-21: 1000 mL via INTRAVENOUS

## 2021-05-21 MED ORDER — ACETAMINOPHEN 325 MG PO TABS
650.0000 mg | ORAL_TABLET | Freq: Once | ORAL | Status: AC | PRN
  Administered 2021-05-21: 650 mg via ORAL
  Filled 2021-05-21: qty 2

## 2021-05-21 MED ORDER — KETOROLAC TROMETHAMINE 30 MG/ML IJ SOLN
30.0000 mg | Freq: Once | INTRAMUSCULAR | Status: AC
  Administered 2021-05-21: 30 mg via INTRAVENOUS
  Filled 2021-05-21: qty 1

## 2021-05-21 NOTE — ED Triage Notes (Signed)
Headache and light headedness started this afternoon (5pm) with nausea and vomiting. Non productive cough.   In triage with temp of 101.1. unvaccinated.

## 2021-05-21 NOTE — ED Provider Notes (Signed)
[facility] EMERGENCY DEPARTMENT Provider Note   CSN: [id number] Arrival date & time: 05/21/21  0147     History Chief Complaint  Patient presents with  . Headache  . Dizziness    Howard Pena is a 25 y.o. male.   Fever Max temp prior to arrival:  103 Temp source:  Oral Severity:  Moderate Duration:  1 day Timing:  Constant Chronicity:  New Relieved by:  None tried Ineffective treatments:  None tried Associated symptoms: no chest pain        Past Medical History:  Diagnosis Date  . Constitutional growth delay   . Goiter   . Physical growth delay   . Poor appetite   . Puberty delay     Patient Active Problem List   Diagnosis Date Noted  . Sleep deprivation seizure (HCC) 04/14/2020  . 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 Shifting sleep-work schedule, affecting sleep 02/07/2020  . 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 Peritonsillar abscess 01/28/2020  . Physical growth delay   . Constitutional growth delay   . Goiter   . Poor appetite   . Puberty delay   . Lack of expected normal physiological development in childhood 04/21/2011  . Goiter, unspecified 04/21/2011  . 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 right ear surgery         Family History  Problem Relation Age of Onset  . Thyroid disease Mother   . Healthy Father     Social History   Tobacco Use  . Smoking status: Former Games developer  . Smokeless tobacco: Never Used  . Tobacco comment: quit 2 months ago  Vaping Use  . Vaping Use: Every day  Substance Use Topics  . Alcohol use: Yes  . Drug use: No    Home Medications Prior to Admission medications   Medication Sig Start Date End Date Taking? Authorizing Provider  ondansetron (ZOFRAN ODT) 4 MG disintegrating tablet Take 1 tablet (4 mg total) by mouth every 8 (eight) hours as needed for nausea or vomiting. 06/13/20   Wieters,  Hallie C, PA-C  zonisamide (ZONEGRAN) 50 MG capsule Take 1 capsule (50 mg total) by mouth daily. 04/14/20   Dohmeier, Porfirio Mylar, MD    Allergies    Amoxicillin  Review of Systems   Review of Systems  Constitutional: Positive for fever.  Cardiovascular: Negative for chest pain.  All other systems reviewed and are negative.   Physical Exam Updated Vital Signs BP 120/70 (BP Location: Right Arm)   Pulse 86   Temp 98.5 F (36.9 C) (Oral)   Resp (!) 22   Ht 5\' 7"  (1.702 m)   Wt 56.7 kg   SpO2 95%   BMI 19.58 kg/m   Physical Exam Vitals and nursing note reviewed.  Constitutional:      Appearance: He is well-developed.  HENT:     Head: Normocephalic and atraumatic.     Mouth/Throat:     Mouth: Mucous membranes are moist.     Pharynx: Oropharynx is clear.  Eyes:     Pupils: Pupils are equal, round, and reactive to light.  Cardiovascular:     Rate and Rhythm: Normal rate.  Pulmonary:     Effort: Pulmonary effort is normal. No respiratory distress.  Abdominal:     General: Abdomen is flat. There is no distension.  Musculoskeletal:        General: Normal range of  motion.     Cervical back: Normal range of motion.  Skin:    General: Skin is warm and dry.     Coloration: Skin is not pale.  Neurological:     General: No focal deficit present.     Mental Status: He is alert.     Comments: No nuchal rigidity     ED Results / Procedures / Treatments   Labs (all labs ordered are listed, but only abnormal results are displayed) Labs Reviewed  SARS CORONAVIRUS 2 (TAT 6-24 HRS) - Abnormal; Notable for the following components:      Result Value   SARS Coronavirus 2 POSITIVE (*)    All other components within normal limits  CBC WITH DIFFERENTIAL/PLATELET - Abnormal; Notable for the following components:   WBC 3.8 (*)    Lymphs Abs 0.3 (*)    All other components within normal limits  URINALYSIS, ROUTINE W REFLEX MICROSCOPIC - Abnormal; Notable for the following components:    APPearance HAZY (*)    Glucose, UA 150 (*)    Ketones, ur 5 (*)    All other components within normal limits  BASIC METABOLIC PANEL    EKG None  Radiology No results found.  Procedures Procedures   Medications Ordered in ED Medications  acetaminophen (TYLENOL) tablet 650 mg (650 mg Oral Given 05/21/21 0347)  ketorolac (TORADOL) 30 MG/ML injection 30 mg (30 mg Intravenous Given 05/21/21 0524)  lactated ringers bolus 1,000 mL (0 mLs Intravenous Stopped 05/21/21 2751)    ED Course  I have reviewed the triage vital signs and the nursing notes.  Pertinent labs & imaging results that were available during my care of the patient were reviewed by me and considered in my medical decision making (see chart for details).    MDM Rules/Calculators/A&P                          Doubt meningitis. Symptoms consistent with likely viral illness vs covid. Pending results, patient will follow up on results.   Final Clinical Impression(s) / ED Diagnoses Final diagnoses:  Fever, unspecified fever cause    Rx / DC Orders ED Discharge Orders    None       Anfernee Peschke, Barbara Cower, MD 05/22/21 0532

## 2021-05-21 NOTE — ED Notes (Signed)
Pt ambulated to restroom. 

## 2021-05-21 NOTE — ED Notes (Signed)
Urine Requested.  

## 2021-06-17 IMAGING — DX DG HAND COMPLETE 3+V*R*
3 series · 3 of 3 positions shown · non-contrast
Comparison: None.

CLINICAL DATA: Punched a wall

EXAM:
RIGHT HAND - COMPLETE 3+ VIEW

[hand pa]
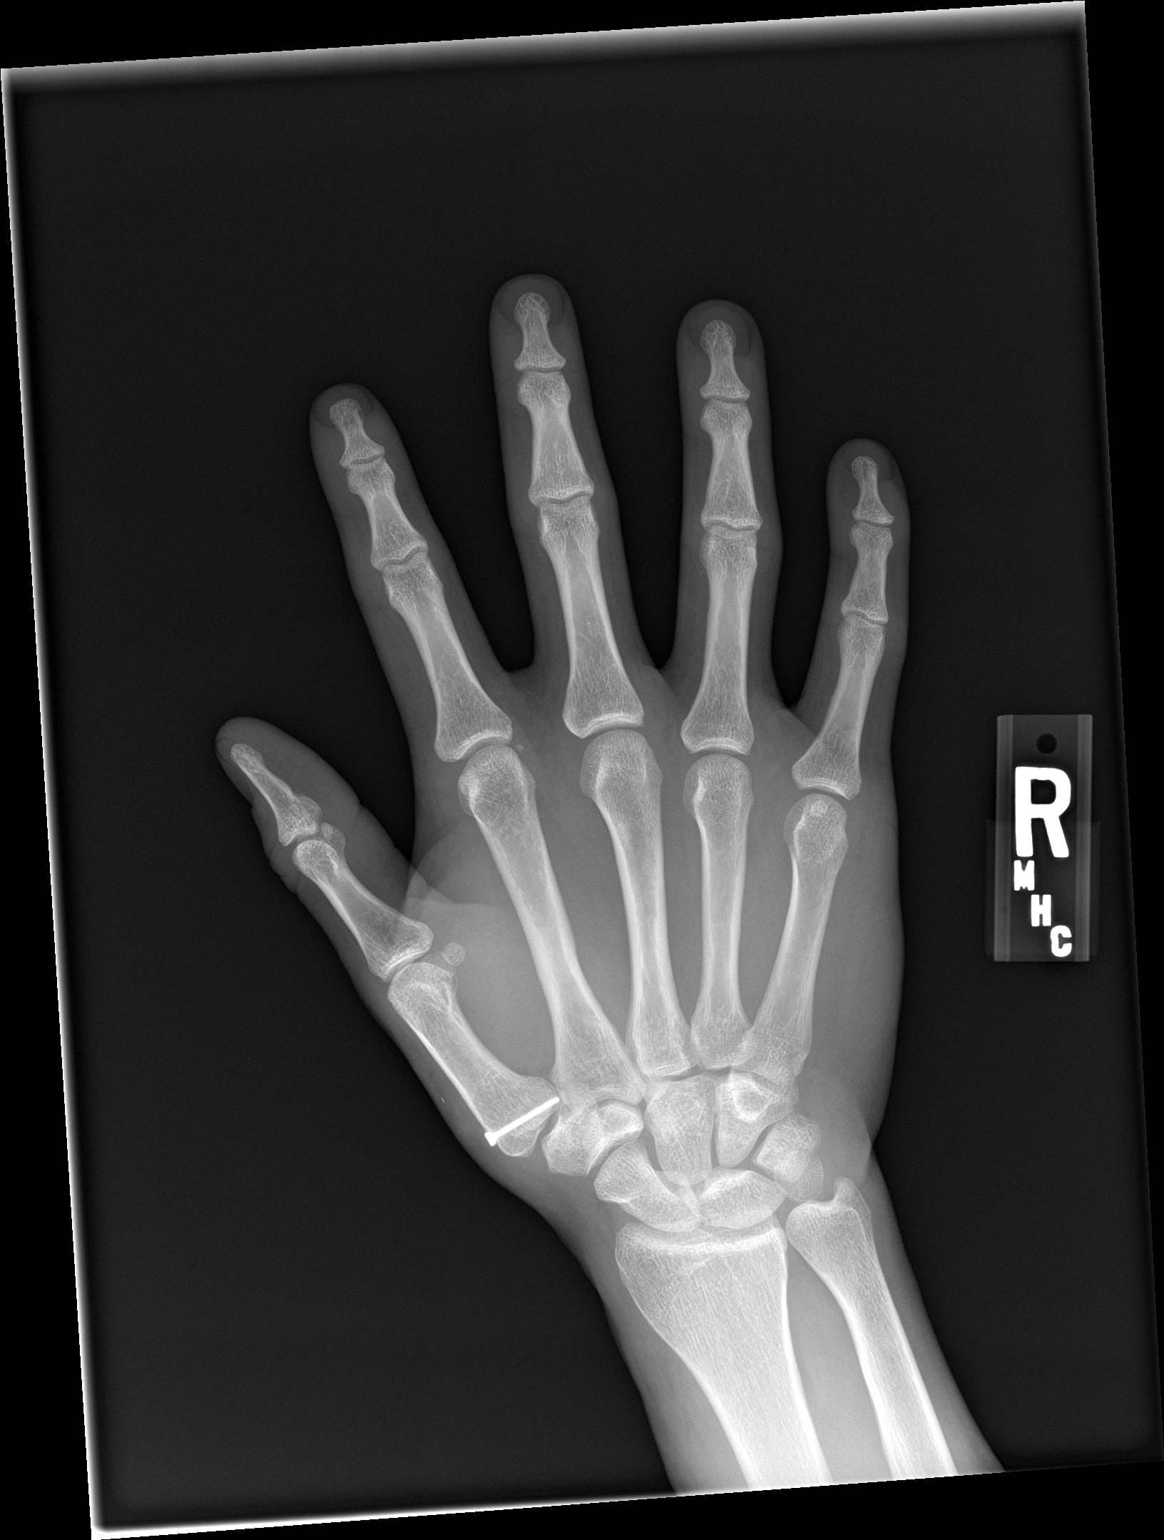

[hand obl]
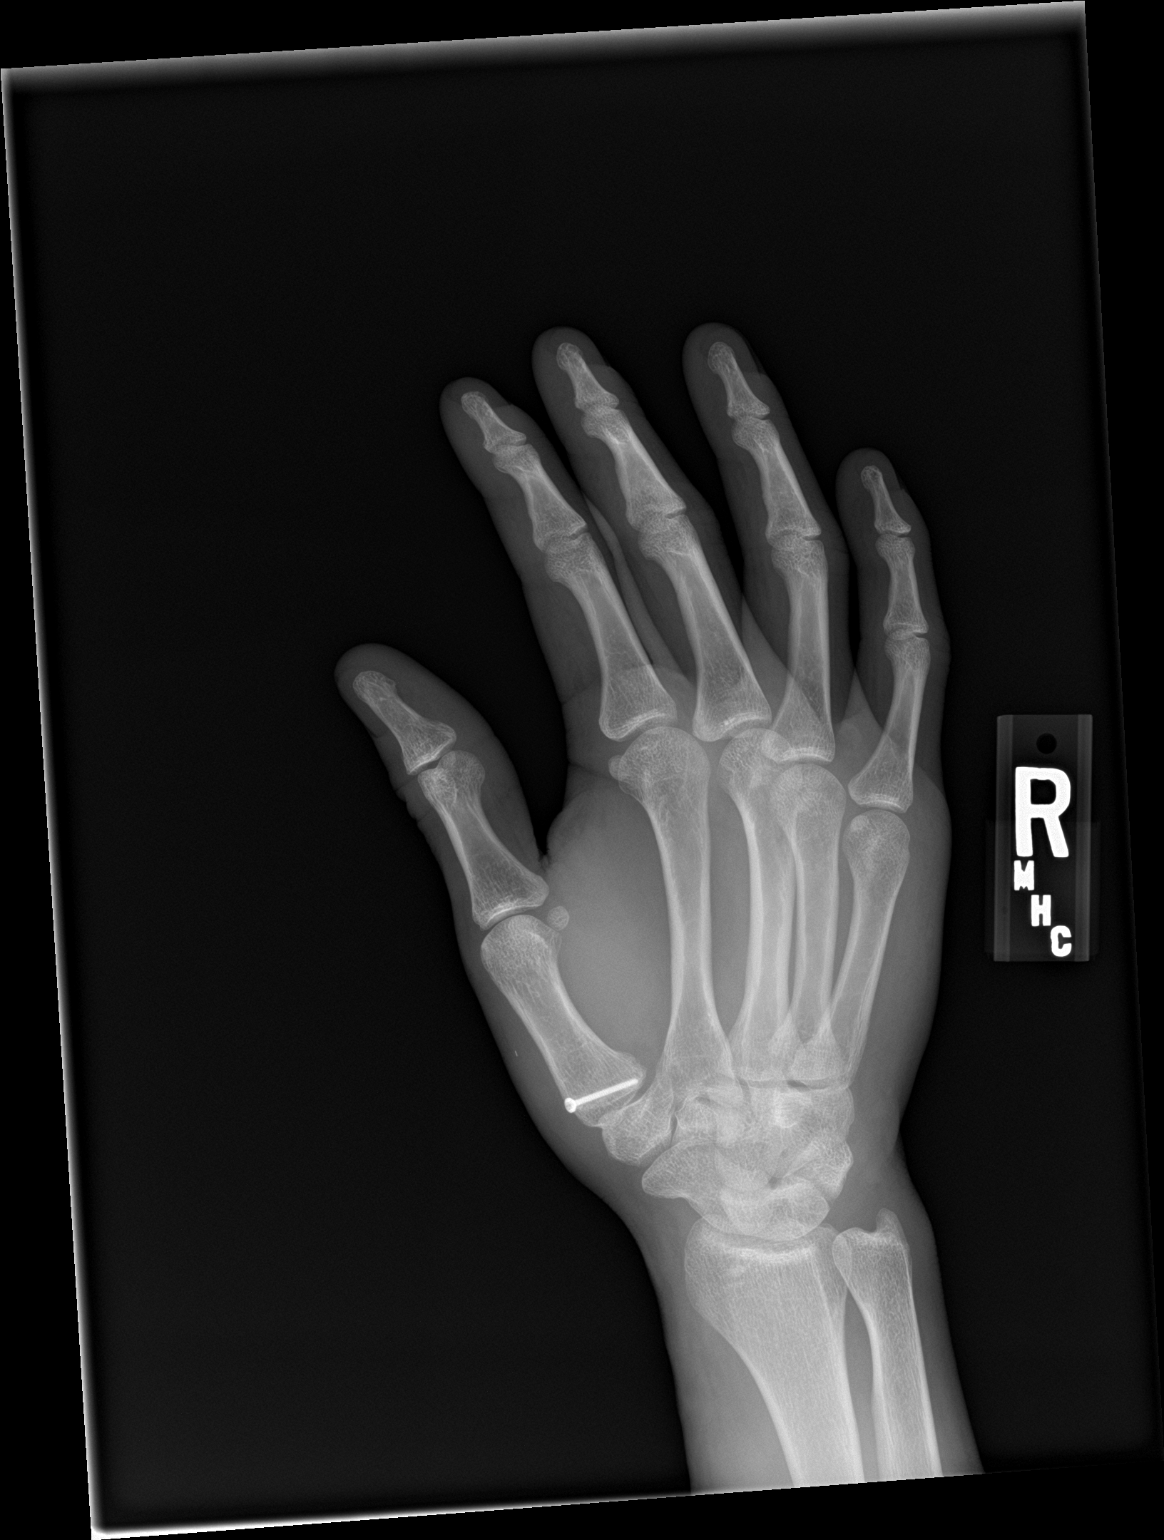

[hand lat]
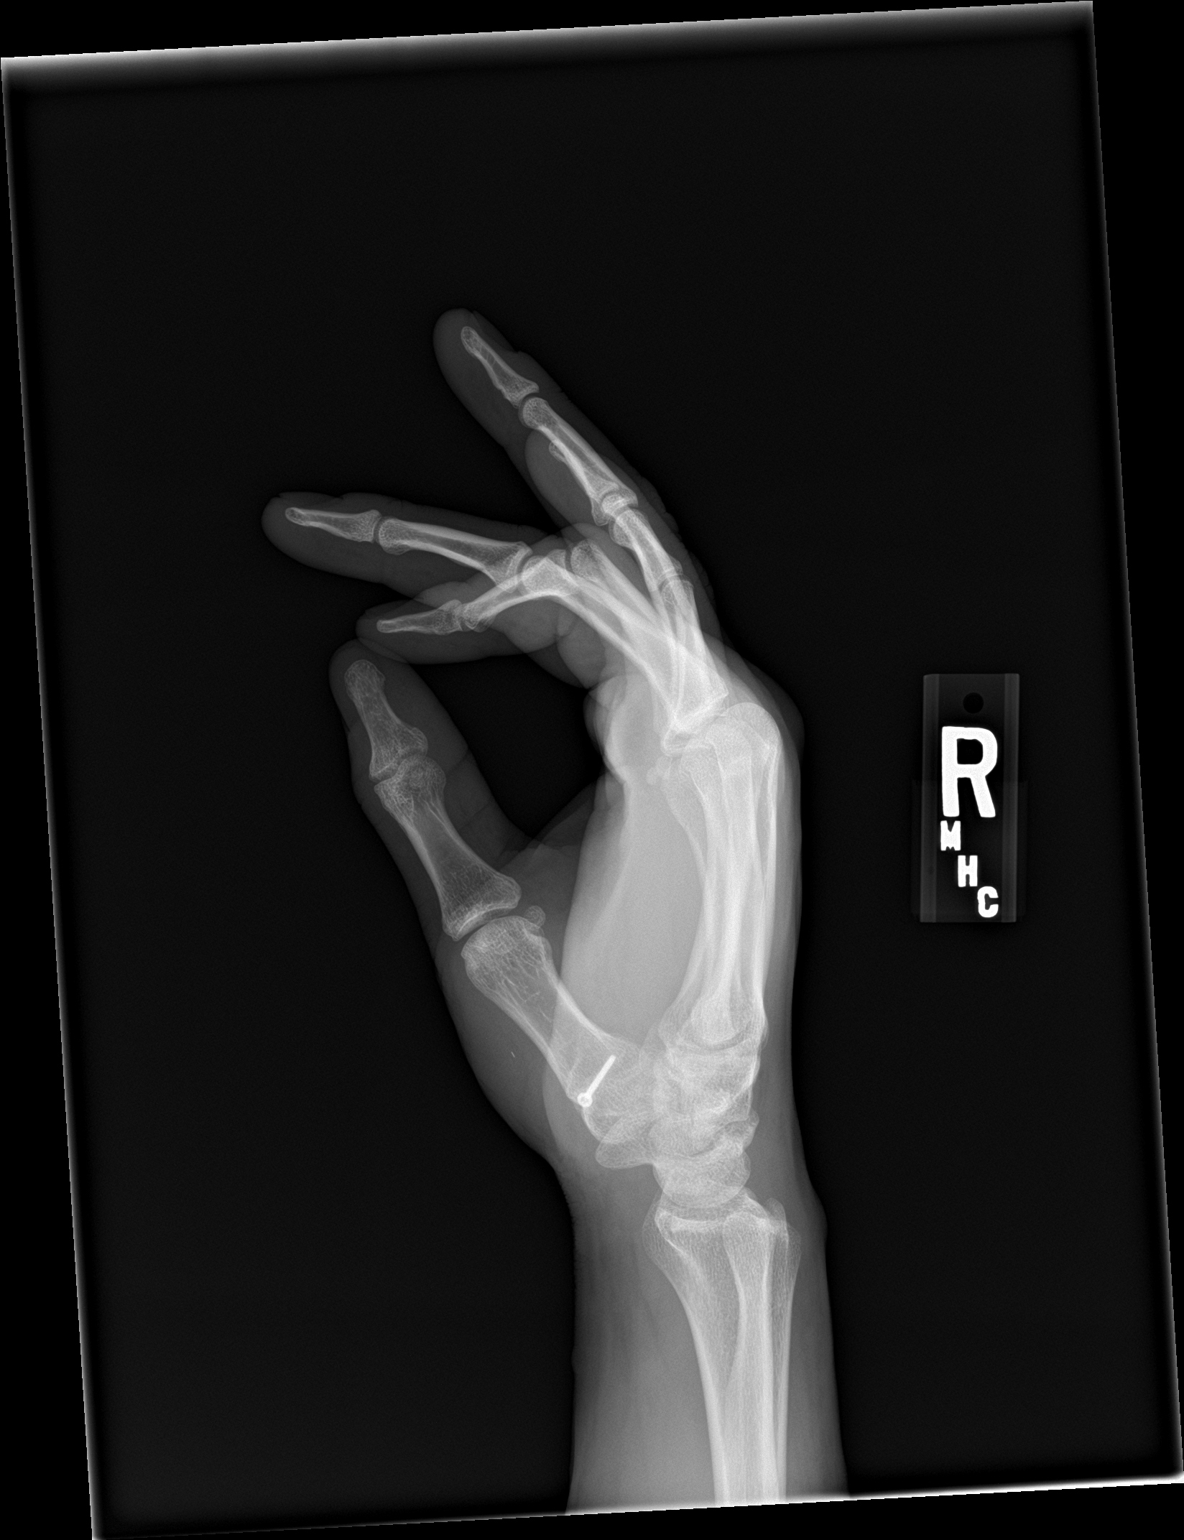

[3 of 3 positions shown; findings below may reference images not displayed]

FINDINGS: There is no evidence of fracture or dislocation. There is no
evidence of arthropathy or other focal bone abnormality. Soft
tissues are unremarkable. Surgical screw in the proximal right first
metacarpal.
IMPRESSION: Negative.

## 2021-12-16 ENCOUNTER — Other Ambulatory Visit: Payer: Self-pay | Admitting: *Deleted

## 2021-12-16 DIAGNOSIS — S0083XA Contusion of other part of head, initial encounter: Secondary | ICD-10-CM

## 2021-12-16 DIAGNOSIS — S0093XA Contusion of unspecified part of head, initial encounter: Secondary | ICD-10-CM

## 2022-11-17 ENCOUNTER — Encounter: Payer: Self-pay | Admitting: Nurse Practitioner

## 2022-11-17 ENCOUNTER — Ambulatory Visit: Payer: Managed Care, Other (non HMO) | Admitting: Nurse Practitioner

## 2022-11-17 VITALS — BP 132/78 | HR 87 | Temp 98.5°F | Resp 12 | Ht 67.0 in | Wt 129.4 lb

## 2022-11-17 DIAGNOSIS — R194 Change in bowel habit: Secondary | ICD-10-CM | POA: Diagnosis not present

## 2022-11-17 DIAGNOSIS — M24211 Disorder of ligament, right shoulder: Secondary | ICD-10-CM | POA: Diagnosis not present

## 2022-11-17 DIAGNOSIS — E049 Nontoxic goiter, unspecified: Secondary | ICD-10-CM

## 2022-11-17 DIAGNOSIS — Z23 Encounter for immunization: Secondary | ICD-10-CM

## 2022-11-17 DIAGNOSIS — R569 Unspecified convulsions: Secondary | ICD-10-CM | POA: Diagnosis not present

## 2022-11-17 DIAGNOSIS — Z7689 Persons encountering health services in other specified circumstances: Secondary | ICD-10-CM

## 2022-11-17 LAB — COMPREHENSIVE METABOLIC PANEL
ALT: 17 U/L (ref 0–53)
AST: 22 U/L (ref 0–37)
Albumin: 4.8 g/dL (ref 3.5–5.2)
Alkaline Phosphatase: 67 U/L (ref 39–117)
BUN: 25 mg/dL — ABNORMAL HIGH (ref 6–23)
CO2: 30 mEq/L (ref 19–32)
Calcium: 9.7 mg/dL (ref 8.4–10.5)
Chloride: 100 mEq/L (ref 96–112)
Creatinine, Ser: 0.98 mg/dL (ref 0.40–1.50)
GFR: 105.5 mL/min (ref >60.00)
Glucose, Bld: 72 mg/dL (ref 70–99)
Potassium: 4.5 mEq/L (ref 3.5–5.1)
Sodium: 137 mEq/L (ref 135–145)
Total Bilirubin: 0.6 mg/dL (ref 0.2–1.2)
Total Protein: 7.1 g/dL (ref 6.0–8.3)

## 2022-11-17 LAB — TSH: TSH: 0.91 u[IU]/mL (ref 0.35–5.50)

## 2022-11-17 LAB — CBC
HCT: 42.7 % (ref 39.0–52.0)
Hemoglobin: 14 g/dL (ref 13.0–17.0)
MCHC: 32.7 g/dL (ref 30.0–36.0)
MCV: 88.8 fl (ref 78.0–100.0)
Platelets: 292 10*3/uL (ref 150.0–400.0)
RBC: 4.81 Mil/uL (ref 4.22–5.81)
RDW: 13.4 % (ref 11.5–15.5)
WBC: 4.6 10*3/uL (ref 4.0–10.5)

## 2022-11-17 LAB — SEDIMENTATION RATE: Sed Rate: 5 mm/hr (ref 0–15)

## 2022-11-17 MED ORDER — LEVETIRACETAM 750 MG PO TABS: 750.0000 mg | ORAL_TABLET | Freq: Every day | ORAL | 1 refills | Status: DC

## 2022-11-17 NOTE — Assessment & Plan Note (Signed)
Patient states he has been seizure-free for approximate 1 and half years.  Maintained on levetiracetam 750 mg daily.  Patient request refill.  Not currently followed by neurology.  Refill provided today

## 2022-11-17 NOTE — Patient Instructions (Signed)
Nice to see you today I want to see you in 6 months for your physical When you leave make an appointment with Dr. Karleen Hampshire Copland about your shoulder I will be in touch with the lab results once I have them

## 2022-11-17 NOTE — Assessment & Plan Note (Signed)
Patient Howard Pena is frequent bowel movements approximately 8 times a day.  No blood no pain not related to food per his report no diarrhea consistently.  Will check ESR and basic blood work today ambulatory referral to gastro placed

## 2022-11-17 NOTE — Progress Notes (Signed)
Established Patient Office Visit  Subjective   Patient ID: Howard Pena, male    DOB: 09/21/1996  Age: 26 y.o. MRN: [id number]  Chief Complaint  Patient presents with   Establish Care    Previous PCP [place] [facility]   Shoulder Pain    Right shoulder pops in and out of the socket, x 1 1/2 years-happened at work after pulling boxes and the way he turned it popped shoulder out   frequent bowel movements    Since 2019 or 2020-about 8 BMs a day.      Seizures: 1.5 years since last seizure.  Patient was followed by neurology but has not seen them since he has been seizure-free.  Currently maintained on levetiracetam 750 mg daily.  Patient requests refill today.  Shoulder pain; right shoulder. 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 states that he has a hook to pull the milk creates out to load and he uses his right arm for the hook and pull the crates with. States that his right shoulder will come in and out of place. [facility] that [facility] and [facility] an xray approx 1 month ago. States that it slipped out approx a few days ago.  No nunbness tingling or weakenss  Bowel movements:states that he has approx 8 BMs a day. States that he has no blood and formed. States that a few times it will be runny. No belly pain or cramping. Does not notice any change with foods   Immunizations: -Tetanus: update today -Influenza: refused -Shingles: too young  -Pneumonia: too young  -Covid : refused  -HPV:    Review of Systems  Constitutional:  Negative for chills and fever.  Respiratory:  Negative for shortness of breath.   Cardiovascular:  Negative for chest pain.  Gastrointestinal:  Positive for diarrhea. Negative for abdominal pain, nausea and vomiting.  Genitourinary:  Negative for dysuria and hematuria.  Neurological:  Positive for seizures. Negative for tingling and headaches.   Psychiatric/Behavioral:  Negative for hallucinations and suicidal ideas.       Objective:     BP 132/78   Pulse 87   Temp 98.5 F (36.9 C)   Resp 12   Ht _0  (1.702 m)   Wt 129 lb 6 oz (58.7 kg)   SpO2 97%   BMI 20.26 kg/m    Physical Exam Vitals and nursing note reviewed.  Constitutional:      Appearance: Normal appearance.  HENT:     Right Ear: Tympanic membrane, ear canal and external ear normal.     Left Ear: Tympanic membrane, ear canal and external ear normal.     Mouth/Throat:     Mouth: Mucous membranes are moist.     Pharynx: Oropharynx is clear.  Eyes:     Extraocular Movements: Extraocular movements intact.     Pupils: Pupils are equal, round, and reactive to light.  Neck:     Thyroid: No thyroid mass, thyromegaly or thyroid tenderness.  Cardiovascular:     Rate and Rhythm: Normal rate and regular rhythm.     Pulses: Normal pulses.     Heart sounds: Normal heart sounds.  Pulmonary:     Effort: Pulmonary effort is normal.     Breath sounds: Normal breath sounds.  Abdominal:     General: Bowel sounds are normal. There is no distension.     Palpations: There is  no mass.     Tenderness: There is no abdominal tenderness.     Hernia: No hernia is present.  Musculoskeletal:        General: No tenderness or signs of injury.     Comments: Patient did have some discomfort when he was doing anterior extension and limited range of motion.  When patient did abduction no pain or limited range of motion.  When patient was performing posterior extension no pain or limited range of motion. "-" Empty can test "-" Hawkins test  Lymphadenopathy:     Cervical: No cervical adenopathy.  Skin:    General: Skin is warm.  Neurological:     General: No focal deficit present.     Mental Status: He is alert.     Comments: Bilateral upper and lower extremity strength 5/5      No results found for any visits on 11/17/22.    The ASCVD Risk score (Arnett DK, et al.,  2019) failed to calculate for the following reasons:   The 2019 ASCVD risk score is only valid for ages 65 to 57    Assessment & Plan:   Problem List Items Addressed This Visit       Endocrine   Goiter    Historical diagnosis of from approximately 10 years ago.  Did review recent CT scan of neck dated from 2021 that stated thyroid was normal        Musculoskeletal and Integument   Ligamentous laxity of right shoulder    Per patient report has had many dislocations of shoulder.  Recently did an x-ray cannot see that in the EMR.  Let patient follow-up with sports medicine in office.        Other   Convulsion ([place])    Patient states he has been seizure-free for approximate 1 and half years.  Maintained on levetiracetam 750 mg daily.  Patient request refill.  Not currently followed by neurology.  Refill provided today      Relevant Medications   levETIRAcetam (KEPPRA) 750 MG tablet   Frequent bowel movements    Patient Dors is frequent bowel movements approximately 8 times a day.  No blood no pain not related to food per his report no diarrhea consistently.  Will check ESR and basic blood work today ambulatory referral to gastro placed      Relevant Orders   CBC   Comprehensive metabolic panel   TSH   Ambulatory referral to Gastroenterology   Sedimentation rate   Other Visit Diagnoses     Encounter to establish care    -  Primary   Need for diphtheria-tetanus-pertussis (Tdap) vaccine       Relevant Orders   Tdap vaccine greater than or equal to 7yo IM (Completed)       Return in about 6 months (around 05/18/2023) for CPE.    Romilda Garret, NP

## 2022-11-17 NOTE — Assessment & Plan Note (Signed)
Per patient report has had many dislocations of shoulder.  Recently did an x-ray cannot see that in the EMR.  Let patient follow-up with sports medicine in office.

## 2022-11-17 NOTE — Assessment & Plan Note (Signed)
Historical diagnosis of from approximately 10 years ago.  Did review recent CT scan of neck dated from 2021 that stated thyroid was normal

## 2022-11-20 NOTE — Progress Notes (Unsigned)
    Terryn Redner T. Cherylene Ferrufino, MD, CAQ Sports Medicine [facility] at [facility] [address] [place], [address]  Phone: [phone]  FAX: [phone]  Ryleigh Buenger - 26 y.o. male  MRN [id number]  Date of Birth: 05-24-1996  Date: 11/21/2022  PCP: Eden Emms, NP  Referral: Eden Emms, NP  No chief complaint on file.  Subjective:   Hero Kulish is a 26 y.o. very pleasant male patient with There is no height or weight on file to calculate BMI. who presents with the following:  Pleasant 26 year old patient who presents with right-sided shoulder pain.  He presents with shoulder laxity, and his right shoulder has reportedly subluxed on multiple occasions.    Review of Systems is noted in the HPI, as appropriate  Objective:   There were no vitals taken for this visit.  GEN: No acute distress; alert,appropriate. PULM: Breathing comfortably in no respiratory distress PSYCH: Normally interactive.   Laboratory and Imaging Data:  Assessment and Plan:   ***

## 2022-11-21 ENCOUNTER — Ambulatory Visit: Payer: Managed Care, Other (non HMO) | Admitting: Family Medicine

## 2022-11-21 ENCOUNTER — Encounter: Payer: Self-pay | Admitting: Family Medicine

## 2022-11-21 VITALS — BP 110/80 | HR 80 | Temp 98.1°F | Ht 67.0 in | Wt 132.0 lb

## 2022-11-21 DIAGNOSIS — M25511 Pain in right shoulder: Secondary | ICD-10-CM

## 2022-11-21 DIAGNOSIS — M25311 Other instability, right shoulder: Secondary | ICD-10-CM | POA: Diagnosis not present

## 2022-12-02 ENCOUNTER — Other Ambulatory Visit: Payer: Self-pay

## 2022-12-02 ENCOUNTER — Ambulatory Visit: Payer: Managed Care, Other (non HMO) | Admitting: Physical Therapy

## 2022-12-02 ENCOUNTER — Encounter: Payer: Self-pay | Admitting: Physical Therapy

## 2022-12-02 DIAGNOSIS — G8929 Other chronic pain: Secondary | ICD-10-CM

## 2022-12-02 DIAGNOSIS — M25511 Pain in right shoulder: Secondary | ICD-10-CM

## 2022-12-02 DIAGNOSIS — M6281 Muscle weakness (generalized): Secondary | ICD-10-CM

## 2022-12-02 DIAGNOSIS — M25611 Stiffness of right shoulder, not elsewhere classified: Secondary | ICD-10-CM

## 2022-12-02 NOTE — Therapy (Signed)
OUTPATIENT PHYSICAL THERAPY SHOULDER EVALUATION   Patient Name: Howard Pena MRN: [id number] DOB:11/15/1996, 26 y.o., male Today's Date: 12/02/2022  END OF SESSION:  PT End of Session - 12/02/22 0917     Visit Number 1    Number of Visits 6    Date for PT Re-Evaluation 01/13/23    Authorization Type [employer] $100 copay    Authorization - Number of Visits 20    PT Start Time 0805    PT Stop Time 0843    PT Time Calculation (min) 38 min    Activity Tolerance Patient tolerated treatment well    Behavior During Therapy WFL for tasks assessed/performed             Past Medical History:  Diagnosis Date   Constitutional growth delay    Goiter    Physical growth delay    Poor appetite    Puberty delay    Seizures (HCC)    Past Surgical History:  Procedure Laterality Date   HAND SURGERY Right    has screw in the right wrist/thumb area   right ear surgery     Patient Active Problem List   Diagnosis Date Noted   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   Frequent bowel movements 11/17/2022   Sleep deprivation seizure (HCC) 04/14/2020   Convulsion (HCC) 03/30/2020   Shifting sleep-work schedule, affecting sleep 02/07/2020   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   Peritonsillar abscess 01/28/2020   Physical growth delay    Constitutional growth delay    Goiter    Poor appetite    Puberty delay    Lack of expected normal physiological development in childhood 04/21/2011   Goiter, unspecified 04/21/2011   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, not elsewhere classified 04/21/2011    PCP: Mordecai Maes, NP  REFERRING PROVIDER: Hannah Beat, MD  REFERRING DIAG:  Diagnosis  M25.511 (ICD-10-CM) - Acute pain of right shoulder  M25.311 (ICD-10-CM) - Shoulder joint instability, right    THERAPY DIAG:  Chronic right shoulder pain - Plan: PT plan of care cert/re-cert  Stiffness of right shoulder, not elsewhere classified - Plan: PT plan of care  cert/re-cert  Muscle weakness (generalized) - Plan: PT plan of care cert/re-cert  Rationale for Evaluation and Treatment: Rehabilitation  ONSET DATE: ~ 1 year ago  SUBJECTIVE:                                                                                                                                                                                      SUBJECTIVE STATEMENT: Pt reports 1 year hx of Rt shoulder subluxations, going to Urgent Care initially.  Most recent injury  was a few weeks ago, and reports 10+ subluxations over the past year.  PERTINENT HISTORY: Hx seizures, growth delay  PAIN:  Are you having pain? Yes: NPRS scale: 2-3, up to 8, at best 2-3/10 Pain location: Rt shoulder Pain description: dull ache, sharp Aggravating factors: repetitive motion, overuse, throwing a football (must throw underhand) Relieving factors: avoiding abduction and ER, rest, has shoulder brace for work  PRECAUTIONS: None  WEIGHT BEARING RESTRICTIONS: No  FALLS:  Has patient fallen in last 6 months? Yes. 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, political party; typically works 8 hour shifts  PLOF: Independent and Leisure: skateboarding; spend time with family  PATIENT GOALS:improve strength, throw a football properly  NEXT MD VISIT: not scheduled  OBJECTIVE:   DIAGNOSTIC FINDINGS:  X rays normal  PATIENT SURVEYS:  12/02/22: FOTO 68 (predicted 79)  COGNITION: Overall cognitive status: Within functional limits for tasks assessed     SENSATION: WFL  POSTURE: 12/02/22: rounded shoulders, forward head  UPPER EXTREMITY ROM:   Active ROM Right eval Left eval  Shoulder flexion 159  (sitting)   Shoulder extension     Shoulder abduction 173 (Standing)   Shoulder adduction    Shoulder internal rotation 90 (In supine, 35 deg abduction)   Shoulder external rotation 72 (In supine, 35 deg abduction)   (Blank rows = not tested)  UPPER EXTREMITY MMT:  MMT Right eval Left eval  Shoulder flexion 27.7, 30.3 (29#)   Shoulder extension    Shoulder abduction 28.1, 28.6 (28.35#) 4/5   Shoulder adduction    Shoulder internal rotation 30.6, 37.9 (34.25#)   Shoulder external rotation 33.9, 34.5 (34.2#) 4/5   (Blank rows = not tested)  SHOULDER SPECIAL TESTS: 12/02/22:Instability tests: Jobes relocation test: negative and Apprehension test: negative   JOINT MOBILITY TESTING:  12/02/22: hypermobility noted Rt shoulder  PALPATION:  12/02/22: tenderness to proximal biceps tendon   TODAY'S TREATMENT:                                                                                                                                         DATE: 12/02/22 See HEP -performed trial reps with mod cues for technique; pt verbalized understanding   PATIENT EDUCATION: Education details: HEP Person educated: Patient Education method: Explanation, Demonstration, and Handouts Education comprehension: verbalized understanding, returned demonstration, and needs further education  HOME EXERCISE PROGRAM: Access Code: [email or web address] URL: [email or web address] Date: 12/02/2022 Prepared by: Moshe Cipro  Exercises - Standing Shoulder Row with Anchored Resistance  - 2 x daily - 7 x weekly - 2-3 sets - 10 reps - 5 sec hold - Shoulder External Rotation Reactive Isometrics  -  2 x daily - 7 x weekly - 2-3 sets - 10 reps - Shoulder Internal Rotation Reactive Isometrics  - 2 x daily - 7 x weekly - 2-3 sets - 10 reps - Standing Shoulder Flexion to 90 Degrees with Dumbbells  - 2 x daily - 7 x weekly - 2-3 sets - 10 reps - Single Arm Scaption with Dumbbell  - 2 x daily - 7 x weekly - 2-3 sets - 10 reps - Plank  with Shoulder Flexion on Counter  - 2 x daily - 7 x weekly - 2-3 sets - 10 reps - 3-5 sec hold  ASSESSMENT:  CLINICAL IMPRESSION: Patient is a 26 y.o. male who was seen today for physical therapy evaluation and treatment for Rt shoulder pain and instability.   He demonstrates mild strength deficits and shoulder instability affecting work and other functional activities.  He will benefit from PT to address deficits listed.   OBJECTIVE IMPAIRMENTS: decreased ROM, decreased strength, impaired UE functional use, postural dysfunction, and pain.   ACTIVITY LIMITATIONS: carrying, lifting, sleeping, reach over head, and caring for others  PARTICIPATION LIMITATIONS: community activity, occupation, and throwing  PERSONAL FACTORS: 1-2 comorbidities: hx seizures  are also affecting patient's functional outcome.   REHAB POTENTIAL: Good  CLINICAL DECISION MAKING: Evolving/moderate complexity  EVALUATION COMPLEXITY: Moderate   GOALS: Goals reviewed with patient? Yes  SHORT TERM GOALS: Target date: 12/23/2022  Independent with initial HEP Goal status: INITIAL  LONG TERM GOALS: Target date: 01/13/2023  Independent with final HEP Goal status: INITIAL  2.  FOTO score improved to 79 Goal status: INITIAL  3.  Rt shoulder strength improved to 5/5 for improved function Goal status: INIITAL  4.  Report pain < 3/10 at the end of work shift for improved function Goal status: INITIAL  5.  Demonstrate ability to throw ball overhead without pain or evidence of instability Goal status: INITIAL   PLAN:  PT FREQUENCY: every other week (will see up to 1x/wk if needed)  PT DURATION: 6 weeks  PLANNED INTERVENTIONS: Therapeutic exercises, Therapeutic activity, Neuromuscular re-education, Patient/Family education, Self Care, Joint mobilization, Aquatic Therapy, Dry Needling, Cryotherapy, Moist heat, Manual therapy, and Re-evaluation  PLAN FOR NEXT SESSION: review HEP, shoulder stabilizing exercises  (quadruped), no estim due to hx of seizures   Clarita Crane, PT, DPT 12/02/22 9:21 AM

## 2022-12-07 ENCOUNTER — Encounter: Payer: Self-pay | Admitting: Physical Therapy

## 2022-12-07 ENCOUNTER — Ambulatory Visit: Payer: Managed Care, Other (non HMO) | Admitting: Physical Therapy

## 2022-12-07 DIAGNOSIS — M25611 Stiffness of right shoulder, not elsewhere classified: Secondary | ICD-10-CM

## 2022-12-07 DIAGNOSIS — M25511 Pain in right shoulder: Secondary | ICD-10-CM

## 2022-12-07 DIAGNOSIS — M6281 Muscle weakness (generalized): Secondary | ICD-10-CM

## 2022-12-07 DIAGNOSIS — G8929 Other chronic pain: Secondary | ICD-10-CM

## 2022-12-07 NOTE — Therapy (Signed)
OUTPATIENT PHYSICAL THERAPY SHOULDER EVALUATION   Patient Name: Howard Pena MRN: [id number] DOB:1996-11-10, 26 y.o., male Today's Date: 12/07/2022  END OF SESSION:  PT End of Session - 12/07/22 0930     Visit Number 2    Number of Visits 6    Date for PT Re-Evaluation 01/13/23    Authorization Type [employer] $100 copay    Authorization - Number of Visits 20    PT Start Time 0930    PT Stop Time 1015    PT Time Calculation (min) 45 min    Activity Tolerance Patient tolerated treatment well    Behavior During Therapy WFL for tasks assessed/performed              Past Medical History:  Diagnosis Date   Constitutional growth delay    Goiter    Physical growth delay    Poor appetite    Puberty delay    Seizures (HCC)    Past Surgical History:  Procedure Laterality Date   HAND SURGERY Right    has screw in the right wrist/thumb area   right ear surgery     Patient Active Problem List   Diagnosis Date Noted   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   Frequent bowel movements 11/17/2022   Sleep deprivation seizure (HCC) 04/14/2020   Convulsion (HCC) 03/30/2020   Shifting sleep-work schedule, affecting sleep 02/07/2020   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   Peritonsillar abscess 01/28/2020   Physical growth delay    Constitutional growth delay    Goiter    Poor appetite    Puberty delay    Lack of expected normal physiological development in childhood 04/21/2011   Goiter, unspecified 04/21/2011   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, not elsewhere classified 04/21/2011    PCP: Mordecai Maes, NP  REFERRING PROVIDER: Hannah Beat, MD  REFERRING DIAG:  Diagnosis  M25.511 (ICD-10-CM) - Acute pain of right shoulder  M25.311 (ICD-10-CM) - Shoulder joint instability, right    THERAPY DIAG:  Chronic right shoulder pain  Stiffness of right shoulder, not elsewhere classified  Muscle weakness (generalized)  Rationale for Evaluation and  Treatment: Rehabilitation  ONSET DATE: ~ 1 year ago  SUBJECTIVE:                                                                                                                                                                                      SUBJECTIVE STATEMENT: Exercises have been going good. Pt notes a little soreness in the R shoulder this morning since he was sleeping on it. Improves after some time. Has been using 2 lb weights at home.   PERTINENT HISTORY:  Hx seizures, growth delay  PAIN:  Are you having pain? Yes: NPRS scale: 2-3/10 Pain location: Rt shoulder Pain description: dull ache, sharp Aggravating factors: repetitive motion, overuse, throwing a football (must throw underhand) Relieving factors: avoiding abduction and ER, rest, has shoulder brace for work  PRECAUTIONS: None  WEIGHT BEARING RESTRICTIONS: No  FALLS:  Has patient fallen in last 6 months? Yes. 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, political party; typically works 8 hour shifts  PLOF: Independent and Leisure: skateboarding; spend time with family  PATIENT GOALS:improve strength, throw a football properly  NEXT MD VISIT: not scheduled  OBJECTIVE:   PATIENT SURVEYS:  12/02/22: FOTO 68 (predicted 79)  POSTURE: 12/02/22: rounded shoulders, forward head  UPPER EXTREMITY ROM:   Active ROM Right eval Left eval  Shoulder flexion 159  (sitting)   Shoulder extension    Shoulder abduction 173 (Standing)   Shoulder adduction    Shoulder internal rotation 90 (In supine, 35 deg abduction)   Shoulder external rotation 72 (In supine, 35 deg abduction)   (Blank rows = not tested)  UPPER EXTREMITY MMT:  MMT Right eval Left eval  Shoulder  flexion 27.7, 30.3 (29#)   Shoulder extension    Shoulder abduction 28.1, 28.6 (28.35#) 4/5   Shoulder adduction    Shoulder internal rotation 30.6, 37.9 (34.25#)   Shoulder external rotation 33.9, 34.5 (34.2#) 4/5   (Blank rows = not tested)  SHOULDER SPECIAL TESTS: 12/02/22:Instability tests: Jobes relocation test: negative and Apprehension test: negative   JOINT MOBILITY TESTING:  12/02/22: hypermobility noted Rt shoulder  PALPATION:  12/02/22: tenderness to proximal biceps tendon    [facility] Adult PT Treatment:                                                DATE: 12/07/22 Therapeutic Exercise: UBE 2 min fwd, 2 min bwd Row green TB 1x10x5", blue TB 1x10x5" Shoulder ER reactive iso 0 deg abd green TB 2x10 Shoulder IR reactive iso 0 deg abd green TB 2x10 Shoulder ER reactive iso 45 deg abd green TB 2x10x5" Shoulder ER reactive iso 90 deg abd green TB x10x5" Counter plank with scapular clock red TB x10 Shoulder flexion 2# to 90 deg elevation 1x10x5" Shoulder scaption 2# 1x10x5"                                                                                                                                            DATE: 12/02/22 See HEP -performed trial reps  with mod cues for technique; pt verbalized understanding   PATIENT EDUCATION: Education details: HEP Person educated: Patient Education method: Explanation, Demonstration, and Handouts Education comprehension: verbalized understanding, returned demonstration, and needs further education  HOME EXERCISE PROGRAM: Access Code: [email or web address] URL: [email or web address] Date: 12/02/2022 Prepared by: Moshe Cipro  Exercises - Standing Shoulder Row with Anchored Resistance  - 2 x daily - 7 x weekly - 2-3 sets - 10 reps - 5 sec hold - Shoulder External Rotation Reactive Isometrics  - 2 x daily - 7 x weekly - 2-3 sets - 10 reps - Shoulder Internal Rotation Reactive Isometrics  - 2 x daily - 7 x weekly -  2-3 sets - 10 reps - Standing Shoulder Flexion to 90 Degrees with Dumbbells  - 2 x daily - 7 x weekly - 2-3 sets - 10 reps - Single Arm Scaption with Dumbbell  - 2 x daily - 7 x weekly - 2-3 sets - 10 reps - Plank with Shoulder Flexion on Counter  - 2 x daily - 7 x weekly - 2-3 sets - 10 reps - 3-5 sec hold  ASSESSMENT:  CLINICAL IMPRESSION: Reviewed HEP -- pt required cueing for correct positioning for shoulder ER/IR reactive iso. Progressed shoulder stabilization and strengthening exercises as tolerated. Initiated scapular stabilization.   OBJECTIVE IMPAIRMENTS: decreased ROM, decreased strength, impaired UE functional use, postural dysfunction, and pain.     GOALS: Goals reviewed with patient? Yes  SHORT TERM GOALS: Target date: 12/23/2022  Independent with initial HEP Goal status: INITIAL  LONG TERM GOALS: Target date: 01/13/2023  Independent with final HEP Goal status: INITIAL  2.  FOTO score improved to 79 Goal status: INITIAL  3.  Rt shoulder strength improved to 5/5 for improved function Goal status: INIITAL  4.  Report pain < 3/10 at the end of work shift for improved function Goal status: INITIAL  5.  Demonstrate ability to throw ball overhead without pain or evidence of instability Goal status: INITIAL   PLAN:  PT FREQUENCY: every other week (will see up to 1x/wk if needed)  PT DURATION: 6 weeks  PLANNED INTERVENTIONS: Therapeutic exercises, Therapeutic activity, Neuromuscular re-education, Patient/Family education, Self Care, Joint mobilization, Aquatic Therapy, Dry Needling, Cryotherapy, Moist heat, Manual therapy, and Re-evaluation  PLAN FOR NEXT SESSION: review HEP, shoulder stabilizing exercises (quadruped), no estim due to hx of seizures   Gearlene Godsil April Ma L Kennita Pavlovich, PT 12/07/22 9:31 AM

## 2022-12-22 ENCOUNTER — Encounter: Payer: Self-pay | Admitting: Physical Therapy

## 2022-12-22 ENCOUNTER — Ambulatory Visit: Payer: Managed Care, Other (non HMO) | Admitting: Physical Therapy

## 2022-12-22 DIAGNOSIS — M6281 Muscle weakness (generalized): Secondary | ICD-10-CM

## 2022-12-22 DIAGNOSIS — M25611 Stiffness of right shoulder, not elsewhere classified: Secondary | ICD-10-CM

## 2022-12-22 DIAGNOSIS — M25511 Pain in right shoulder: Secondary | ICD-10-CM | POA: Diagnosis not present

## 2022-12-22 DIAGNOSIS — G8929 Other chronic pain: Secondary | ICD-10-CM

## 2022-12-22 NOTE — Therapy (Signed)
OUTPATIENT PHYSICAL THERAPY TREATMENT NOTE   Patient Name: Howard Pena MRN: [id number] DOB:[birth year], 27 y.o., male Today's Date: 12/22/2022  END OF SESSION:  PT End of Session - 12/22/22 1516     Visit Number 3    Number of Visits 6    Date for PT Re-Evaluation 01/13/23    Authorization Type [employer] $100 copay    Authorization - Number of Visits 20    PT Start Time 7001    PT Stop Time 1558    PT Time Calculation (min) 42 min    Activity Tolerance Patient tolerated treatment well    Behavior During Therapy WFL for tasks assessed/performed              Past Medical History:  Diagnosis Date   Constitutional growth delay    Goiter    Physical growth delay    Poor appetite    Puberty delay    Seizures ([place])    Past Surgical History:  Procedure Laterality Date   HAND SURGERY Right    has screw in the right wrist/thumb area   right ear surgery     Patient Active Problem List   Diagnosis Date Noted   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   Frequent bowel movements 11/17/2022   Sleep deprivation seizure ([place]) 04/14/2020   Convulsion ([place]) 03/30/2020   Shifting sleep-work schedule, affecting sleep 02/07/2020   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[place]) 02/07/2020   Peritonsillar abscess 01/28/2020   Physical growth delay    Constitutional growth delay    Goiter    Poor appetite    Puberty delay    Lack of expected normal physiological development in childhood 04/21/2011   Goiter, unspecified 04/21/2011   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, not elsewhere classified 04/21/2011    PCP: Karl Ito, NP  REFERRING PROVIDER: Owens Loffler, MD  REFERRING DIAG:  Diagnosis  M25.511 (ICD-10-CM) - Acute pain of right shoulder  M25.311 (ICD-10-CM) - Shoulder joint instability, right    THERAPY DIAG:  Chronic right shoulder pain  Muscle weakness (generalized)  Stiffness of right shoulder, not elsewhere classified  Rationale for Evaluation and Treatment:  Rehabilitation  ONSET DATE: ~ 1 year ago  SUBJECTIVE:                                                                                                                                                                                      SUBJECTIVE STATEMENT:  He has been doing his exercises from memory because 3yo son tore up his HEP.   PERTINENT HISTORY: Hx seizures, growth delay  PAIN:  Are you having pain? Yes: NPRS scale: 5/10 today & in  last week 1/10 - 6/10 Pain location: Rt shoulder Pain description: dull ache, sharp Aggravating factors: repetitive motion, overuse, throwing a football (must throw underhand) Relieving factors: avoiding abduction and ER, rest, has shoulder brace for work  PRECAUTIONS: None  WEIGHT BEARING RESTRICTIONS: No  FALLS:  Has patient fallen in last 6 months? Yes. 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; typically works 8 hour shifts  PLOF: Independent and Leisure: skateboarding; spend time with family  PATIENT GOALS:improve strength, throw a football properly  NEXT MD VISIT: not scheduled  OBJECTIVE:   PATIENT SURVEYS:  12/02/22: FOTO 68 (predicted 79)  POSTURE: 12/02/22: rounded shoulders, forward head  UPPER EXTREMITY ROM:   Active ROM Right eval Left eval  Shoulder flexion 159  (sitting)   Shoulder extension    Shoulder abduction 173 (Standing)   Shoulder adduction    Shoulder internal rotation 90 (In supine, 35 deg abduction)   Shoulder external rotation 72 (In supine, 35 deg abduction)   (Blank rows = not tested)  UPPER EXTREMITY MMT:  MMT Right eval Left eval  Shoulder flexion 27.7, 30.3 (29#)   Shoulder extension    Shoulder abduction 28.1,  28.6 (28.35#) 4/5   Shoulder adduction    Shoulder internal rotation 30.6, 37.9 (34.25#)   Shoulder external rotation 33.9, 34.5 (34.2#) 4/5   (Blank rows = not tested)  SHOULDER SPECIAL TESTS: 12/02/22:Instability tests: Jobes relocation test: negative and Apprehension test: negative   JOINT MOBILITY TESTING:  12/02/22: hypermobility noted Rt shoulder  PALPATION:  12/02/22: tenderness to proximal biceps tendon    [facility] Adult PT Treatment:                                                DATE:  12/22/2022: Therapeutic Exercise: Reviewed HEP with PT cueing head & shoulder/scapula posture with ea exercise.  Row blue TB 1x10x5" Shoulder ER reactive iso 0 deg abd red TB 10 reps (towel roll under arm) Shoulder IR reactive iso 0 deg abd red TB 10 reps (towel roll under arm) Shoulder ER reactive iso 90 deg abd red TB 10 reps Shoulder flexion to 90* 2# dumbbell BUEs 5 sec hold 10 reps Shoulder scaption to 90* 2# dumbbell BUEs 5 sec hold 10 reps Wall plank with scapular clock red TB RUE X 5 reps Quadruped cat/cow for scapula control 10 reps Quadruped arm lift following hand with eyes alternating UEs 10 reps Quadruped arm/leg lift Bird dog 10 reps  Self-care:  PT demo & verbal cues on positioning with pillows in sidelying as pt reports his shoulders hurt upon awakening. Pt verbalized understanding.   12/07/22 Therapeutic Exercise: UBE 2 min fwd, 2 min bwd Row green TB 1x10x5", blue TB 1x10x5" Shoulder ER reactive iso 0 deg abd green TB 2x10 Shoulder IR reactive iso 0 deg abd green TB 2x10 Shoulder ER reactive iso 45 deg abd green TB 2x10x5" Shoulder ER reactive iso 90 deg abd green TB x10x5" Counter plank with scapular clock red TB x10 Shoulder flexion 2# to 90 deg elevation 1x10x5" Shoulder scaption 2# 1x10x5"  DATE: 12/02/22 See HEP  -performed trial reps with mod cues for technique; pt verbalized understanding   PATIENT EDUCATION: Education details: HEP Person educated: Patient Education method: Explanation, Demonstration, and Handouts Education comprehension: verbalized understanding, returned demonstration, and needs further education  HOME EXERCISE PROGRAM: Access Code: [email or web address] URL: [email or web address] Date: 12/22/2022 Prepared by: Jamey Reas  Exercises - Standing Shoulder Row with Anchored Resistance  - 2 x daily - 7 x weekly - 2-3 sets - 10 reps - 5 sec hold - Shoulder External Rotation Reactive Isometrics  - 2 x daily - 7 x weekly - 2-3 sets - 10 reps - Shoulder Internal Rotation Reactive Isometrics  - 2 x daily - 7 x weekly - 2-3 sets - 10 reps - Standing Shoulder Flexion to 90 Degrees with Dumbbells  - 2 x daily - 7 x weekly - 2-3 sets - 10 reps - Single Arm Scaption with Dumbbell  - 2 x daily - 7 x weekly - 2-3 sets - 10 reps - Isometric Standing Shoulder External Rotation in Abduction  - 1 x daily - 7 x weekly - 2-3 sets - 10 reps - 3-5 sec hold - Wall Clock with Theraband  - 1 x daily - 7 x weekly - 3 sets - 10 reps - Cat Cow  - 1 x daily - 7 x weekly - 2-3 sets - 10 reps - 5 seconds hold - Quadruped Alternating Shoulder Flexion  - 1 x daily - 7 x weekly - 2-3 sets - 10 reps - 5 seconds hold - Bird Dog  - 1 x daily - 7 x weekly - 2-3 sets - 10 reps - 5 seconds hold  ASSESSMENT:  CLINICAL IMPRESSION: PT instructed pt in positioning for sleep in sidelying which he seems to understand.  PT reviewed HEP with emphasis on posture & control and added quadruped exercises.  He appears to have a better understanding of exercises.  Pt had difficulty maintaining positions with green theraband so backed down to red.    OBJECTIVE IMPAIRMENTS: decreased ROM, decreased strength, impaired UE functional use, postural dysfunction, and pain.     GOALS: Goals reviewed with patient? Yes  SHORT  TERM GOALS: Target date: 12/23/2022  Independent with initial HEP Goal status: MET 12/22/2022  LONG TERM GOALS: Target date: 01/13/2023  Independent with final HEP Goal status: INITIAL  2.  FOTO score improved to 79 Goal status: INITIAL  3.  Rt shoulder strength improved to 5/5 for improved function Goal status: INIITAL  4.  Report pain < 3/10 at the end of work shift for improved function Goal status: INITIAL  5.  Demonstrate ability to throw ball overhead without pain or evidence of instability Goal status: INITIAL   PLAN:  PT FREQUENCY: every other week (will see up to 1x/wk if needed)  PT DURATION: 6 weeks  PLANNED INTERVENTIONS: Therapeutic exercises, Therapeutic activity, Neuromuscular re-education, Patient/Family education, Self Care, Joint mobilization, Aquatic Therapy, Dry Needling, Cryotherapy, Moist heat, Manual therapy, and Re-evaluation  PLAN FOR NEXT SESSION: continue with shoulder stabilizing exercises (quadruped), progress HEP as indicated, no estim due to hx of seizures   Jamey Reas, PT, DPT 12/22/2022, 4:15 PM

## 2022-12-27 NOTE — Therapy (Signed)
OUTPATIENT PHYSICAL THERAPY TREATMENT NOTE   Patient Name: Howard Pena MRN: [id number] DOB:[birth year], 27 y.o., male Today's Date: 12/27/2022  END OF SESSION:     Past Medical History:  Diagnosis Date   Constitutional growth delay    Goiter    Physical growth delay    Poor appetite    Puberty delay    Seizures ([place])    Past Surgical History:  Procedure Laterality Date   HAND SURGERY Right    has screw in the right wrist/thumb area   right ear surgery     Patient Active Problem List   Diagnosis Date Noted   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   Frequent bowel movements 11/17/2022   Sleep deprivation seizure ([place]) 04/14/2020   Convulsion ([place]) 03/30/2020   Shifting sleep-work schedule, affecting sleep 02/07/2020   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  Rationale for Evaluation and Treatment: Rehabilitation  ONSET DATE: ~ 1 year ago  SUBJECTIVE:                                                                                                                                                                                      SUBJECTIVE STATEMENT:  *** He has been doing his exercises from memory because 3yo son tore up his HEP.   PERTINENT HISTORY: Hx seizures, growth delay  PAIN:  Are you having pain? Yes: NPRS scale: 5/10 today & in last week 1/10 - 6/10 Pain location: Rt  shoulder Pain description: dull ache, sharp Aggravating factors: repetitive motion, overuse, throwing a football (must throw underhand) Relieving factors: avoiding abduction and ER, rest, has shoulder brace for work  PRECAUTIONS: None  WEIGHT BEARING RESTRICTIONS: No  FALLS:  Has patient fallen in last 6 months? Yes. 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 [employer]; typically works  8 hour shifts  PLOF: Independent and Leisure: skateboarding; spend time with family  PATIENT GOALS:improve strength, throw a football properly  NEXT MD VISIT: not scheduled  OBJECTIVE:   PATIENT SURVEYS:  12/02/22: FOTO 68 (predicted 79)  POSTURE: 12/02/22: rounded shoulders, forward head  UPPER EXTREMITY ROM:   Active ROM Right eval Left eval  Shoulder flexion 159  (sitting)   Shoulder extension    Shoulder abduction 173 (Standing)   Shoulder adduction    Shoulder internal rotation 90 (In supine, 35 deg abduction)   Shoulder external rotation 72 (In supine, 35 deg abduction)   (Blank rows = not tested)  UPPER EXTREMITY MMT:  MMT Right eval Left eval  Shoulder flexion 27.7, 30.3 (29#)   Shoulder extension    Shoulder abduction 28.1, 28.6 (28.35#) 4/5   Shoulder adduction    Shoulder internal rotation 30.6, 37.9 (34.25#)   Shoulder external rotation 33.9, 34.5 (34.2#) 4/5   (Blank rows = not tested)  SHOULDER SPECIAL TESTS: 12/02/22:Instability tests: Jobes relocation test: negative and Apprehension test: negative   JOINT MOBILITY TESTING:  12/02/22: hypermobility noted Rt shoulder  PALPATION:  12/02/22: tenderness to proximal biceps tendon    [facility] Adult PT Treatment:                                                 DATE:  12/27/22 ***  12/22/2022: Therapeutic Exercise: Reviewed HEP with PT cueing head & shoulder/scapula posture with ea exercise.  Row blue TB 1x10x5" Shoulder ER reactive iso 0 deg abd red TB 10 reps (towel roll under arm) Shoulder IR reactive iso 0 deg abd red TB 10 reps (towel roll under arm) Shoulder ER reactive iso 90 deg abd red TB 10 reps Shoulder flexion to 90* 2# dumbbell BUEs 5 sec hold 10 reps Shoulder scaption to 90* 2# dumbbell BUEs 5 sec hold 10 reps Wall plank with scapular clock red TB RUE X 5 reps Quadruped cat/cow for scapula control 10 reps Quadruped arm lift following hand with eyes alternating UEs 10 reps Quadruped arm/leg lift Bird dog 10 reps  Self-care:  PT demo & verbal cues on positioning with pillows in sidelying as pt reports his shoulders hurt upon awakening. Pt verbalized understanding.   12/07/22 Therapeutic Exercise: UBE 2 min fwd, 2 min bwd Row green TB 1x10x5", blue TB 1x10x5" Shoulder ER reactive iso 0 deg abd green TB 2x10 Shoulder IR reactive iso 0 deg abd green TB 2x10 Shoulder ER reactive iso 45 deg abd green TB 2x10x5" Shoulder ER reactive iso 90 deg abd green TB x10x5" Counter plank with scapular clock red TB x10 Shoulder flexion 2# to 90 deg elevation 1x10x5" Shoulder scaption 2# 1x10x5"  DATE: 12/02/22 See HEP -performed trial reps with mod cues for technique; pt verbalized understanding   PATIENT EDUCATION: Education details: HEP Person educated: Patient Education method: Explanation, Demonstration, and Handouts Education comprehension: verbalized understanding, returned demonstration, and needs further education  HOME EXERCISE PROGRAM: Access Code: [email or web address] URL: [email or web address] Date: 12/22/2022 Prepared by: Vladimir Faster  Exercises - Standing Shoulder Row with  Anchored Resistance  - 2 x daily - 7 x weekly - 2-3 sets - 10 reps - 5 sec hold - Shoulder External Rotation Reactive Isometrics  - 2 x daily - 7 x weekly - 2-3 sets - 10 reps - Shoulder Internal Rotation Reactive Isometrics  - 2 x daily - 7 x weekly - 2-3 sets - 10 reps - Standing Shoulder Flexion to 90 Degrees with Dumbbells  - 2 x daily - 7 x weekly - 2-3 sets - 10 reps - Single Arm Scaption with Dumbbell  - 2 x daily - 7 x weekly - 2-3 sets - 10 reps - Isometric Standing Shoulder External Rotation in Abduction  - 1 x daily - 7 x weekly - 2-3 sets - 10 reps - 3-5 sec hold - Wall Clock with Theraband  - 1 x daily - 7 x weekly - 3 sets - 10 reps - Cat Cow  - 1 x daily - 7 x weekly - 2-3 sets - 10 reps - 5 seconds hold - Quadruped Alternating Shoulder Flexion  - 1 x daily - 7 x weekly - 2-3 sets - 10 reps - 5 seconds hold - Bird Dog  - 1 x daily - 7 x weekly - 2-3 sets - 10 reps - 5 seconds hold  ASSESSMENT:  CLINICAL IMPRESSION: PT instructed pt in positioning for sleep in sidelying which he seems to understand.  PT reviewed HEP with emphasis on posture & control and added quadruped exercises.  He appears to have a better understanding of exercises.  Pt had difficulty maintaining positions with green theraband so backed down to red.    OBJECTIVE IMPAIRMENTS: decreased ROM, decreased strength, impaired UE functional use, postural dysfunction, and pain.     GOALS: Goals reviewed with patient? Yes  SHORT TERM GOALS: Target date: 12/23/2022  Independent with initial HEP Goal status: MET 12/22/2022  LONG TERM GOALS: Target date: 01/13/2023  Independent with final HEP Goal status: INITIAL  2.  FOTO score improved to 79 Goal status: INITIAL  3.  Rt shoulder strength improved to 5/5 for improved function Goal status: INIITAL  4.  Report pain < 3/10 at the end of work shift for improved function Goal status: INITIAL  5.  Demonstrate ability to throw ball overhead without pain or evidence  of instability Goal status: INITIAL   PLAN:  PT FREQUENCY: every other week (will see up to 1x/wk if needed)  PT DURATION: 6 weeks  PLANNED INTERVENTIONS: Therapeutic exercises, Therapeutic activity, Neuromuscular re-education, Patient/Family education, Self Care, Joint mobilization, Aquatic Therapy, Dry Needling, Cryotherapy, Moist heat, Manual therapy, and Re-evaluation  PLAN FOR NEXT SESSION: *** continue with shoulder stabilizing exercises (quadruped), progress HEP as indicated, no estim due to hx of seizures   Moshe Cipro, PT, DPT 12/27/2022, 2:19 PM

## 2022-12-28 ENCOUNTER — Encounter: Payer: Self-pay | Admitting: Physical Therapy

## 2022-12-28 ENCOUNTER — Ambulatory Visit: Payer: Managed Care, Other (non HMO) | Admitting: Physical Therapy

## 2022-12-28 DIAGNOSIS — M25611 Stiffness of right shoulder, not elsewhere classified: Secondary | ICD-10-CM

## 2022-12-28 DIAGNOSIS — G8929 Other chronic pain: Secondary | ICD-10-CM | POA: Diagnosis not present

## 2022-12-28 DIAGNOSIS — M6281 Muscle weakness (generalized): Secondary | ICD-10-CM | POA: Diagnosis not present

## 2022-12-28 DIAGNOSIS — M25511 Pain in right shoulder: Secondary | ICD-10-CM | POA: Diagnosis not present

## 2023-01-04 ENCOUNTER — Telehealth: Payer: Self-pay | Admitting: *Deleted

## 2023-01-04 ENCOUNTER — Ambulatory Visit: Payer: Managed Care, Other (non HMO) | Admitting: Physical Therapy

## 2023-01-04 ENCOUNTER — Encounter: Payer: Self-pay | Admitting: Physical Therapy

## 2023-01-04 DIAGNOSIS — G8929 Other chronic pain: Secondary | ICD-10-CM

## 2023-01-04 DIAGNOSIS — M6281 Muscle weakness (generalized): Secondary | ICD-10-CM

## 2023-01-04 DIAGNOSIS — M25511 Pain in right shoulder: Secondary | ICD-10-CM | POA: Diagnosis not present

## 2023-01-04 DIAGNOSIS — M25611 Stiffness of right shoulder, not elsewhere classified: Secondary | ICD-10-CM

## 2023-01-04 NOTE — Telephone Encounter (Signed)
Spoke with [employer].  He already has a follow up appointment scheduled for 01/09/23 but they scheduled him with [facility].  I rescheduled him to Dr. Lillie Fragmin schedule for 10:40 am on 01/09/2023.

## 2023-01-04 NOTE — Telephone Encounter (Signed)
-----  Message from Owens Loffler, MD sent at 01/04/2023 11:48 AM EST -----    ----- Message ----- From: Laureen Abrahams, PT Sent: 01/04/2023  10:44 AM EST To: Owens Loffler, MD  Continues to sublux, reporting it happened 3 times yesterday.  Strength is about the same as initial eval.  Feel at this time further imaging is likely warranted (U/S, MRI?) and possible referral to orthopedic surgery.  I also told him to reach out to you about my concerns as well. Thank you for the referral!  Laureen Abrahams, PT, DPT 01/04/23 10:43 AM

## 2023-01-04 NOTE — Progress Notes (Signed)
Butch Penny, PT told me that his shoulder is not doing well.  Can we get him to come back into the office for more testing?  Thanks!

## 2023-01-04 NOTE — Therapy (Signed)
OUTPATIENT PHYSICAL THERAPY TREATMENT NOTE DISCHARGE SUMMARY   Patient Name: Howard Pena MRN: [id number] DOB:July 06, 1996, 27 y.o., male Today's Date: 01/04/2023  END OF SESSION:  PT End of Session - 01/04/23 1007     Visit Number 5    Number of Visits 6    Date for PT Re-Evaluation 01/13/23    Authorization Type [employer] [id number] copay    Authorization - Number of Visits 20    PT Start Time 1010    PT Stop Time 1035    PT Time Calculation (min) 25 min    Activity Tolerance Patient tolerated treatment well    Behavior During Therapy WFL for tasks assessed/performed                Past Medical History:  Diagnosis Date   Constitutional growth delay    Goiter    Physical growth delay    Poor appetite    Puberty delay    Seizures ([facility])    Past Surgical History:  Procedure Laterality Date   HAND SURGERY Right    has screw in the right wrist/thumb area   right ear surgery     Patient Active Problem List   Diagnosis Date Noted   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   Frequent bowel movements 11/17/2022   Sleep deprivation seizure ([facility]) 04/14/2020   Convulsion ([facility]) 03/30/2020   Shifting sleep-work schedule, affecting sleep 02/07/2020   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   Peritonsillar abscess 01/28/2020   Physical growth delay    Constitutional growth delay    Goiter    Poor appetite    Puberty delay    Lack of expected normal physiological development in childhood 04/21/2011   Goiter, unspecified 04/21/2011   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, not elsewhere classified 04/21/2011    PCP: Mordecai Maes, NP  REFERRING PROVIDER: Hannah Beat, MD  REFERRING DIAG:  Diagnosis  M25.511 (ICD-10-CM) - Acute pain of right shoulder  M25.311 (ICD-10-CM) - Shoulder joint instability, right    THERAPY DIAG:  Chronic right shoulder pain  Muscle weakness (generalized)  Stiffness of right shoulder, not elsewhere classified  Rationale for  Evaluation and Treatment: Rehabilitation  ONSET DATE: ~ 1 year ago  SUBJECTIVE:                                                                                                                                                                                      SUBJECTIVE STATEMENT:  reports his shoulder subluxed anteriorly 3 times yesterday at work and shoulder is really sore today  PERTINENT HISTORY: Hx seizures, growth delay  PAIN:  Are you having pain? Yes: NPRS scale:  5/10 today & in last week 1/10 - 6/10 Pain location: Rt shoulder Pain description: sore Aggravating factors: repetitive motion, overuse, throwing a football (must throw underhand) Relieving factors: avoiding abduction and ER, rest, has shoulder brace for work  PRECAUTIONS: None  WEIGHT BEARING RESTRICTIONS: No  FALLS:  Has patient fallen in last 6 months? Yes. 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; typically works 8 hour shifts  PLOF: Independent and Leisure: skateboarding; spend time with family  PATIENT GOALS:improve strength, throw a football properly  NEXT MD VISIT: not scheduled  OBJECTIVE:   PATIENT SURVEYS:  12/02/22: FOTO 68 (predicted 79) 12/28/22: FOTO 71 01/04/23: FOTO   POSTURE: 12/02/22: rounded shoulders, forward head  UPPER EXTREMITY ROM:   Active ROM Right eval Left eval  Shoulder flexion 159  (sitting)   Shoulder extension    Shoulder abduction 173 (Standing)   Shoulder adduction    Shoulder internal rotation 90 (In supine, 35 deg abduction)   Shoulder external rotation 72 (In supine, 35 deg abduction)   (Blank rows = not tested)  UPPER EXTREMITY MMT:  MMT Right eval Right 12/28/22 Right 01/04/23   Shoulder flexion 27.7, 30.3 (29#) 23# 27.1, 29.5 (28.3#)  Shoulder extension     Shoulder abduction 28.1, 28.6 (28.35#) 4/5 18.4, 17.5 (17.95#) 28.5, 29.6 (29.05#)  Shoulder adduction     Shoulder internal rotation 30.6, 37.9 (34.25#) 30.3, 32.9 (31.6#) 29.1, 29.9 (29.5#)  Shoulder external rotation 33.9, 34.5 (34.2#) 4/5 27.3, 35.5 (31.4#) 33, 32 (32.5#)  (Blank rows = not tested)  SHOULDER SPECIAL TESTS: 12/02/22:Instability tests: Jobes relocation test: negative and Apprehension test: negative   JOINT MOBILITY TESTING:  12/02/22: hypermobility noted Rt shoulder  PALPATION:  12/02/22: tenderness to proximal biceps tendon    [facility] Adult PT Treatment: 01/04/23 Attempted to perform overhead throw - unable to achieve full external rotation due to concern for subluxation MMT measurements as noted above  Discussed current (lack of) progress and repeated self reports of Rt shoulder subluxations (sometimes multiple per day); recommend pt follow back up with referring provider to discuss next steps. Pt in agreement at this time.   12/27/22 TherEx UBE L3 x 8 min (alt 2' each direction) MMT - see above for details Quadruped with alternating shoulder flexion and 2# dumbbells x 10 reps bil Quadruped horizontal abduction with trunk rotation x 10 reps bil and 2# dumbbells Rows L4 band 2x10 reps   12/22/2022: Therapeutic Exercise: Reviewed HEP with PT cueing head & shoulder/scapula posture with ea exercise.  Row blue TB 1x10x5" Shoulder ER reactive iso 0 deg abd red TB 10 reps (towel roll under arm) Shoulder IR reactive iso 0 deg abd red TB 10 reps (towel roll under arm) Shoulder ER reactive iso 90 deg abd red TB 10 reps Shoulder flexion to 90* 2# dumbbell BUEs 5 sec hold 10 reps Shoulder scaption to 90* 2# dumbbell BUEs 5 sec hold 10 reps Wall plank with scapular clock red TB RUE X 5 reps Quadruped cat/cow for scapula control 10 reps Quadruped arm lift following hand with  eyes alternating UEs 10 reps Quadruped arm/leg lift Bird dog 10 reps  Self-care:  PT demo & verbal cues on positioning with pillows in sidelying as pt reports his shoulders  hurt upon awakening. Pt verbalized understanding.   12/07/22 Therapeutic Exercise: UBE 2 min fwd, 2 min bwd Row green TB 1x10x5", blue TB 1x10x5" Shoulder ER reactive iso 0 deg abd green TB 2x10 Shoulder IR reactive iso 0 deg abd green TB 2x10 Shoulder ER reactive iso 45 deg abd green TB 2x10x5" Shoulder ER reactive iso 90 deg abd green TB x10x5" Counter plank with scapular clock red TB x10 Shoulder flexion 2# to 90 deg elevation 1x10x5" Shoulder scaption 2# 1x10x5"                                                                                                                                            DATE: 12/02/22 See HEP -performed trial reps with mod cues for technique; pt verbalized understanding   PATIENT EDUCATION: Education details: HEP Person educated: Patient Education method: Explanation, Demonstration, and Handouts Education comprehension: verbalized understanding, returned demonstration, and needs further education  HOME EXERCISE PROGRAM: Access Code: [email or web address] URL: [email or web address] Date: 12/22/2022 Prepared by: Vladimir Faster  Exercises - Standing Shoulder Row with Anchored Resistance  - 2 x daily - 7 x weekly - 2-3 sets - 10 reps - 5 sec hold - Shoulder External Rotation Reactive Isometrics  - 2 x daily - 7 x weekly - 2-3 sets - 10 reps - Shoulder Internal Rotation Reactive Isometrics  - 2 x daily - 7 x weekly - 2-3 sets - 10 reps - Standing Shoulder Flexion to 90 Degrees with Dumbbells  - 2 x daily - 7 x weekly - 2-3 sets - 10 reps - Single Arm Scaption with Dumbbell  - 2 x daily - 7 x weekly - 2-3 sets - 10 reps - Isometric Standing Shoulder External Rotation in Abduction  - 1 x daily - 7 x weekly - 2-3 sets - 10 reps - 3-5 sec hold - Wall Clock with Theraband  - 1 x  daily - 7 x weekly - 3 sets - 10 reps - Cat Cow  - 1 x daily - 7 x weekly - 2-3 sets - 10 reps - 5 seconds hold - Quadruped Alternating Shoulder Flexion  - 1 x daily - 7 x weekly - 2-3 sets - 10 reps - 5 seconds hold - Bird Dog  - 1 x daily - 7 x weekly - 2-3 sets - 10 reps - 5 seconds hold  ASSESSMENT:  CLINICAL IMPRESSION: Pt with continued episodes or Rt shoulder subluxations and reporting it happened 3 times yesterday.  At this time recommend he return to MD to discuss next steps.  One LTG met to date.  Will d/c PT at this time, anticipate return if surgery indicated.  OBJECTIVE IMPAIRMENTS: decreased ROM, decreased strength, impaired UE functional use, postural dysfunction, and pain.   GOALS: Goals reviewed with patient? Yes  SHORT TERM GOALS: Target date: 12/23/2022  Independent with initial HEP Goal status: MET  12/22/2022  LONG TERM GOALS: Target date: 01/13/2023  Independent with final HEP Goal status: MET 01/04/23  2.  FOTO score improved to 79 Goal status: NOT MET 01/04/23  3.  Rt shoulder strength improved to 5/5 for improved function Goal status: NOT MET 01/04/23  4.  Report pain < 3/10 at the end of work shift for improved function Goal status: NOT MET 01/04/23  5.  Demonstrate ability to throw ball overhead without pain or evidence of instability Goal status: NOT MET 01/04/23   PLAN:  PT FREQUENCY: every other week (will see up to 1x/wk if needed)  PT DURATION: 6 weeks  PLANNED INTERVENTIONS: Therapeutic exercises, Therapeutic activity, Neuromuscular re-education, Patient/Family education, Self Care, Joint mobilization, Aquatic Therapy, Dry Needling, Cryotherapy, Moist heat, Manual therapy, and Re-evaluation  PLAN FOR NEXT SESSION: d/c PT, return to MD for further evaluation.   Faustino Congress, PT, DPT 01/04/2023, 10:43 AM    PHYSICAL THERAPY DISCHARGE SUMMARY  Visits from Start of Care: 5  Current functional level related to goals / functional  outcomes: See above   Remaining deficits: See above   Education / Equipment: HEP   Patient agrees to discharge. Patient goals were not met. Patient is being discharged due to did not respond to therapy.   Laureen Abrahams, PT, DPT 01/04/23 10:43 AM  [facility] [address] [place], [place], [address] Phone: [phone]   Fax:  [phone]

## 2023-01-04 NOTE — Progress Notes (Signed)
Thanks for the feedback.  I will plan on bringing him back in the office, and will do further imaging.

## 2023-01-08 NOTE — Progress Notes (Signed)
Howard Sole T. Wynee Matarazzo, MD, [place] at [facility] [place], [address]  Phone: [phone]  FAX: [phone]  Howard Pena - 27 y.o. male  MRN [id number]  Date of Birth: Dec 25, 1995  Date: 01/09/2023  PCP: Howard Pitcher, NP  Referral: Howard Pitcher, NP  Chief Complaint  Patient presents with  . Shoulder Pain    Right   Subjective:   Howard Pena is a 27 y.o. very pleasant male patient with Body mass index is 20.38 kg/m. who presents with the following:  Patient presents with chronic shoulder instability.  I saw him on November 21, 2022 for repetitive subluxation.  Intervally, he has been going to physical therapy to work on rotator cuff strengthening and scapular stabilization.  He also has had multiple additional subluxation events and has some severe shoulder pain.  He presents today in follow-up for his shoulder instability.  He continues to be plagued with chronic instability of the right shoulder.  Indeed, he recently had a full dislocation within the last few weeks, and he was able to self relocate this.  Right now, he still is having a lot of pain with basic movement, and he is having weakness.  He is particularly having problems doing work-related tasks, lifting and positioning packages.  PT notes - still with a lot of laxity.  It felt as if he was having no significant decrease in instability  Review of Systems is noted in the HPI, as appropriate  Patient Active Problem List   Diagnosis Date Noted  . 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  . Frequent bowel movements 11/17/2022  . Sleep deprivation seizure ([place]) 04/14/2020  . 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 Shifting sleep-work schedule, affecting sleep 02/07/2020  . 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 Peritonsillar abscess 01/28/2020  . Physical growth delay   . Constitutional growth delay   . Goiter   . Poor appetite   . Puberty  delay   . Lack of expected normal physiological development in childhood 04/21/2011  . Goiter, unspecified 04/21/2011  . 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 Constitutional growth delay   . Goiter   . Physical growth delay   . Poor appetite   . Puberty delay   . Seizures ([place])     Past Surgical History:  Procedure Laterality Date  . HAND SURGERY Right    has screw in the right wrist/thumb area  . right ear surgery      Family History  Problem Relation Age of Onset  . Thyroid disease Mother   . Post-traumatic stress disorder Father   . Stomach cancer Other   . 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 PULM: Breathing comfortably in no respiratory distress PSYCH: Normally interactive.    Shoulder: R Inspection: No muscle  wasting, but there is some mild winging Ecchymosis/edema: neg  AC joint, scapula, clavicle: AC joint is tender to palpation Cervical spine: NT, full ROM Abduction: Painful arc of motion, and he is unable to achieve a full active abduction, 4/5 Flexion: Painful arc of motion, and he is not able to achieve full flexion, 4/5 IR, full, lift-off: 4/5 ER at neutral: full, 4/5 AC crossover and compression: Positive Neer: Positive Hawkins: Positive Drop Test: Negative Empty Can: Positive Supraspinatus insertion: NT Bicipital groove: NT Sulcus sign: Notably increased laxity Apprehension: Positive O'Brien's: Positive Jobe Relocation: Positive Crank: Positive Load and shift laxity: Very intense  guarding Scapular dyskinesis: none   Laboratory and Imaging Data: DG Shoulder Right  Result Date: 01/10/2023 CLINICAL DATA:  Chronic subluxation and instability. Multiple dislocations. EXAM: RIGHT SHOULDER - 2+ VIEW COMPARISON:  None Available. FINDINGS: There is no evidence of fracture or dislocation. There is no evidence of arthropathy or other focal bone abnormality. Soft tissues are unremarkable. IMPRESSION: Negative. Electronically Signed   By: Dorise Bullion III M.D.   On: 01/10/2023 07:42     Assessment and Plan:     ICD-10-CM   1. Shoulder joint instability, right  M25.311 DG Shoulder Right    MR SHOULDER RIGHT W CONTRAST    DG FLUORO GUIDED NEEDLE PLC ASPIRATION/INJECTION LOC    Ambulatory referral to Orthopedic Surgery    2. 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  The patient is doing poorly, and he has had many dislocations and subluxations.  He has had an additional dislocation within the last 2 or 3 weeks.  He has failed physical therapy.  Right now, he has an almost entirely positive stability exam of the shoulder.  Positive apprehension, positive Jobe relocation, positive crank, positive clunk, positive O'Brien's.  He also has pain with basic motion and weakness.  Obtain an MR arthrogram of the right shoulder to evaluate for labral tear and shoulder capsular tear.  Additional evaluation for Bankart and reverse Bankart.  Plan for the patient to see a shoulder surgeon, an MR arthrogram will help dictate additional definitive management.  Social: Right now his shoulder pain is limiting his basic activities of daily living, work, let alone full exercise.  Orders placed today for conditions managed today: Orders Placed This  Encounter  Procedures  . DG Shoulder Right  . MR SHOULDER RIGHT W CONTRAST  . DG FLUORO GUIDED NEEDLE PLC ASPIRATION/INJECTION LOC  . Ambulatory referral to Orthopedic Surgery    Disposition: No follow-ups on file.  Dragon [employer] One speech-to-text software was used for transcription in this dictation.  Possible transcriptional errors can occur using Editor, commissioning.   Signed,  Maud Deed. Ebony Rickel, MD   Outpatient Encounter Medications as of 01/09/2023  Medication Sig  . levETIRAcetam (KEPPRA) 750 MG tablet Take 1 tablet (750 mg total) by mouth daily.   Facility-Administered Encounter Medications as of 01/09/2023  Medication  . gadobenate dimeglumine (MULTIHANCE) injection 12 mL

## 2023-01-09 ENCOUNTER — Ambulatory Visit
Admission: RE | Admit: 2023-01-09 | Discharge: 2023-01-09 | Disposition: A | Discharge status: Home / Self Care | Payer: Managed Care, Other (non HMO) | Source: Ambulatory Visit | Attending: Family Medicine | Admitting: Family Medicine

## 2023-01-09 ENCOUNTER — Encounter: Payer: Self-pay | Admitting: Family Medicine

## 2023-01-09 ENCOUNTER — Encounter: Payer: Self-pay | Admitting: *Deleted

## 2023-01-09 ENCOUNTER — Ambulatory Visit: Payer: Managed Care, Other (non HMO) | Admitting: Family Medicine

## 2023-01-09 ENCOUNTER — Ambulatory Visit: Payer: Managed Care, Other (non HMO) | Admitting: Nurse Practitioner

## 2023-01-09 VITALS — BP 104/80 | HR 75 | Temp 98.4°F | Ht 67.0 in | Wt 130.1 lb

## 2023-01-09 DIAGNOSIS — M25311 Other instability, right shoulder: Secondary | ICD-10-CM

## 2023-01-09 DIAGNOSIS — S43014D Anterior dislocation of right humerus, subsequent encounter: Secondary | ICD-10-CM | POA: Diagnosis not present

## 2023-01-10 ENCOUNTER — Telehealth: Payer: Self-pay | Admitting: Nurse Practitioner

## 2023-01-10 NOTE — Telephone Encounter (Signed)
Shoulder x-ray results and Dr. Lillie Fragmin comments discussed with patient via telephone.  Awaiting MRI appointment.

## 2023-01-10 NOTE — Telephone Encounter (Signed)
Patient called and requested a call back about his labs. Call back number [phone].

## 2023-01-11 ENCOUNTER — Encounter: Payer: Managed Care, Other (non HMO) | Admitting: Physical Therapy

## 2023-01-18 ENCOUNTER — Encounter: Payer: Managed Care, Other (non HMO) | Admitting: Physical Therapy

## 2023-01-31 ENCOUNTER — Encounter: Payer: Managed Care, Other (non HMO) | Admitting: Physical Therapy

## 2023-02-15 ENCOUNTER — Ambulatory Visit: Payer: Managed Care, Other (non HMO) | Admitting: Gastroenterology

## 2023-05-18 ENCOUNTER — Encounter: Payer: Managed Care, Other (non HMO) | Admitting: Nurse Practitioner

## 2023-05-18 ENCOUNTER — Encounter: Payer: Self-pay | Admitting: Nurse Practitioner

## 2023-07-22 ENCOUNTER — Ambulatory Visit (HOSPITAL_COMMUNITY)
Admission: EM | Admit: 2023-07-22 | Discharge: 2023-07-22 | Disposition: A | Discharge status: Home / Self Care | Payer: Managed Care, Other (non HMO) | Attending: Internal Medicine | Admitting: Internal Medicine

## 2023-07-22 ENCOUNTER — Encounter (HOSPITAL_COMMUNITY): Payer: Self-pay

## 2023-07-22 ENCOUNTER — Ambulatory Visit (INDEPENDENT_AMBULATORY_CARE_PROVIDER_SITE_OTHER): Payer: Managed Care, Other (non HMO)

## 2023-07-22 DIAGNOSIS — S62306A Unspecified fracture of fifth metacarpal bone, right hand, initial encounter for closed fracture: Secondary | ICD-10-CM

## 2023-07-22 MED ORDER — TRAMADOL HCL 50 MG PO TABS: 50.0000 mg | ORAL_TABLET | Freq: Four times a day (QID) | ORAL | 0 refills | Status: DC | PRN

## 2023-07-22 MED ORDER — MELOXICAM 7.5 MG PO TABS: 7.5000 mg | ORAL_TABLET | Freq: Every day | ORAL | 0 refills | Status: DC

## 2023-07-22 NOTE — Discharge Instructions (Addendum)
X-ray shows that there is a break in the bone of your pinky finger in your right hand  A splint has been applied to add stability and support  Please schedule follow-up appointment with orthopedics in the next 1 to 2 weeks, they will give you a timeline of healing and let you know if anything else needs to be done for treatment  You may use tramadol every 6 hours as needed for severe pain, please be mindful this will make you feel sleepy, you may take Tylenol in addition to this  You may use meloxicam once a day as needed for pain, this helps to reduce inflammation, you may use this as an alternative to ibuprofen  If you need an extended light duty note for work please reach out to your primary doctor as they will need to fill out paperwork for insurance, also talk to your job about what paperwork is required

## 2023-07-22 NOTE — Progress Notes (Signed)
Orthopedic Tech Progress Note Patient Details:  Howard Pena 1996-08-16 [id number]  Ortho Devices Type of Ortho Device: Ulna gutter splint Ortho Device/Splint Location: RUE Ortho Device/Splint Interventions: Ordered, Application, Adjustment   Post Interventions Patient Tolerated: Well Instructions Provided: Care of device, Adjustment of device  Sherilyn Banker 07/22/2023, 1:49 PM

## 2023-07-22 NOTE — ED Triage Notes (Signed)
right hand injury onset yesterday. Patient was at work and the hand was already aching, has history of screw in the right thumb. Patient hit his hand on the table and is now swollen and reduced range of motion.

## 2023-07-22 NOTE — ED Provider Notes (Signed)
[facility]    CSN: [id number] Arrival date & time: 07/22/23  1101      History   Chief Complaint Chief Complaint  Patient presents with   Hand Injury    HPI Azael Ragain is a 27 y.o. male.   Patient presents for evaluation of pain and swelling to the right hand beginning 1 day ago after hitting the hand against a table.  Pain experience when flexing the wrist but able to complete range of motion of the hand.  Denies numbness or tingling.  Has been experiencing a constant throbbing sensation.  Has attempted use of ibuprofen which has been ineffective.  Has a screw to the right thumb due to prior injury.  Denies numbness or tingling.   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   Frequent bowel movements 11/17/2022   Sleep deprivation seizure ([facility]) 04/14/2020   Convulsion ([facility]) 03/30/2020   Shifting sleep-work schedule, affecting sleep 02/07/2020   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   Peritonsillar abscess 01/28/2020   Physical growth delay    Constitutional growth delay    Goiter    Poor appetite    Puberty delay    Lack of expected normal physiological development in childhood 04/21/2011   Goiter, unspecified 04/21/2011   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 Authorizing Provider  levETIRAcetam (KEPPRA) 750 MG tablet Take 1 tablet (750 mg total) by mouth daily. 11/17/22   Eden Emms, NP    Family History Family History  Problem Relation  Age of Onset   Thyroid disease Mother    Post-traumatic stress disorder Father    Stomach cancer Other    Cancer Maternal Great-grandmother        not sure of the type    Social History Social History   Tobacco Use   Smoking status: Some Days    Types: Cigars, E-cigarettes   Smokeless tobacco: Never   Tobacco comments:    quit 2 months ago  Vaping Use   Vaping status: Some Days   Substances: Nicotine, Flavoring  Substance Use Topics   Alcohol use: Yes    Comment: on the weekends sometimes. depends on the occassion beers   Drug use: No     Allergies   Amoxicillin   Review of Systems Review of Systems   Physical Exam Triage Vital Signs ED Triage Vitals  Encounter Vitals Group     BP 07/22/23 1213 (!) 138/98     Systolic BP Percentile --      Diastolic BP Percentile --      Pulse Rate 07/22/23 1213 68     Resp 07/22/23 1213 16     Temp 07/22/23 1213 98 F (36.7 C)     Temp Source 07/22/23 1213 Oral     SpO2 07/22/23 1213 98 %     Weight 07/22/23 1213 130  lb (59 kg)     Height 07/22/23 1213 5\' 8"  (1.727 m)     Head Circumference --      Peak Flow --      Pain Score 07/22/23 1212 7     Pain Loc --      Pain Education --      Exclude from Growth Chart --    No data found.  Updated Vital Signs BP (!) 138/98 (BP Location: Left Arm)   Pulse 68   Temp 98 F (36.7 C) (Oral)   Resp 16   Ht 5\' 8"  (1.727 m)   Wt 130 lb (59 kg)   SpO2 98%   BMI 19.77 kg/m   Visual Acuity Right Eye Distance:   Left Eye Distance:   Bilateral Distance:    Right Eye Near:   Left Eye Near:    Bilateral Near:     Physical Exam Constitutional:      Appearance: Normal appearance.  Eyes:     Extraocular Movements: Extraocular movements intact.  Pulmonary:     Effort: Pulmonary effort is normal.  Musculoskeletal:     Comments: Tenderness and mild to moderate swelling present along the base of the right fifth finger, no ecchymosis or deformity noted, 2+ radial pulse, able  to extend and flex all 5 digits, pain elicited with range of motion of the right wrist  Neurological:     Mental Status: He is alert and oriented to person, place, and time. Mental status is at baseline.      UC Treatments / Results  Labs (all labs ordered are listed, but only abnormal results are displayed) Labs Reviewed - No data to display  EKG   Radiology No results found.  Procedures Procedures (including critical care time)  Medications Ordered in UC Medications - No data to display  Initial Impression / Assessment and Plan / UC Course  I have reviewed the triage vital signs and the nursing notes.  Pertinent labs & imaging results that were available during my care of the patient were reviewed by me and considered in my medical decision making (see chart for details).  Closed nondisplaced fracture of the fifth metacarpal bone of right hand, initial encounter  Fracture confirmed on x-ray, discussed with patient, ulnar gutter applied by orthopedic staff, neurovascularly intact prior to and after placement, tramadol prescribed, PDMP reviewed, low risk, meloxicam sent additionally, advised orthopedic follow-up in 1 to 2 weeks for reevaluation, may follow-up with urgent care as needed, work note given for light duty, advised follow-up with PCP for extended note and FMLA paperwork as needed Final Clinical Impressions(s) / UC Diagnoses   Final diagnoses:  None   Discharge Instructions   None    ED Prescriptions   None    PDMP not reviewed this encounter.   Valinda Hoar, NP 07/22/23 1320

## 2023-08-27 ENCOUNTER — Other Ambulatory Visit: Payer: Self-pay

## 2023-08-27 ENCOUNTER — Encounter (HOSPITAL_COMMUNITY): Payer: Self-pay | Admitting: Emergency Medicine

## 2023-08-27 ENCOUNTER — Ambulatory Visit (HOSPITAL_COMMUNITY)
Admission: EM | Admit: 2023-08-27 | Discharge: 2023-08-27 | Disposition: A | Discharge status: Home / Self Care | Payer: Managed Care, Other (non HMO) | Attending: Emergency Medicine | Admitting: Emergency Medicine

## 2023-08-27 DIAGNOSIS — R110 Nausea: Secondary | ICD-10-CM

## 2023-08-27 MED ORDER — ONDANSETRON 4 MG PO TBDP: 4.0000 mg | ORAL_TABLET | Freq: Four times a day (QID) | ORAL | 0 refills | Status: DC | PRN

## 2023-08-27 NOTE — ED Triage Notes (Signed)
Pt c/o nausea and fatigue that started today, states he don't think he can go to work today.

## 2023-08-27 NOTE — ED Provider Notes (Signed)
[facility]    CSN: [id number] Arrival date & time: 08/27/23  1710     History   Chief Complaint Chief Complaint  Patient presents with   Nausea    HPI Howard Pena is a 27 y.o. male.  Here with nausea, last night and this morning Not currently having symptoms  No abdominal pain or vomiting. No fever He had to call out of work due to feeling poorly. Needs a note.  Reports history of nausea on and off, may be related to his seizure medication.  He has not seen neurology in a couple years, but he does have a PCP. Takes Keppra daily  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   Frequent bowel movements 11/17/2022   Sleep deprivation seizure ([facility]) 04/14/2020   Convulsion ([facility]) 03/30/2020   Shifting sleep-work schedule, affecting sleep 02/07/2020   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   Peritonsillar abscess 01/28/2020   Physical growth delay    Constitutional growth delay    Goiter    Poor appetite    Puberty delay    Lack of expected normal physiological development in childhood 04/21/2011   Goiter, unspecified 04/21/2011   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 Authorizing Provider  ondansetron (ZOFRAN-ODT) 4 MG disintegrating tablet Take 1 tablet (4 mg total) by mouth every 6 (six) hours as needed for nausea or vomiting. 08/27/23  Yes Sham Alviar, Lurena Joiner, PA-C  levETIRAcetam (KEPPRA) 750 MG tablet Take 1 tablet (750 mg total) by mouth daily. 11/17/22    Eden Emms, NP  meloxicam (MOBIC) 7.5 MG tablet Take 1 tablet (7.5 mg total) by mouth daily. 07/22/23   White, Elita Boone, NP  traMADol (ULTRAM) 50 MG tablet Take 1 tablet (50 mg total) by mouth every 6 (six) hours as needed. 07/22/23   Valinda Hoar, NP    Family History Family History  Problem Relation Age of Onset   Thyroid disease Mother    Post-traumatic stress disorder Father    Stomach cancer Other    Cancer Maternal Great-grandmother        not sure of the type    Social History Social History   Tobacco Use   Smoking status: Some Days    Types: Cigars, E-cigarettes   Smokeless tobacco: Never   Tobacco comments:    quit 2 months ago  Vaping Use   Vaping status: Some Days   Substances: Nicotine, Flavoring  Substance Use Topics   Alcohol use: Yes    Comment: on the weekends sometimes. depends on the occassion beers   Drug use: No     Allergies   Amoxicillin   Review of Systems Review of Systems Per HPI  Physical Exam Triage Vital Signs ED Triage Vitals  Encounter Vitals Group     BP 08/27/23 1730 120/80     Systolic BP Percentile --  Diastolic BP Percentile --      Pulse Rate 08/27/23 1730 100     Resp 08/27/23 1730 18     Temp 08/27/23 1730 98 F (36.7 C)     Temp Source 08/27/23 1730 Oral     SpO2 08/27/23 1730 98 %     Weight --      Height --      Head Circumference --      Peak Flow --      Pain Score 08/27/23 1731 0     Pain Loc --      Pain Education --      Exclude from Growth Chart --    No data found.  Updated Vital Signs BP 120/80 (BP Location: Right Arm)   Pulse 100   Temp 98 F (36.7 C) (Oral)   Resp 18   SpO2 98%   Visual Acuity Right Eye Distance:   Left Eye Distance:   Bilateral Distance:    Right Eye Near:   Left Eye Near:    Bilateral Near:     Physical Exam Vitals and nursing note reviewed.  Constitutional:      Appearance: Normal appearance.  HENT:     Mouth/Throat:     Mouth: Mucous membranes  are moist.     Pharynx: Oropharynx is clear.  Eyes:     Conjunctiva/sclera: Conjunctivae normal.  Cardiovascular:     Rate and Rhythm: Normal rate and regular rhythm.     Heart sounds: Normal heart sounds.  Pulmonary:     Effort: Pulmonary effort is normal.     Breath sounds: Normal breath sounds.  Abdominal:     General: Bowel sounds are normal.     Palpations: Abdomen is soft.     Tenderness: There is no abdominal tenderness. There is no right CVA tenderness, left CVA tenderness, guarding or rebound.  Musculoskeletal:        General: Normal range of motion.  Neurological:     Mental Status: He is alert and oriented to person, place, and time.      UC Treatments / Results  Labs (all labs ordered are listed, but only abnormal results are displayed) Labs Reviewed - No data to display  EKG   Radiology No results found.  Procedures Procedures (including critical care time)  Medications Ordered in UC Medications - No data to display  Initial Impression / Assessment and Plan / UC Course  I have reviewed the triage vital signs and the nursing notes.  Pertinent labs & imaging results that were available during my care of the patient were reviewed by me and considered in my medical decision making (see chart for details).  Afebrile, well-appearing, nontender on exam Not currently nauseous. Just needs note for work  Sent Zofran to use q6 hours prn, advised contact PCP and neurology for follow-up. Work note provided.  Patient without questions at this time  Final Clinical Impressions(s) / UC Diagnoses   Final diagnoses:  Nausea     Discharge Instructions      The zofran can be used every 6 hours if needed for nausea Continue your seizure medicine daily Call your neurologist to make an appointment for follow up  Please go to the emergency department if symptoms worsen.    ED Prescriptions     Medication Sig Dispense Auth. Provider   ondansetron (ZOFRAN-ODT) 4  MG disintegrating tablet Take 1 tablet (4 mg total) by mouth every 6 (six) hours as needed for nausea  or vomiting. 20 tablet Zyah Gomm, Lurena Joiner, PA-C      PDMP not reviewed this encounter.   Marlow Baars, [place] 08/27/23 1821

## 2023-08-27 NOTE — Discharge Instructions (Signed)
The zofran can be used every 6 hours if needed for nausea Continue your seizure medicine daily Call your neurologist to make an appointment for follow up  Please go to the emergency department if symptoms worsen.

## 2023-09-22 ENCOUNTER — Ambulatory Visit (INDEPENDENT_AMBULATORY_CARE_PROVIDER_SITE_OTHER): Payer: Managed Care, Other (non HMO)

## 2023-09-22 ENCOUNTER — Ambulatory Visit (HOSPITAL_COMMUNITY)
Admission: EM | Admit: 2023-09-22 | Discharge: 2023-09-22 | Disposition: A | Discharge status: Home / Self Care | Payer: Managed Care, Other (non HMO) | Attending: Emergency Medicine | Admitting: Emergency Medicine

## 2023-09-22 ENCOUNTER — Encounter (HOSPITAL_COMMUNITY): Payer: Self-pay | Admitting: Emergency Medicine

## 2023-09-22 DIAGNOSIS — S42292A Other displaced fracture of upper end of left humerus, initial encounter for closed fracture: Secondary | ICD-10-CM

## 2023-09-22 NOTE — Discharge Instructions (Addendum)
You have a fracture of your proximal humeral head.  Please wear the sling throughout the day and at night.  It might be easier to sleep in a recliner.  You can alternate between and her milligrams of ibuprofen and 500 mg of Tylenol every 4-6 hours for pain and inflammation.  Please follow-up with orthopedic within the next 4 days to a week to ensure routine healing.  Total healing time is usually around 6 to 12 weeks for this type of fracture.

## 2023-09-22 NOTE — ED Triage Notes (Signed)
Pt c/o left should pain after hitting should on wall while playing with son on Saturday.

## 2023-09-22 NOTE — ED Provider Notes (Signed)
[facility]    CSN: [id number] Arrival date & time: 09/22/23  1109      History   Chief Complaint No chief complaint on file.   HPI Howard Pena is a 27 y.o. male.   Patient presents to clinic for left shoulder pain and limitation to range of motion.  Last Saturday, 6 days prior, he was chasing his son who rounded a corner suddenly.  He tried to follow him and ran his shoulder into the wall.   He is able to curl bicep and extend arm, unable to extend arm above 45 degrees without pain. Has not tried any interventions for pain. No previous shoulder injuries.   The history is provided by the patient and medical records.    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 Authorizing Provider  levETIRAcetam (KEPPRA) 750 MG tablet Take 1 tablet (750 mg total) by mouth daily. 11/17/22   Eden Emms, NP  meloxicam (MOBIC) 7.5 MG tablet Take 1 tablet (7.5 mg total) by mouth  daily. Patient not taking: Reported on 09/22/2023 07/22/23   Valinda Hoar, NP  ondansetron (ZOFRAN-ODT) 4 MG disintegrating tablet Take 1 tablet (4 mg total) by mouth every 6 (six) hours as needed for nausea or vomiting. Patient not taking: Reported on 09/22/2023 08/27/23   Rising, Lurena Joiner, PA-C  traMADol (ULTRAM) 50 MG tablet Take 1 tablet (50 mg total) by mouth every 6 (six) hours as needed. Patient not taking: Reported on 09/22/2023 07/22/23   Valinda Hoar, NP    Family History Family History  Problem Relation Age of Onset   Thyroid disease Mother    Post-traumatic stress disorder Father    Stomach cancer Other    Cancer Maternal Great-grandmother        not sure of the type    Social History Social History   Tobacco Use   Smoking status: Former    Types: Cigars, E-cigarettes   Smokeless tobacco: Never   Tobacco comments:    quit 2 months ago  Vaping Use   Vaping status: Every Day   Substances: Nicotine, Flavoring  Substance Use Topics   Alcohol use: Yes    Comment: on the weekends sometimes. depends on the occassion beers   Drug use: No     Allergies   Amoxicillin   Review of Systems Review  of Systems  All other systems reviewed and are negative.    Physical Exam Triage Vital Signs ED Triage Vitals  Encounter Vitals Group     BP 09/22/23 1148 (!) 142/95     Systolic BP Percentile --      Diastolic BP Percentile --      Pulse Rate 09/22/23 1148 77     Resp 09/22/23 1148 16     Temp 09/22/23 1148 98.2 F (36.8 C)     Temp Source 09/22/23 1148 Oral     SpO2 09/22/23 1148 99 %     Weight --      Height --      Head Circumference --      Peak Flow --      Pain Score 09/22/23 1145 7     Pain Loc --      Pain Education --      Exclude from Growth Chart --    No data found.  Updated Vital Signs BP (!) 142/95 (BP Location: Right Arm)   Pulse 77   Temp 98.2 F (36.8 C) (Oral)   Resp 16   SpO2 99%   Visual Acuity Right Eye Distance:   Left  Eye Distance:   Bilateral Distance:    Right Eye Near:   Left Eye Near:    Bilateral Near:     Physical Exam Vitals and nursing note reviewed.  Constitutional:      Appearance: Normal appearance.  HENT:     Head: Normocephalic and atraumatic.     Right Ear: External ear normal.     Left Ear: External ear normal.     Nose: Nose normal.     Mouth/Throat:     Mouth: Mucous membranes are moist.  Eyes:     Conjunctiva/sclera: Conjunctivae normal.  Cardiovascular:     Rate and Rhythm: Normal rate.  Pulmonary:     Effort: Pulmonary effort is normal. No respiratory distress.  Musculoskeletal:        General: Tenderness and signs of injury present. No swelling or deformity.     Left shoulder: Tenderness present. No swelling, deformity, effusion or crepitus. Decreased range of motion. Normal strength. Normal pulse.     Comments: Very limited forward flexion of left shoulder.   Skin:    General: Skin is warm and dry.  Neurological:     General: No focal deficit present.     Mental Status: He is alert and oriented to person, place, and time.  Psychiatric:        Mood and Affect: Mood normal.        Behavior: Behavior normal. Behavior is cooperative.      UC Treatments / Results  Labs (all labs ordered are listed, but only abnormal results are displayed) Labs Reviewed - No data to display  EKG   Radiology DG Shoulder Left  Result Date: 09/22/2023 CLINICAL DATA:  Left shoulder pain.  Right into a wall. EXAM: LEFT SHOULDER - 2+ VIEW COMPARISON:  None Available. FINDINGS: Minimally displaced fracture of the lateral humeral head in the region of the rotator cuff insertion, mildly comminuted. No intra-articular involvement. No other fracture of the shoulder. Glenohumeral joint is normal. Acromioclavicular joint is normal. The included ribs are intact IMPRESSION: Minimally displaced fracture of the lateral humeral head in the region of the rotator cuff insertion. Electronically Signed    By: Narda Rutherford M.D.   On: 09/22/2023 12:50    Procedures Procedures (including critical care time)  Medications Ordered in UC Medications - No data to display  Initial Impression / Assessment and Plan / UC Course  I have reviewed the triage vital signs and the nursing notes.  Pertinent labs & imaging results that were available during my care of the patient were reviewed by me and considered in my medical decision making (see chart for details).  Vitals and triage reviewed, patient is hemodynamically stable.  Significant left shoulder limitation with range of motion, unable to flex to 90 or 180 degrees after traumatic injury 6 days prior.  Imaging shows a minimally displaced fracture of the lateral humeral head in the region of the rotator cuff insertion.  Treated with immobilization of sling, Ortho follow-up in 4 to 7 days.  Plan of care, follow-up care and return precautions discussed as well as pain management, no questions at this time.     Final Clinical Impressions(s) / UC Diagnoses   Final diagnoses:  Humeral head fracture, left, closed, initial encounter     Discharge Instructions      You have a fracture of your proximal humeral head.  Please wear the sling throughout the day and at night.  It might be easier to sleep in a recliner.  You can alternate between and her milligrams of ibuprofen and 500 mg of Tylenol every 4-6 hours for pain and inflammation.  Please follow-up with orthopedic within the next 4 days to a week to ensure routine healing.  Total healing time is usually around 6 to 12 weeks for this type of fracture.     ED Prescriptions   None    PDMP not reviewed this encounter.   Lerline Valdivia, [place], [place] 09/22/23 1302

## 2023-09-23 ENCOUNTER — Ambulatory Visit (HOSPITAL_COMMUNITY): Payer: Managed Care, Other (non HMO)

## 2023-09-30 ENCOUNTER — Encounter (HOSPITAL_COMMUNITY): Payer: Self-pay

## 2023-09-30 ENCOUNTER — Ambulatory Visit (HOSPITAL_COMMUNITY)
Admission: EM | Admit: 2023-09-30 | Discharge: 2023-09-30 | Disposition: A | Discharge status: Home / Self Care | Payer: Managed Care, Other (non HMO) | Attending: Emergency Medicine | Admitting: Emergency Medicine

## 2023-09-30 ENCOUNTER — Ambulatory Visit (HOSPITAL_COMMUNITY): Payer: Managed Care, Other (non HMO)

## 2023-09-30 ENCOUNTER — Ambulatory Visit (INDEPENDENT_AMBULATORY_CARE_PROVIDER_SITE_OTHER): Payer: Managed Care, Other (non HMO)

## 2023-09-30 DIAGNOSIS — S42292K Other displaced fracture of upper end of left humerus, subsequent encounter for fracture with nonunion: Secondary | ICD-10-CM | POA: Diagnosis not present

## 2023-09-30 NOTE — ED Triage Notes (Signed)
LT shoulder pain x a week.  Was seen a week ago for a fracture in the left shoulder. Patient takes Sheralyn Boatman for seizures, woke up on the floor and with increased shoulder pain. This happened last night.   States the arm was starting to get better. States his fiance told him he had a seizure in his sleep.

## 2023-09-30 NOTE — ED Provider Notes (Signed)
[facility]    CSN: [id number] Arrival date & time: 09/30/23  1422      History   Chief Complaint Chief Complaint  Patient presents with   Shoulder Pain    HPI Howard Pena is a 27 y.o. male.   Patient presents to clinic for left shoulder pain.  He was seen for a humeral head fracture on 10/4 and provided with a sling.  Patient reports since last visit pain has been well-controlled and he is not to take any medication.  He has been doing shoulder rehab with improvements in range of motion.  He woke up last night and noticed that shoulder was hurting, this was abnormal. Range of motion is now painful and restricted. He was wearing his sling. He has been taking Keppra as prescribed, is unsure if he had a breakthrough seizure.  He went to sleep in the bed and woke up in the bed. He was a little nauseated. Fiance was asleep next to him and did not wake up.      The history is provided by the patient and medical records.  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 Authorizing Provider  levETIRAcetam (KEPPRA) 750 MG tablet Take 1 tablet (750 mg total) by mouth daily. 11/17/22  Yes Eden Emms, NP    Family History Family History  Problem Relation Age of Onset   Thyroid disease Mother    Post-traumatic stress disorder Father    Stomach cancer Other    Cancer Maternal Great-grandmother        not sure of the type    Social History Social History   Tobacco Use   Smoking status: Former    Types: Cigars, E-cigarettes   Smokeless tobacco: Never   Tobacco comments:    quit 2 months ago  Vaping Use   Vaping status: Every Day   Substances: Nicotine, Flavoring  Substance Use Topics   Alcohol use: Yes    Comment: on the weekends sometimes. depends on the occassion beers   Drug use: No     Allergies   Amoxicillin   Review of Systems Review of Systems  All other systems reviewed and are negative.    Physical Exam Triage Vital Signs ED Triage Vitals  Encounter Vitals Group     BP 09/30/23 1529 137/88  Systolic BP Percentile --      Diastolic BP Percentile --      Pulse Rate 09/30/23 1529 88     Resp 09/30/23 1529 16     Temp 09/30/23 1529 98.2 F (36.8 C)     Temp Source 09/30/23 1529 Oral     SpO2 09/30/23 1529 96 %     Weight 09/30/23 1529 130 lb (59 kg)     Height 09/30/23 1529 5\' 8"  (1.727 m)     Head Circumference --      Peak Flow --      Pain Score 09/30/23 1527 8     Pain Loc --      Pain Education --      Exclude from Growth Chart --    No data found.  Updated Vital Signs BP 137/88 (BP Location: Right Wrist)   Pulse 88   Temp 98.2 F (36.8 C) (Oral)   Resp 16   Ht 5\' 8"  (1.727 m)   Wt 130 lb (59 kg)   SpO2 96%   BMI 19.77 kg/m   Visual Acuity Right Eye Distance:   Left Eye Distance:   Bilateral Distance:    Right Eye Near:   Left Eye Near:    Bilateral Near:     Physical  Exam Vitals and nursing note reviewed.  Constitutional:      Appearance: Normal appearance.  HENT:     Head: Normocephalic and atraumatic.     Right Ear: External ear normal.     Left Ear: External ear normal.     Nose: Nose normal.     Mouth/Throat:     Mouth: Mucous membranes are moist.  Eyes:     Conjunctiva/sclera: Conjunctivae normal.  Cardiovascular:     Rate and Rhythm: Normal rate.  Pulmonary:     Effort: Pulmonary effort is normal. No respiratory distress.  Musculoskeletal:        General: Tenderness and signs of injury present.  Skin:    General: Skin is warm and dry.  Neurological:     General: No focal deficit present.     Mental Status: He is alert and oriented to person, place, and time.  Psychiatric:        Mood and Affect: Mood normal.        Behavior: Behavior normal. Behavior is cooperative.      UC Treatments / Results  Labs (all labs ordered are listed, but only abnormal results are displayed) Labs Reviewed - No data to display  EKG   Radiology No results found.  Procedures Procedures (including critical care time)  Medications Ordered in UC Medications - No data to display  Initial Impression / Assessment and Plan / UC Course  I have reviewed the triage vital signs and the nursing notes.  Pertinent labs & imaging results that were available during my care of the patient were reviewed by me and considered in my medical decision making (see chart for details).  Vitals and triage reviewed, patient is hemodynamically stable.  Significant left shoulder pain and limitation to range of motion.  Doubt breakthrough seizures patient has been compliant with Keppra and no history of a breakthrough seizure for years.  Fianc was asleep next to him and did not wake. Imaging shows now displaced fx of humeral head, new from 10/4.  Discussed case with Dr. Christell Constant, with Ortho care who recommended outpatient Ortho follow-up for potential surgical discussion.  Patient verbalized understanding, no questions at this time.  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  Please call the orthopedic number listed for follow-up on Monday, as this may require surgery.  You can take 800 mg of ibuprofen and 500 mg of Tylenol, alternating every 4-6 hours.  Return to clinic for new or urgent symptoms.      ED Prescriptions   None    I have reviewed the PDMP during this encounter.   Howard Pena, [place], [place] 09/30/23 [phone]

## 2023-09-30 NOTE — Discharge Instructions (Addendum)
Continue to wear your sling at all times.  Please call the orthopedic number listed for follow-up on Monday, as this may require surgery.  You can take 800 mg of ibuprofen and 500 mg of Tylenol, alternating every 4-6 hours.  Return to clinic for new or urgent symptoms.

## 2023-10-03 ENCOUNTER — Ambulatory Visit (HOSPITAL_BASED_OUTPATIENT_CLINIC_OR_DEPARTMENT_OTHER): Payer: Managed Care, Other (non HMO) | Admitting: Student

## 2023-10-03 ENCOUNTER — Encounter (HOSPITAL_BASED_OUTPATIENT_CLINIC_OR_DEPARTMENT_OTHER): Payer: Self-pay | Admitting: Student

## 2023-10-03 DIAGNOSIS — S42252A Displaced fracture of greater tuberosity of left humerus, initial encounter for closed fracture: Secondary | ICD-10-CM | POA: Diagnosis not present

## 2023-10-03 NOTE — Progress Notes (Signed)
Chief Complaint: Left shoulder pain     History of Present Illness:    Howard Pena is a 27 y.o. male presenting today for evaluation of left shoulder pain.  He reports initially injuring his shoulder about 3 weeks ago when he hit it on a wall while chasing his son.  He was evaluated in urgent care and was placed in a sling with recommended orthopedic follow-up.  X-rays at the time showed a nondisplaced fracture of the greater tuberosity.  He reports that he began doing home exercises that he had done for previous right shoulder injury and was only wearing the sling at night.  He works at the [employer] and has been lifting heavy boxes.  3 days ago he had sudden increased pain in the middle of the night.  Range of motion is significantly limited and then he is unable to lift his left arm.  X-rays of his shoulder showed increased displacement of fracture.  He has been wearing the sling and is taking ibuprofen as needed.  Denies any numbness or tingling.   Surgical History:   None  PMH/PSH/Family History/Social History/Meds/Allergies:    Past Medical History:  Diagnosis Date   Constitutional growth delay    Goiter    Physical growth delay    Poor appetite    Puberty delay    Seizures (HCC)    Past Surgical History:  Procedure Laterality Date   HAND SURGERY Right    has screw in the right wrist/thumb area   right ear surgery     Social History   Socioeconomic History   Marital status: Single    Spouse name: Not on file   Number of children: 1   Years of education: Not on file   Highest education level: Not on file  Occupational History   Not on file  Tobacco Use   Smoking status: Former    Types: Cigars, E-cigarettes   Smokeless tobacco: Never   Tobacco comments:    quit 2 months ago  Vaping Use   Vaping status: Every Day   Substances: Nicotine, Flavoring  Substance and Sexual Activity   Alcohol use: Yes     Comment: on the weekends sometimes. depends on the occassion beers   Drug use: No   Sexual activity: Yes    Birth control/protection: None  Other Topics Concern   Not on file  Social History Narrative   Christiaan (3)   Is engaged to [employer]   Fulltime: order selector at [employer]         Hobbies: none   Social Determinants of Health   Financial Resource Strain: Not on file  Food Insecurity: Not on file  Transportation Needs: Not on file  Physical Activity: Not on file  Stress: Not on file  Social Connections: Not on file   Family History  Problem Relation Age of Onset   Thyroid disease Mother    Post-traumatic stress disorder Father    Stomach cancer Other    Cancer Maternal Great-grandmother        not sure of the type   Allergies  Allergen Reactions   Amoxicillin Other (See Comments)    Did it involve swelling of the face/tongue/throat, SOB, or low BP? Unknown Did it involve sudden or severe rash/hives, skin peeling, or  any reaction on the inside of your mouth or nose? Unknown Did you need to seek medical attention at a hospital or doctor's office? Unknown When did it last happen?  pt was a child     If all above answers are "NO", may proceed with cephalosporin use.    Current Outpatient Medications  Medication Sig Dispense Refill   levETIRAcetam (KEPPRA) 750 MG tablet Take 1 tablet (750 mg total) by mouth daily. 90 tablet 1   No current facility-administered medications for this visit.   Facility-Administered Medications Ordered in Other Visits  Medication Dose Route Frequency Provider Last Rate Last Admin   gadobenate dimeglumine (MULTIHANCE) injection 12 mL  12 mL Intravenous Once PRN Dohmeier, Porfirio Mylar, MD       No results found.  Review of Systems:   A ROS was performed including pertinent positives and negatives as documented in the HPI.  Physical Exam :   Constitutional: NAD and appears stated age Neurological: Alert and oriented Psych:  Appropriate affect and cooperative There were no vitals taken for this visit.   Comprehensive Musculoskeletal Exam:    Patient's left arm is immobilized in a sling.  Tenderness over the anterior glenohumeral joint and lateral deltoid.  Extremely limited forward elevation of the shoulder in the sling.  Radial pulse 2+.  Grip strength 5/5 bilaterally.  Imaging:   Xray review from urgent care on 09/30/2023 (left shoulder 3 views): Displaced greater tuberosity fracture.  Fragment is now positioned immediately superior to the humeral head.   I personally reviewed and interpreted the radiographs.   Assessment:   27 y.o. male with a displaced proximal left humerus fracture of the greater tuberosity.  He denies any repeat injury however I explained that continued use of the shoulder likely caused the rotator cuff tendons to avulse the fracture.  Given the amount of displacement I do believe surgical intervention will be indicated.  I will plan to work him into see Dr. Steward Drone to discuss this further.  In the meantime he can remain in the sling and continue current pain regimen.  Plan :    -Return to clinic for surgical discussion with Dr. Steward Drone     I personally saw and evaluated the patient, and participated in the management and treatment plan.  Hazle Nordmann, PA-C Orthopedics

## 2023-10-04 ENCOUNTER — Ambulatory Visit (HOSPITAL_BASED_OUTPATIENT_CLINIC_OR_DEPARTMENT_OTHER): Payer: Managed Care, Other (non HMO) | Admitting: Orthopaedic Surgery

## 2023-10-04 ENCOUNTER — Other Ambulatory Visit (HOSPITAL_BASED_OUTPATIENT_CLINIC_OR_DEPARTMENT_OTHER): Payer: Self-pay

## 2023-10-04 DIAGNOSIS — S42252A Displaced fracture of greater tuberosity of left humerus, initial encounter for closed fracture: Secondary | ICD-10-CM

## 2023-10-04 MED ORDER — ASPIRIN 325 MG PO TBEC
325.0000 mg | DELAYED_RELEASE_TABLET | Freq: Every day | ORAL | 0 refills | Status: DC
  Filled 2023-10-04: qty 30, 30d supply, fill #0

## 2023-10-04 MED ORDER — OXYCODONE HCL 5 MG PO TABS
5.0000 mg | ORAL_TABLET | ORAL | 0 refills | Status: DC | PRN
  Filled 2023-10-04: qty 10, 2d supply, fill #0

## 2023-10-04 MED ORDER — ACETAMINOPHEN 500 MG PO TABS
500.0000 mg | ORAL_TABLET | Freq: Three times a day (TID) | ORAL | 0 refills | Status: AC
  Filled 2023-10-04: qty 30, 10d supply, fill #0

## 2023-10-04 MED ORDER — IBUPROFEN 800 MG PO TABS
800.0000 mg | ORAL_TABLET | Freq: Three times a day (TID) | ORAL | 0 refills | Status: AC
  Filled 2023-10-04: qty 30, 10d supply, fill #0

## 2023-10-04 NOTE — Progress Notes (Signed)
Chief Complaint: Left shoulder pain        History of Present Illness:    10/04/2023: Presents today for follow-up of the left shoulder.  At this time he has essentially no active forward elevation of the left shoulder   Howard Pena is a 27 y.o. male presenting today for evaluation of left shoulder pain.  He reports initially injuring his shoulder about 3 weeks ago when he hit it on a wall while chasing his son.  He was evaluated in urgent care and was placed in a sling with recommended orthopedic follow-up.  X-rays at the time showed a nondisplaced fracture of the greater tuberosity.  He reports that he began doing home exercises that he had done for previous right shoulder injury and was only wearing the sling at night.  He works at the [employer] and has been lifting heavy boxes.  3 days ago he had sudden increased pain in the middle of the night.  Range of motion is significantly limited and then he is unable to lift his left arm.  X-rays of his shoulder showed increased displacement of fracture.  He has been wearing the sling and is taking ibuprofen as needed.  Denies any numbness or tingling.     Surgical History:   None   PMH/PSH/Family History/Social History/Meds/Allergies:         Past Medical History:  Diagnosis Date   Constitutional growth delay     Goiter     Physical growth delay     Poor appetite     Puberty delay     Seizures (HCC)               Past Surgical History:  Procedure Laterality Date   HAND SURGERY Right      has screw in the right wrist/thumb area   right ear surgery            Social History         Socioeconomic History   Marital status: Single      Spouse name: Not on file   Number of children: 1   Years of education: Not on file   Highest education level: Not on file  Occupational History   Not on file  Tobacco Use   Smoking status: Former      Types: Cigars, E-cigarettes    Smokeless tobacco: Never   Tobacco comments:      quit 2 months ago  Vaping Use   Vaping status: Every Day   Substances: Nicotine, Flavoring  Substance and Sexual Activity   Alcohol use: Yes      Comment: on the weekends sometimes. 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[employer]              Hobbies: none    Social Determinants of Health    Financial Resource Strain: Not on file  Food Insecurity: Not on file  Transportation Needs: Not on file  Physical Activity: Not on file  Stress: Not on file  Social Connections: Not on file         Family History  Problem Relation Age of Onset   Thyroid disease Mother  Post-traumatic stress disorder Father     Stomach cancer Other     Cancer Maternal Great-grandmother          not sure of the type        Allergies       Allergies  Allergen Reactions   Amoxicillin Other (See Comments)      Did it involve swelling of the face/tongue/throat, SOB, or low BP? Unknown Did it involve sudden or severe rash/hives, skin peeling, or any reaction on the inside of your mouth or nose? Unknown Did you need to seek medical attention at a hospital or doctor's office? Unknown When did it last happen?  pt was a child     If all above answers are "NO", may proceed with cephalosporin use.              Current Outpatient Medications  Medication Sig Dispense Refill   levETIRAcetam (KEPPRA) 750 MG tablet Take 1 tablet (750 mg total) by mouth daily. 90 tablet 1      No current facility-administered medications for this visit.             Facility-Administered Medications Ordered in Other Visits  Medication Dose Route Frequency Provider Last Rate Last Admin   gadobenate dimeglumine (MULTIHANCE) injection 12 mL  12 mL Intravenous Once  PRN Dohmeier, Porfirio Mylar, MD          Imaging Results (Last 48 hours)  No results found.     Review of Systems:   A ROS was performed including pertinent positives and negatives as documented in the HPI.   Physical Exam :   Constitutional: NAD and appears stated age Neurological: Alert and oriented Psych: Appropriate affect and cooperative There were no vitals taken for this visit.    Comprehensive Musculoskeletal Exam:     Patient's left arm is immobilized in a sling.  Tenderness over the anterior glenohumeral joint and lateral deltoid.  Extremely limited forward elevation of the shoulder in the sling.  Radial pulse 2+.  Grip strength 5/5 bilaterally.   Imaging:   Xray review from urgent care on 09/30/2023 (left shoulder 3 views): Displaced greater tuberosity fracture.  Fragment is now positioned immediately superior to the humeral head.     I personally reviewed and interpreted the radiographs.     Assessment:   27 y.o. male with a displaced proximal left humerus fracture of the greater tuberosity.  He denies any repeat injury however I explained that continued use of the shoulder likely caused the rotator cuff tendons to avulse the fracture.  At this time I did describe that given his loss of active overhead motion I would recommend surgical fixation with a mini open deltoid approach with greater tuberosity fixation with suture anchors.  Will plan to proceed with this today.  I did describe all the risks and limitations.   Plan :     -Plan for mini open left greater tuberosity open reduction internal fixation   After a lengthy discussion of treatment options, including risks, benefits, alternatives, complications of surgical and nonsurgical conservative options, the patient elected surgical repair.   The patient  is aware of the material risks  and complications including, but not limited to injury to adjacent structures, neurovascular injury, infection, numbness, bleeding,  implant failure, thermal burns, stiffness, persistent pain, failure to heal, disease transmission from allograft, need for further surgery, dislocation, anesthetic risks, blood clots, risks of death,and others. The probabilities of surgical success and failure discussed with patient given their  particular co-morbidities.The time and nature of expected rehabilitation and recovery was discussed.The patient's questions were all answered preoperatively.  No barriers to understanding were noted. I explained the natural history of the disease process and Rx rationale.  I explained to the patient what I considered to be reasonable expectations given their personal situation.  The final treatment plan was arrived at through a shared patient decision making process model.          I personally saw and evaluated the patient, and participated in the management and treatment plan.

## 2023-10-04 NOTE — H&P (View-Only) (Signed)
Chief Complaint: Left shoulder pain        History of Present Illness:    10/04/2023: Presents today for follow-up of the left shoulder.  At this time he has essentially no active forward elevation of the left shoulder   Howard Pena is a 27 y.o. male presenting today for evaluation of left shoulder pain.  He reports initially injuring his shoulder about 3 weeks ago when he hit it on a wall while chasing his son.  He was evaluated in urgent care and was placed in a sling with recommended orthopedic follow-up.  X-rays at the time showed a nondisplaced fracture of the greater tuberosity.  He reports that he began doing home exercises that he had done for previous right shoulder injury and was only wearing the sling at night.  He works at the [employer] and has been lifting heavy boxes.  3 days ago he had sudden increased pain in the middle of the night.  Range of motion is significantly limited and then he is unable to lift his left arm.  X-rays of his shoulder showed increased displacement of fracture.  He has been wearing the sling and is taking ibuprofen as needed.  Denies any numbness or tingling.     Surgical History:   None   PMH/PSH/Family History/Social History/Meds/Allergies:         Past Medical History:  Diagnosis Date   Constitutional growth delay     Goiter     Physical growth delay     Poor appetite     Puberty delay     Seizures (HCC)               Past Surgical History:  Procedure Laterality Date   HAND SURGERY Right      has screw in the right wrist/thumb area   right ear surgery            Social History         Socioeconomic History   Marital status: Single      Spouse name: Not on file   Number of children: 1   Years of education: Not on file   Highest education level: Not on file  Occupational History   Not on file  Tobacco Use   Smoking status: Former      Types: Cigars, E-cigarettes    Smokeless tobacco: Never   Tobacco comments:      quit 2 months ago  Vaping Use   Vaping status: Every Day   Substances: Nicotine, Flavoring  Substance and Sexual Activity   Alcohol use: Yes      Comment: on the weekends sometimes. 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[employer]              Hobbies: none    Social Determinants of Health    Financial Resource Strain: Not on file  Food Insecurity: Not on file  Transportation Needs: Not on file  Physical Activity: Not on file  Stress: Not on file  Social Connections: Not on file         Family History  Problem Relation Age of Onset   Thyroid disease Mother  Post-traumatic stress disorder Father     Stomach cancer Other     Cancer Maternal Great-grandmother          not sure of the type        Allergies       Allergies  Allergen Reactions   Amoxicillin Other (See Comments)      Did it involve swelling of the face/tongue/throat, SOB, or low BP? Unknown Did it involve sudden or severe rash/hives, skin peeling, or any reaction on the inside of your mouth or nose? Unknown Did you need to seek medical attention at a hospital or doctor's office? Unknown When did it last happen?  pt was a child     If all above answers are "NO", may proceed with cephalosporin use.              Current Outpatient Medications  Medication Sig Dispense Refill   levETIRAcetam (KEPPRA) 750 MG tablet Take 1 tablet (750 mg total) by mouth daily. 90 tablet 1      No current facility-administered medications for this visit.             Facility-Administered Medications Ordered in Other Visits  Medication Dose Route Frequency Provider Last Rate Last Admin   gadobenate dimeglumine (MULTIHANCE) injection 12 mL  12 mL Intravenous Once  PRN Dohmeier, Porfirio Mylar, MD          Imaging Results (Last 48 hours)  No results found.     Review of Systems:   A ROS was performed including pertinent positives and negatives as documented in the HPI.   Physical Exam :   Constitutional: NAD and appears stated age Neurological: Alert and oriented Psych: Appropriate affect and cooperative There were no vitals taken for this visit.    Comprehensive Musculoskeletal Exam:     Patient's left arm is immobilized in a sling.  Tenderness over the anterior glenohumeral joint and lateral deltoid.  Extremely limited forward elevation of the shoulder in the sling.  Radial pulse 2+.  Grip strength 5/5 bilaterally.   Imaging:   Xray review from urgent care on 09/30/2023 (left shoulder 3 views): Displaced greater tuberosity fracture.  Fragment is now positioned immediately superior to the humeral head.     I personally reviewed and interpreted the radiographs.     Assessment:   27 y.o. male with a displaced proximal left humerus fracture of the greater tuberosity.  He denies any repeat injury however I explained that continued use of the shoulder likely caused the rotator cuff tendons to avulse the fracture.  At this time I did describe that given his loss of active overhead motion I would recommend surgical fixation with a mini open deltoid approach with greater tuberosity fixation with suture anchors.  Will plan to proceed with this today.  I did describe all the risks and limitations.   Plan :     -Plan for mini open left greater tuberosity open reduction internal fixation   After a lengthy discussion of treatment options, including risks, benefits, alternatives, complications of surgical and nonsurgical conservative options, the patient elected surgical repair.   The patient  is aware of the material risks  and complications including, but not limited to injury to adjacent structures, neurovascular injury, infection, numbness, bleeding,  implant failure, thermal burns, stiffness, persistent pain, failure to heal, disease transmission from allograft, need for further surgery, dislocation, anesthetic risks, blood clots, risks of death,and others. The probabilities of surgical success and failure discussed with patient given their  particular co-morbidities.The time and nature of expected rehabilitation and recovery was discussed.The patient's questions were all answered preoperatively.  No barriers to understanding were noted. I explained the natural history of the disease process and Rx rationale.  I explained to the patient what I considered to be reasonable expectations given their personal situation.  The final treatment plan was arrived at through a shared patient decision making process model.          I personally saw and evaluated the patient, and participated in the management and treatment plan.

## 2023-10-06 ENCOUNTER — Encounter (HOSPITAL_COMMUNITY): Payer: Self-pay | Admitting: Orthopaedic Surgery

## 2023-10-06 ENCOUNTER — Other Ambulatory Visit: Payer: Self-pay

## 2023-10-06 NOTE — Progress Notes (Signed)
PCP - Eden Emms, NP  Cardiologist -   PPM/ICD - denies Device Orders - n/a Rep Notified - n/a  Chest x-ray - denies EKG - denies Stress Test - denies ECHO - denies Cardiac Cath - denies  Sleep Study- 01-26-20 done to see why seizures may be happening per patient.  Nothing was found per patient.  Patient is taking keppra for last few years reports no seizures since being on medication. CPAP - denies  DM- Denies  Blood Thinner Instructions: denies Aspirin Instructions: to start after procedure  ERAS Protcol - Clear liquids until 1200 PM  COVID TEST- n/a  Anesthesia review: no  Patient verbally denies any shortness of breath, fever, cough and chest pain during phone call   -------------  SDW INSTRUCTIONS given:  Your procedure is scheduled on October 09, 2023.  Report to [facility] Main Entrance "A" at 12:30 P.M., and check in at the Admitting office.  Call this number if you have problems the morning of surgery:  [phone]   Remember:  Do not eat after midnight the night before your surgery  You may drink clear liquids until 12:00 P.M the morning of your surgery.   Clear liquids allowed are: Water, Non-Citrus Juices (without pulp), Carbonated Beverages, Clear Tea, Black Coffee Only, and Gatorade    Take these medicines the morning of surgery with A SIP OF WATER  levETIRAcetam (KEPPRA)  oxyCODONE (ROXICODONE)   IF NEEDED acetaminophen (TYLENOL)   As of today, STOP taking any Aspirin (unless otherwise instructed by your surgeon) Aleve, Naproxen, Ibuprofen, Motrin, Advil, Goody's, BC's, all herbal medications, fish oil, and all vitamins.                      Do not wear jewelry, make up, or nail polish            Do not wear lotions, powders, perfumes/colognes, or deodorant.            Do not shave 48 hours prior to surgery.  Men may shave face and neck.            Do not bring valuables to the hospital.            [facility] is not responsible for any  belongings or valuables.  Do NOT Smoke (Tobacco/Vaping) 24 hours prior to your procedure If you use a CPAP at night, you may bring all equipment for your overnight stay.   Contacts, glasses, dentures or bridgework may not be worn into surgery.      For patients admitted to the hospital, discharge time will be determined by your treatment team.   Patients discharged the day of surgery will not be allowed to drive home, and someone needs to stay with them for 24 hours.    Special instructions:   [facility]- Preparing For Surgery  Before surgery, you can play an important role. Because skin is not sterile, your skin needs to be as free of germs as possible. You can reduce the number of germs on your skin by washing with CHG (chlorahexidine gluconate) Soap before surgery.  CHG is an antiseptic cleaner which kills germs and bonds with the skin to continue killing germs even after washing.    Oral Hygiene is also important to reduce your risk of infection.  Remember - BRUSH YOUR TEETH THE MORNING OF SURGERY WITH YOUR REGULAR TOOTHPASTE  Please do not use if you have an allergy to CHG or antibacterial soaps.  If your skin becomes reddened/irritated stop using the CHG.  Do not shave (including legs and underarms) for at least 48 hours prior to first CHG shower. It is OK to shave your face.  Please follow these instructions carefully.   Shower the NIGHT BEFORE SURGERY and the MORNING OF SURGERY with DIAL Soap.   Pat yourself dry with a CLEAN TOWEL.  Wear CLEAN PAJAMAS to bed the night before surgery  Place CLEAN SHEETS on your bed the night of your first shower and DO NOT SLEEP WITH PETS.   Day of Surgery: Please shower morning of surgery  Wear Clean/Comfortable clothing the morning of surgery Do not apply any deodorants/lotions.   Remember to brush your teeth WITH YOUR REGULAR TOOTHPASTE.   Questions were answered. Patient verbalized understanding of instructions.

## 2023-10-08 NOTE — Plan of Care (Signed)
[facility] Tonsillectomy/Adenoidectomy, Postoperative PEDS care plan entered in error.

## 2023-10-09 ENCOUNTER — Encounter (HOSPITAL_COMMUNITY)
Admission: RE | Disposition: A | Discharge status: Home / Self Care | Payer: Self-pay | Source: Home / Self Care | Attending: Orthopaedic Surgery

## 2023-10-09 ENCOUNTER — Ambulatory Visit (HOSPITAL_COMMUNITY): Payer: Managed Care, Other (non HMO) | Admitting: Anesthesiology

## 2023-10-09 ENCOUNTER — Other Ambulatory Visit: Payer: Self-pay

## 2023-10-09 ENCOUNTER — Encounter (HOSPITAL_COMMUNITY): Payer: Self-pay | Admitting: Orthopaedic Surgery

## 2023-10-09 ENCOUNTER — Ambulatory Visit (HOSPITAL_COMMUNITY)
Admission: RE | Admit: 2023-10-09 | Discharge: 2023-10-09 | Disposition: A | Discharge status: Home / Self Care | Payer: Managed Care, Other (non HMO) | Attending: Orthopaedic Surgery | Admitting: Orthopaedic Surgery

## 2023-10-09 ENCOUNTER — Ambulatory Visit (HOSPITAL_COMMUNITY): Payer: Managed Care, Other (non HMO)

## 2023-10-09 ENCOUNTER — Encounter: Payer: Self-pay | Admitting: Surgical

## 2023-10-09 DIAGNOSIS — W2201XA Walked into wall, initial encounter: Secondary | ICD-10-CM | POA: Insufficient documentation

## 2023-10-09 DIAGNOSIS — S42252A Displaced fracture of greater tuberosity of left humerus, initial encounter for closed fracture: Secondary | ICD-10-CM

## 2023-10-09 DIAGNOSIS — R569 Unspecified convulsions: Secondary | ICD-10-CM | POA: Insufficient documentation

## 2023-10-09 DIAGNOSIS — Z87891 Personal history of nicotine dependence: Secondary | ICD-10-CM | POA: Diagnosis not present

## 2023-10-09 DIAGNOSIS — Z79899 Other long term (current) drug therapy: Secondary | ICD-10-CM | POA: Insufficient documentation

## 2023-10-09 HISTORY — PX: ORIF HUMERUS FRACTURE: SHX2126

## 2023-10-09 SURGERY — OPEN REDUCTION INTERNAL FIXATION (ORIF) PROXIMAL HUMERUS FRACTURE
Anesthesia: General | Site: Shoulder | Laterality: Left

## 2023-10-09 MED ORDER — PROPOFOL 10 MG/ML IV BOLUS
INTRAVENOUS | Status: AC
  Filled 2023-10-09: qty 20

## 2023-10-09 MED ORDER — CEFAZOLIN SODIUM 1 G IJ SOLR
INTRAMUSCULAR | Status: AC
  Filled 2023-10-09: qty 20

## 2023-10-09 MED ORDER — FENTANYL CITRATE (PF) 250 MCG/5ML IJ SOLN
INTRAMUSCULAR | Status: DC | PRN
  Administered 2023-10-09: 100 ug via INTRAVENOUS
  Administered 2023-10-09 (×2): 50 ug via INTRAVENOUS

## 2023-10-09 MED ORDER — LACTATED RINGERS IV SOLN: INTRAVENOUS | Status: DC

## 2023-10-09 MED ORDER — HYDROMORPHONE HCL 1 MG/ML IJ SOLN
0.2500 mg | INTRAMUSCULAR | Status: DC | PRN
Start: 2023-10-09 — End: 2023-10-10

## 2023-10-09 MED ORDER — KETOROLAC TROMETHAMINE 30 MG/ML IJ SOLN
INTRAMUSCULAR | Status: DC | PRN
  Administered 2023-10-09: 30 mg via INTRAVENOUS

## 2023-10-09 MED ORDER — CEFAZOLIN SODIUM-DEXTROSE 2-3 GM-%(50ML) IV SOLR
INTRAVENOUS | Status: DC | PRN
  Administered 2023-10-09: 2 g via INTRAVENOUS

## 2023-10-09 MED ORDER — FENTANYL CITRATE (PF) 250 MCG/5ML IJ SOLN
INTRAMUSCULAR | Status: AC
  Filled 2023-10-09: qty 5

## 2023-10-09 MED ORDER — MIDAZOLAM HCL 2 MG/2ML IJ SOLN
INTRAMUSCULAR | Status: AC
  Administered 2023-10-09: 2 mg
  Filled 2023-10-09: qty 2

## 2023-10-09 MED ORDER — ROCURONIUM BROMIDE 10 MG/ML (PF) SYRINGE
PREFILLED_SYRINGE | INTRAVENOUS | Status: AC
  Filled 2023-10-09: qty 10

## 2023-10-09 MED ORDER — LIDOCAINE 2% (20 MG/ML) 5 ML SYRINGE
INTRAMUSCULAR | Status: DC | PRN
  Administered 2023-10-09: 60 mg via INTRAVENOUS

## 2023-10-09 MED ORDER — 0.9 % SODIUM CHLORIDE (POUR BTL) OPTIME
TOPICAL | Status: DC | PRN
  Administered 2023-10-09: 1000 mL

## 2023-10-09 MED ORDER — DEXAMETHASONE SODIUM PHOSPHATE 10 MG/ML IJ SOLN
INTRAMUSCULAR | Status: AC
  Filled 2023-10-09: qty 1

## 2023-10-09 MED ORDER — ROCURONIUM BROMIDE 10 MG/ML (PF) SYRINGE
PREFILLED_SYRINGE | INTRAVENOUS | Status: DC | PRN
  Administered 2023-10-09: 20 mg via INTRAVENOUS
  Administered 2023-10-09: 50 mg via INTRAVENOUS

## 2023-10-09 MED ORDER — ORAL CARE MOUTH RINSE: 15.0000 mL | Freq: Once | OROMUCOSAL | Status: AC

## 2023-10-09 MED ORDER — OXYCODONE HCL 5 MG/5ML PO SOLN: 5.0000 mg | Freq: Once | ORAL | Status: DC | PRN

## 2023-10-09 MED ORDER — DEXAMETHASONE SODIUM PHOSPHATE 10 MG/ML IJ SOLN
INTRAMUSCULAR | Status: DC | PRN
  Administered 2023-10-09: 5 mg via INTRAVENOUS

## 2023-10-09 MED ORDER — PROPOFOL 10 MG/ML IV BOLUS
INTRAVENOUS | Status: DC | PRN
  Administered 2023-10-09: 140 mg via INTRAVENOUS

## 2023-10-09 MED ORDER — OXYCODONE HCL 5 MG PO TABS: 5.0000 mg | ORAL_TABLET | Freq: Once | ORAL | Status: DC | PRN

## 2023-10-09 MED ORDER — MIDAZOLAM HCL 2 MG/2ML IJ SOLN
INTRAMUSCULAR | Status: AC
  Filled 2023-10-09: qty 2

## 2023-10-09 MED ORDER — ACETAMINOPHEN 10 MG/ML IV SOLN
INTRAVENOUS | Status: DC | PRN
  Administered 2023-10-09: 1000 mg via INTRAVENOUS

## 2023-10-09 MED ORDER — PHENYLEPHRINE 80 MCG/ML (10ML) SYRINGE FOR IV PUSH (FOR BLOOD PRESSURE SUPPORT)
PREFILLED_SYRINGE | INTRAVENOUS | Status: AC
  Filled 2023-10-09: qty 10

## 2023-10-09 MED ORDER — ONDANSETRON HCL 4 MG/2ML IJ SOLN
INTRAMUSCULAR | Status: AC
  Filled 2023-10-09: qty 2

## 2023-10-09 MED ORDER — FENTANYL CITRATE (PF) 100 MCG/2ML IJ SOLN
INTRAMUSCULAR | Status: AC
  Filled 2023-10-09: qty 2

## 2023-10-09 MED ORDER — CHLORHEXIDINE GLUCONATE 0.12 % MT SOLN
OROMUCOSAL | Status: AC
  Administered 2023-10-09: 15 mL via OROMUCOSAL
  Filled 2023-10-09: qty 15

## 2023-10-09 MED ORDER — LIDOCAINE 2% (20 MG/ML) 5 ML SYRINGE
INTRAMUSCULAR | Status: AC
  Filled 2023-10-09: qty 5

## 2023-10-09 MED ORDER — BUPIVACAINE HCL (PF) 0.5 % IJ SOLN
INTRAMUSCULAR | Status: DC | PRN
  Administered 2023-10-09: 20 mL via PERINEURAL

## 2023-10-09 MED ORDER — KETAMINE HCL 50 MG/5ML IJ SOSY
PREFILLED_SYRINGE | INTRAMUSCULAR | Status: AC
  Filled 2023-10-09: qty 5

## 2023-10-09 MED ORDER — KETOROLAC TROMETHAMINE 30 MG/ML IJ SOLN
INTRAMUSCULAR | Status: AC
  Filled 2023-10-09: qty 1

## 2023-10-09 MED ORDER — BUPIVACAINE LIPOSOME 1.3 % IJ SUSP
INTRAMUSCULAR | Status: DC | PRN
  Administered 2023-10-09: 10 mL via PERINEURAL

## 2023-10-09 MED ORDER — ACETAMINOPHEN 10 MG/ML IV SOLN
INTRAVENOUS | Status: AC
  Filled 2023-10-09: qty 100

## 2023-10-09 MED ORDER — SUGAMMADEX SODIUM 200 MG/2ML IV SOLN
INTRAVENOUS | Status: DC | PRN
  Administered 2023-10-09: 120 mg via INTRAVENOUS

## 2023-10-09 MED ORDER — VANCOMYCIN HCL 1000 MG IV SOLR
INTRAVENOUS | Status: AC
  Filled 2023-10-09: qty 20

## 2023-10-09 MED ORDER — ONDANSETRON HCL 4 MG/2ML IJ SOLN: 4.0000 mg | Freq: Once | INTRAMUSCULAR | Status: DC | PRN

## 2023-10-09 MED ORDER — ONDANSETRON HCL 4 MG/2ML IJ SOLN
INTRAMUSCULAR | Status: DC | PRN
  Administered 2023-10-09: 4 mg via INTRAVENOUS

## 2023-10-09 MED ORDER — FENTANYL CITRATE (PF) 100 MCG/2ML IJ SOLN
INTRAMUSCULAR | Status: AC
  Administered 2023-10-09: 50 ug
  Filled 2023-10-09: qty 2

## 2023-10-09 MED ORDER — CHLORHEXIDINE GLUCONATE 0.12 % MT SOLN
15.0000 mL | Freq: Once | OROMUCOSAL | Status: AC
Start: 2023-10-09 — End: 2023-10-09

## 2023-10-09 SURGICAL SUPPLY — 55 items
AID PSTN UNV HD RSTRNT DISP (MISCELLANEOUS) ×1
ANCH SUT KNTLS STRL SHLDR SYS (Anchor) ×2 IMPLANT
ANCHOR JUGGERKNOT SOFT 1.5 (Anchor) ×2 IMPLANT
ANCHOR JUGGERKNOT SOFT 2.9 (Anchor) IMPLANT
ANCHOR SUT QUATTRO KNTLS 4.5 (Anchor) IMPLANT
APL PRP STRL LF DISP 70% ISPRP (MISCELLANEOUS) ×1
BAG COUNTER SPONGE SURGICOUNT (BAG) ×1 IMPLANT
BAG SPNG CNTER NS LX DISP (BAG)
BNDG ELASTIC 6X5.8 VLCR STR LF (GAUZE/BANDAGES/DRESSINGS) ×1 IMPLANT
BRUSH SCRUB EZ PLAIN DRY (MISCELLANEOUS) ×2 IMPLANT
CHLORAPREP W/TINT 26 (MISCELLANEOUS) ×1 IMPLANT
COOLER ICEMAN CLASSIC (MISCELLANEOUS) IMPLANT
COVER SURGICAL LIGHT HANDLE (MISCELLANEOUS) ×2 IMPLANT
DRAPE C-ARM 42X72 X-RAY (DRAPES) ×1 IMPLANT
DRAPE HALF SHEET 40X57 (DRAPES) ×1 IMPLANT
DRAPE U-SHAPE 47X51 STRL (DRAPES) ×2 IMPLANT
ELECT REM PT RETURN 9FT ADLT (ELECTROSURGICAL) ×1
ELECTRODE REM PT RTRN 9FT ADLT (ELECTROSURGICAL) ×1 IMPLANT
GAUZE PAD ABD 8X10 STRL (GAUZE/BANDAGES/DRESSINGS) IMPLANT
GAUZE SPONGE 4X4 12PLY STRL (GAUZE/BANDAGES/DRESSINGS) IMPLANT
GAUZE XEROFORM 1X8 LF (GAUZE/BANDAGES/DRESSINGS) IMPLANT
GLOVE BIO SURGEON STRL SZ 6.5 (GLOVE) ×3 IMPLANT
GLOVE BIO SURGEON STRL SZ7.5 (GLOVE) ×3 IMPLANT
GLOVE BIOGEL PI IND STRL 6.5 (GLOVE) ×1 IMPLANT
GLOVE BIOGEL PI IND STRL 7.5 (GLOVE) ×1 IMPLANT
GOWN STRL REUS W/ TWL LRG LVL3 (GOWN DISPOSABLE) ×2 IMPLANT
GOWN STRL REUS W/TWL LRG LVL3 (GOWN DISPOSABLE) ×2
KIT BASIN OR (CUSTOM PROCEDURE TRAY) ×1 IMPLANT
KIT TURNOVER KIT B (KITS) ×1 IMPLANT
MANIFOLD NEPTUNE II (INSTRUMENTS) ×1 IMPLANT
NDL MAYO CATGUT SZ4 TPR NDL (NEEDLE) IMPLANT
NDL SCORPION MULTI FIRE (NEEDLE) IMPLANT
NEEDLE MAYO CATGUT SZ4 (NEEDLE) ×1 IMPLANT
NEEDLE SCORPION MULTI FIRE (NEEDLE) ×1 IMPLANT
NS IRRIG 1000ML POUR BTL (IV SOLUTION) ×1 IMPLANT
PACK SHOULDER (CUSTOM PROCEDURE TRAY) ×1 IMPLANT
PAD ARMBOARD 7.5X6 YLW CONV (MISCELLANEOUS) ×2 IMPLANT
PAD COLD SHLDR WRAP-ON (PAD) IMPLANT
RESTRAINT HEAD UNIVERSAL NS (MISCELLANEOUS) IMPLANT
SPONGE T-LAP 18X18 ~~LOC~~+RFID (SPONGE) ×1 IMPLANT
STAPLER VISISTAT 35W (STAPLE) ×1 IMPLANT
SUCTION TUBE FRAZIER 10FR DISP (SUCTIONS) ×1 IMPLANT
SUT BROADBAND TAPE 2PK 2.3 (SUTURE) IMPLANT
SUT ETHILON 3 0 FSL (SUTURE) IMPLANT
SUT ETHILON 3 0 PS 1 (SUTURE) IMPLANT
SUT FIBERWIRE #2 38 T-5 BLUE (SUTURE)
SUT MNCRL AB 3-0 PS2 18 (SUTURE) ×1 IMPLANT
SUT VIC AB 0 CT1 27 (SUTURE) ×1
SUT VIC AB 0 CT1 27XBRD ANBCTR (SUTURE) ×2 IMPLANT
SUT VIC AB 2-0 CT1 27 (SUTURE) ×1
SUT VIC AB 2-0 CT1 TAPERPNT 27 (SUTURE) ×2 IMPLANT
SUTURE FIBERWR #2 38 T-5 BLUE (SUTURE) ×2 IMPLANT
TAPE CLOTH SOFT 2X10 (GAUZE/BANDAGES/DRESSINGS) IMPLANT
TOWEL GREEN STERILE (TOWEL DISPOSABLE) ×2 IMPLANT
WATER STERILE IRR 1000ML POUR (IV SOLUTION) ×1 IMPLANT

## 2023-10-09 NOTE — Brief Op Note (Signed)
   Brief Op Note  Date of Surgery: 10/09/2023  Preoperative Diagnosis: LEFT GREATER TUBEROSITY AVULSION FRACTURE  Postoperative Diagnosis: same  Procedure: Procedure(s): LEFT OPEN REDUCTION INTERNAL FIXATION (ORIF) PROXIMAL HUMERUS FRACTURE  Implants: Implant Name Type Inv. Item Serial No. Manufacturer Lot No. LRB No. Used Action  ANCH SUT KNTLS STRL SHLDR SYS - [id number] Anchor ANCH SUT KNTLS STRL SHLDR SYS  [employer](ORTH,TRAU,BIO,SG) [id number] Left 1 Implanted  ANCHOR JUGGERKNOT SOFT 1.5 - [id number] Anchor ANCHOR JUGGERKNOT SOFT 1.5  [employer](ORTH,TRAU,BIO,SG) [id number] Left 2 Implanted  [facility] SUT KNTLS STRL SHLDR SYS - [id number] Anchor ANCH SUT KNTLS STRL SHLDR SYS  [employer](ORTH,TRAU,BIO,SG) [id number] Left 1 Implanted    Surgeons: Surgeon(s): Huel Cote, MD  Anesthesia: General    Estimated Blood Loss: See anesthesia record  Complications: None  Condition to PACU: Stable  Benancio Deeds, MD 10/09/2023 6:19 PM

## 2023-10-09 NOTE — Anesthesia Procedure Notes (Signed)
Procedure Name: Intubation Date/Time: 10/09/2023 4:30 PM  Performed by: Thomasene Ripple, CRNAPre-anesthesia Checklist: Patient identified, Emergency Drugs available, Suction available and Patient being monitored Patient Re-evaluated:Patient Re-evaluated prior to induction Oxygen Delivery Method: Circle System Utilized Preoxygenation: Pre-oxygenation with 100% oxygen Induction Type: IV induction Ventilation: Mask ventilation without difficulty Tube type: Oral Tube size: 7.5 mm Number of attempts: 1 Airway Equipment and Method: Stylet and Oral airway Placement Confirmation: ETT inserted through vocal cords under direct vision, positive ETCO2 and breath sounds checked- equal and bilateral Tube secured with: Tape Dental Injury: Teeth and Oropharynx as per pre-operative assessment

## 2023-10-09 NOTE — Anesthesia Procedure Notes (Signed)
Anesthesia Regional Block: Interscalene brachial plexus block   Pre-Anesthetic Checklist: , timeout performed,  Correct Patient, Correct Site, Correct Laterality,  Correct Procedure, Correct Position, site marked,  Risks and benefits discussed,  Surgical consent,  Pre-op evaluation,  At surgeon's request and post-op pain management  Laterality: Left  Prep: chloraprep       Needles:  Injection technique: Single-shot  Needle Type: Echogenic Stimulator Needle     Needle Length: 9cm  Needle Gauge: 22     Additional Needles:   Procedures:,,,, ultrasound used (permanent image in chart),,    Narrative:  Start time: 10/09/2023 3:25 PM End time: 10/09/2023 3:32 PM Injection made incrementally with aspirations every 5 mL.  Performed by: Personally  Anesthesiologist: Collene Schlichter, MD  Additional Notes: Functioning IV was confirmed and monitors were applied.  A 90mm 22ga echogenic stimulator needle was used. Sterile prep and drape, hand hygiene, and sterile gloves were used.  Negative aspiration and negative test dose prior to incremental administration of local anesthetic. The patient tolerated the procedure well.  Ultrasound guidance: relevent anatomy identified, needle position confirmed, local anesthetic spread visualized around nerve(s), vascular puncture avoided.  Image printed for medical record.

## 2023-10-09 NOTE — Anesthesia Postprocedure Evaluation (Signed)
Anesthesia Post Note  Patient: Howard Pena  Procedure(s) Performed: LEFT OPEN REDUCTION INTERNAL FIXATION (ORIF) PROXIMAL HUMERUS FRACTURE (Left: Shoulder)     Patient location during evaluation: PACU Anesthesia Type: General and Regional Level of consciousness: awake and alert Pain management: pain level controlled Vital Signs Assessment: post-procedure vital signs reviewed and stable Respiratory status: spontaneous breathing, nonlabored ventilation, respiratory function stable and patient connected to nasal cannula oxygen Cardiovascular status: blood pressure returned to baseline and stable Postop Assessment: no apparent nausea or vomiting Anesthetic complications: no  No notable events documented.  Last Vitals:  Vitals:   10/09/23 1908 10/09/23 1923  BP: (!) 135/97 (!) 143/94  Pulse: (!) 103 95  Resp: (!) 21 20  Temp:  (!) 36.1 C  SpO2: 95% 100%    Last Pain:  Vitals:   10/09/23 1923  TempSrc:   PainSc: 0-No pain   Pain Goal:                   Howard Pena

## 2023-10-09 NOTE — Discharge Instructions (Signed)
     Discharge Instructions    Attending Surgeon: Huel Cote, MD Office Phone Number: [phone]   Diagnosis and Procedures:    Surgeries Performed: Left greater tuberosity repair  Discharge Plan:    Diet: Resume usual diet. Begin with light or bland foods.  Drink plenty of fluids.  Activity:  Keep sling and dressing in place until your follow up visit in Physical Therapy You are advised to go home directly from the hospital or surgical center. Restrict your activities.  GENERAL INSTRUCTIONS: 1.  Keep your surgical site elevated above your heart for at least 5-7 days or longer to prevent swelling. This will improve your comfort and your overall recovery following surgery.     2. Please call Dr. Serena Croissant office at [phone] with questions Monday-Friday during business hours. If no one answers, please leave a message and someone should get back to the patient within 24 hours. For emergencies please call 911 or proceed to the emergency room.   3. Patient to notify surgical team if experiences any of the following: Bowel/Bladder dysfunction, uncontrolled pain, nerve/muscle weakness, incision with increased drainage or redness, nausea/vomiting and Fever greater than 101.0 F.  Be alert for signs of infection including redness, streaking, odor, fever or chills. Be alert for excessive pain or bleeding and notify your surgeon immediately.  WOUND INSTRUCTIONS:   Leave your dressing/cast/splint in place until your post operative visit.  Keep it clean and dry.  Always keep the incision clean and dry until the staples/sutures are removed. If there is no drainage from the incision you should keep it open to air. If there is drainage from the incision you must keep it covered at all times until the drainage stops  Do not soak in a bath tub, hot tub, pool, lake or other body of water until 21 days after your surgery and your incision is completely dry and healed.  If you have  removable sutures (or staples) they must be removed 10-14 days (unless otherwise instructed) from the day of your surgery.     1)  Elevate the extremity as much as possible.  2)  Keep the dressing clean and dry.  3)  Please call us if the dressing becomes wet or dirty.  4)  If you are experiencing worsening pain or worsening swelling, please call.     MEDICATIONS: Resume all previous home medications at the previous prescribed dose and frequency unless otherwise noted Start taking the  pain medications on an as-needed basis as prescribed  Please taper down pain medication over the next week following surgery.  Ideally you should not require a refill of any narcotic pain medication.  Take pain medication with food to minimize nausea. In addition to the prescribed pain medication, you may take over-the-counter pain relievers such as Tylenol.  Do NOT take additional tylenol if your pain medication already has tylenol in it.  Aspirin 325mg  daily for four weeks.      FOLLOWUP INSTRUCTIONS: 1. Follow up at the Physical Therapy Clinic 3-4 days following surgery. This appointment should be scheduled unless other arrangements have been made.The Physical Therapy scheduling number is [phone] if an appointment has not already been arranged.  2. Contact Dr. Serena Croissant office during office hours at [phone] or the practice after hours line at [phone] for non-emergencies. For medical emergencies call 911.   Discharge Location: Home

## 2023-10-09 NOTE — Interval H&P Note (Signed)
History and Physical Interval Note:  10/09/2023 1:59 PM  Howard Pena  has presented today for surgery, with the diagnosis of LEFT GREATER TUBEROSITY AVULSION FRACTURE.  The various methods of treatment have been discussed with the patient and family. After consideration of risks, benefits and other options for treatment, the patient has consented to  Procedure(s): LEFT OPEN REDUCTION INTERNAL FIXATION (ORIF) PROXIMAL HUMERUS FRACTURE (Left) as a surgical intervention.  The patient's history has been reviewed, patient examined, no change in status, stable for surgery.  I have reviewed the patient's chart and labs.  Questions were answered to the patient's satisfaction.     Huel Cote

## 2023-10-09 NOTE — Anesthesia Preprocedure Evaluation (Signed)
Anesthesia Evaluation  Patient identified by MRN, date of birth, ID band Patient awake    Reviewed: Allergy & Precautions, NPO status , Patient's Chart, lab work & pertinent test results, reviewed documented beta blocker date and time   Airway Mallampati: I  TM Distance: >3 FB Neck ROM: Full    Dental no notable dental hx. (+) Teeth Intact, Dental Advisory Given   Pulmonary neg pulmonary ROS, former smoker   Pulmonary exam normal breath sounds clear to auscultation       Cardiovascular negative cardio ROS  Rhythm:Regular Rate:Normal     Neuro/Psych Seizures -, Well Controlled,   negative psych ROS   GI/Hepatic negative GI ROS, Neg liver ROS,,,  Endo/Other  negative endocrine ROS    Renal/GU negative Renal ROS  negative genitourinary   Musculoskeletal Fx left proximal humerus   Abdominal   Peds  Hematology negative hematology ROS (+)   Anesthesia Other Findings   Reproductive/Obstetrics                              Anesthesia Physical Anesthesia Plan  ASA: 2  Anesthesia Plan: General   Post-op Pain Management: Regional block* and Minimal or no pain anticipated   Induction: Intravenous  PONV Risk Score and Plan: 3 and Treatment may vary due to age or medical condition, Midazolam, Ondansetron and Dexamethasone  Airway Management Planned: Oral ETT  Additional Equipment: None  Intra-op Plan:   Post-operative Plan: Extubation in OR  Informed Consent: I have reviewed the patients History and Physical, chart, labs and discussed the procedure including the risks, benefits and alternatives for the proposed anesthesia with the patient or authorized representative who has indicated his/her understanding and acceptance.       Plan Discussed with: CRNA and Anesthesiologist  Anesthesia Plan Comments:          Anesthesia Quick Evaluation

## 2023-10-09 NOTE — Progress Notes (Signed)
Orthopedic Tech Progress Note Patient Details:  Howard Pena Sep 15, 1996 [id number]  Ortho Devices Type of Ortho Device: Other (comment) (Don joy sling) Ortho Device/Splint Interventions: Ordered  Sling delivered to the OR desk per request    Joany Khatib OTR/L 10/09/2023, 5:31 PM

## 2023-10-09 NOTE — Op Note (Signed)
   Date of Surgery: 10/09/2023  INDICATIONS: Howard Pena is a 27 y.o.-year-old male with left greater tuberosity proximal humeral avulsion.  The risk and benefits of the procedure were discussed in detail and documented in the pre-operative evaluation.   PREOPERATIVE DIAGNOSIS: 1.  Left greater tuberosity fracture  POSTOPERATIVE DIAGNOSIS: Same.  PROCEDURE: 1.  Left open reduction internal fixation greater tuberosity fracture  SURGEON: Benancio Deeds MD  ASSISTANT: Kerby Less, ATC  ANESTHESIA:  general plus interscalene nerve block  IV FLUIDS AND URINE: See anesthesia record.  ANTIBIOTICS: Ancef  ESTIMATED BLOOD LOSS: 10 mL.  IMPLANTS:  Implant Name Type Inv. Item Serial No. Manufacturer Lot No. LRB No. Used Action  ANCH SUT KNTLS STRL SHLDR SYS - [id number] Anchor ANCH SUT KNTLS STRL SHLDR SYS  ZIMMER [employer]ON(ORTH,TRAU,BIO,SG) [id number] Left 1 Implanted  ANCHOR JUGGERKNOT SOFT 1.5 - [id number] Anchor ANCHOR JUGGERKNOT SOFT 1.5  ZIMMER [employer]ON(ORTH,TRAU,BIO,SG) [id number] Left 2 Implanted  ANCH SUT KNTLS STRL SHLDR SYS - [id number] Anchor ANCH SUT KNTLS STRL SHLDR SYS  ZIMMER [employer]ON(ORTH,TRAU,BIO,SG) [id number] Left 1 Implanted    DRAINS: None  CULTURES: None  COMPLICATIONS: none  DESCRIPTION OF PROCEDURE:   Patient was identified in the preoperative holding area.  The correct site was marked according to universal protocol he subsequently taken back to the operating room.  Antibiotics were given 1 hour prior to skin incision.  Interscalene nerve block was placed in the preoperative area.  In the OR he was moved over to the OR table.  Anesthesia was induced.  He was placed in the [place] bed and prepped and draped in a beachchair fashion.  Timeout was again held.  I began with a mini open rotator cuff approach.  15 blade was used to incise vertically along the lateral edge of the acromion.  The deltoid was encountered.  Fatty raphae was identified and incised with Bovie  electrocautery.  This was done right off of the border of the acromion with care not to go distal to violate the axillary nerve.  The anterior supraspinatus as well as the fracture avulsion was identified.  This was mobilized using a Allis.  At this time a double row configuration was utilized by placing 2 medial row anchors into the medial bed of the wound.  The posterior sutures were wrapped up and over the greater tuberosity the anterior sutures were mobilized into the anterior border of the supraspinatus.  These were then placed into 2 lateral row suture anchors.  This was done in a double row configuration.  This provided excellent apposition of the greater tuberosity and was stable in all planes of motion.  The wound was thoroughly irrigated.  This was closed in layers with 0 Vicryl 2-0 Vicryl and 3-0 nylon.  All counts correct at the end of the case.  Soft dressing and sling were applied.    POSTOPERATIVE PLAN: He will be nonweightbearing and follow the rotator cuff repair protocol.  I will plan to see him back in 2 weeks in my office for suture removal.  He will be placed on aspirin for blood clot prevention  Benancio Deeds, MD 6:20 PM

## 2023-10-09 NOTE — Interval H&P Note (Signed)
History and Physical Interval Note:  10/09/2023 3:52 PM  Howard Pena  has presented today for surgery, with the diagnosis of LEFT GREATER TUBEROSITY AVULSION FRACTURE.  The various methods of treatment have been discussed with the patient and family. After consideration of risks, benefits and other options for treatment, the patient has consented to  Procedure(s): LEFT OPEN REDUCTION INTERNAL FIXATION (ORIF) PROXIMAL HUMERUS FRACTURE (Left) as a surgical intervention.  The patient's history has been reviewed, patient examined, no change in status, stable for surgery.  I have reviewed the patient's chart and labs.  Questions were answered to the patient's satisfaction.     Huel Cote

## 2023-10-09 NOTE — Transfer of Care (Signed)
Immediate Anesthesia Transfer of Care Note  Patient: Howard Pena  Procedure(s) Performed: LEFT OPEN REDUCTION INTERNAL FIXATION (ORIF) PROXIMAL HUMERUS FRACTURE (Left: Shoulder)  Patient Location: PACU  Anesthesia Type:General and GA combined with regional for post-op pain  Level of Consciousness: awake, alert , oriented, and patient cooperative  Airway & Oxygen Therapy: Patient Spontanous Breathing and Patient connected to face mask oxygen  Post-op Assessment: Report given to RN and Post -op Vital signs reviewed and stable  Post vital signs: Reviewed and stable  Last Vitals:  Vitals Value Taken Time  BP 132/74 10/09/23 1853  Temp 36.1 C 10/09/23 1853  Pulse 77 10/09/23 1853  Resp 26 10/09/23 1856  SpO2 100 % 10/09/23 1853  Vitals shown include unfiled device data.  Last Pain:  Vitals:   10/09/23 1853  TempSrc:   PainSc: Asleep         Complications: No notable events documented.

## 2023-10-10 ENCOUNTER — Encounter (HOSPITAL_COMMUNITY): Payer: Self-pay | Admitting: Orthopaedic Surgery

## 2023-10-11 ENCOUNTER — Other Ambulatory Visit (HOSPITAL_BASED_OUTPATIENT_CLINIC_OR_DEPARTMENT_OTHER): Payer: Self-pay | Admitting: Orthopaedic Surgery

## 2023-10-11 DIAGNOSIS — S42252A Displaced fracture of greater tuberosity of left humerus, initial encounter for closed fracture: Secondary | ICD-10-CM

## 2023-10-13 ENCOUNTER — Ambulatory Visit (HOSPITAL_BASED_OUTPATIENT_CLINIC_OR_DEPARTMENT_OTHER): Payer: Managed Care, Other (non HMO) | Attending: Orthopaedic Surgery | Admitting: Physical Therapy

## 2023-10-13 ENCOUNTER — Encounter (HOSPITAL_BASED_OUTPATIENT_CLINIC_OR_DEPARTMENT_OTHER): Payer: Self-pay | Admitting: Physical Therapy

## 2023-10-13 ENCOUNTER — Other Ambulatory Visit: Payer: Self-pay

## 2023-10-13 DIAGNOSIS — S42252A Displaced fracture of greater tuberosity of left humerus, initial encounter for closed fracture: Secondary | ICD-10-CM

## 2023-10-13 DIAGNOSIS — M25612 Stiffness of left shoulder, not elsewhere classified: Secondary | ICD-10-CM | POA: Diagnosis not present

## 2023-10-13 DIAGNOSIS — W2209XD Striking against other stationary object, subsequent encounter: Secondary | ICD-10-CM | POA: Insufficient documentation

## 2023-10-13 DIAGNOSIS — M25512 Pain in left shoulder: Secondary | ICD-10-CM

## 2023-10-13 DIAGNOSIS — S42252D Displaced fracture of greater tuberosity of left humerus, subsequent encounter for fracture with routine healing: Secondary | ICD-10-CM | POA: Insufficient documentation

## 2023-10-13 DIAGNOSIS — M6281 Muscle weakness (generalized): Secondary | ICD-10-CM | POA: Diagnosis not present

## 2023-10-13 NOTE — Therapy (Signed)
OUTPATIENT PHYSICAL THERAPY SHOULDER EVALUATION   Patient Name: Howard Pena MRN: [id number] DOB:11-Dec-1996, 27 y.o., male Today's Date: 10/13/2023  END OF SESSION:  PT End of Session - 10/13/23 1347     Visit Number 1    Number of Visits 24    Date for PT Re-Evaluation 01/05/24    Authorization Type [employer]    PT Start Time 1348    PT Stop Time 1425    PT Time Calculation (min) 37 min    Activity Tolerance Patient tolerated treatment well    Behavior During Therapy WFL for tasks assessed/performed             Past Medical History:  Diagnosis Date   Constitutional growth delay    Goiter    Physical growth delay    Poor appetite    Puberty delay    Seizures (HCC)    Past Surgical History:  Procedure Laterality Date   HAND SURGERY Right    has screw in the right wrist/thumb area   ORIF HUMERUS FRACTURE Left 10/09/2023   Procedure: LEFT OPEN REDUCTION INTERNAL FIXATION (ORIF) PROXIMAL HUMERUS FRACTURE;  Surgeon: Huel Cote, MD;  Location: [facility] OR;  Service: Orthopedics;  Laterality: Left;   right ear surgery     Patient Active Problem List   Diagnosis Date Noted   Closed displaced fracture of greater tuberosity of left humerus 10/09/2023   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   Frequent bowel movements 11/17/2022   Sleep deprivation seizure (HCC) 04/14/2020   Convulsion (HCC) 03/30/2020   Shifting sleep-work schedule, affecting sleep 02/07/2020   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   Peritonsillar abscess 01/28/2020   Physical growth delay    Constitutional growth delay    Goiter    Poor appetite    Puberty delay    Lack of expected normal physiological development in childhood 04/21/2011   Goiter 04/21/2011   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, not elsewhere classified 04/21/2011    PCP: Mordecai Maes NP  REFERRING PROVIDER: Huel Cote, MD  REFERRING DIAG: S42.252A (ICD-10-CM) - Displaced fracture of greater tuberosity of  left humerus, initial encounter for closed fracture  THERAPY DIAG:  Left shoulder pain, unspecified chronicity  Muscle weakness (generalized)  Stiffness of left shoulder, not elsewhere classified  Displaced fracture of greater tuberosity of left humerus, initial encounter for closed fracture  Rationale for Evaluation and Treatment: Rehabilitation  ONSET DATE: Post op 10/21  SUBJECTIVE:  SUBJECTIVE STATEMENT: Patient states shoulder was really sore from when the nerve block wore off. Been sleeping in recliner. Original fracture occurred when he hit his shoulder over the wall. Then had trouble using his arm. Stayed out of work for a bit and then went back to work and one day when he woke up his fracture was displaced. Has to be able to lift for work, physical job. Hand dominance: Right  PERTINENT HISTORY: s/p Left open reduction internal fixation greater tuberosity fracture  PAIN:  Are you having pain? Yes: NPRS scale: 2-3/10 Pain location: L shoulder Pain description: achy Aggravating factors: pressure, if shoulder gets tapped Relieving factors: meds   PRECAUTIONS: Shoulder and Other: Follow RCR protocol  RED FLAGS: None   WEIGHT BEARING RESTRICTIONS: Yes NWB LUE  FALLS:  Has patient fallen in last 6 months? Yes. Number of falls 5-10  OCCUPATION: [name]   PLOF: Independent  PATIENT GOALS:be able to lift his arm up   OBJECTIVE: (objective measures from initial evaluation unless otherwise dated) Observation: lateral deltoid incision with no signs of infection  PATIENT SURVEYS:  FOTO 28% function  COGNITION: Overall cognitive status: Within functional limits for tasks assessed     SENSATION: WFL  POSTURE: rounded shoulders and forward head   UPPER EXTREMITY  ROM:   Passive ROM Right eval Left eval  Shoulder flexion  90  Shoulder extension    Shoulder abduction  60  Shoulder adduction    Shoulder internal rotation    Shoulder external rotation  neutral  Elbow flexion    Elbow extension    Wrist flexion    Wrist extension    Wrist ulnar deviation    Wrist radial deviation    Wrist pronation    Wrist supination    (Blank rows = not tested) *=pain/symptoms  UPPER EXTREMITY MMT: NT due to post op status  MMT Right eval Left eval  Shoulder flexion    Shoulder extension    Shoulder abduction    Shoulder adduction    Shoulder internal rotation    Shoulder external rotation    Middle trapezius    Lower trapezius    Elbow flexion    Elbow extension    Wrist flexion    Wrist extension    Wrist ulnar deviation    Wrist radial deviation    Wrist pronation    Wrist supination    Grip strength (lbs)    (Blank rows = not tested) *=pain/symptoms   PALPATION:  Grossly TTP in L shoulder   TODAY'S TREATMENT:                                                                                                                                         DATE:  10/13/23 EVAL, Dressing change, protocol/precautions Pendulums x 20 Elbow ROM  x 20 Wrist/hand ROM x20   PATIENT EDUCATION:  Education details:  Patient educated on exam findings, POC, scope of PT, HEP, and protocol/precautions. Person educated: Patient Education method: Explanation, Demonstration, and Handouts Education comprehension: verbalized understanding, returned demonstration, verbal cues required, and tactile cues required  HOME EXERCISE PROGRAM: Access Code: [email or web address] URL: [email or web address]  Date: 10/13/2023 - Circular Shoulder Pendulum with Table Support  - 5 x daily - 7 x weekly - 5 reps - 1 minute hold - Forearm Pronation and Supination With Shoulder Sling (Mirrored)  - 5 x daily - 7 x weekly - 20 reps - Wrist Flexion and Extension With Shoulder  Sling (Mirrored)  - 5 x daily - 7 x weekly - 20 reps - Seated Elbow Flexion Extension AROM  - 5 x daily - 7 x weekly - 3 sets - 10 reps - Ball Squeeze With Shoulder Sling  - 5 x daily - 7 x weekly - 20 reps  ASSESSMENT:  CLINICAL IMPRESSION: Patient a 27 y.o. y.o. male who was seen today for physical therapy evaluation and treatment for Displaced fracture of greater tuberosity of left humerus s/p Left open reduction internal fixation greater tuberosity fracture . Patient presents with pain limited deficits in L shoulder strength, ROM, endurance, activity tolerance, and functional mobility with ADL. Patient is having to modify and restrict ADL as indicated by outcome measure score as well as subjective information and objective measures which is affecting overall participation. Patient will benefit from skilled physical therapy in order to improve function and reduce impairment.  OBJECTIVE IMPAIRMENTS: decreased activity tolerance, decreased balance, decreased endurance, decreased mobility, decreased ROM, decreased strength, hypomobility, increased muscle spasms, impaired flexibility, improper body mechanics, postural dysfunction, and pain.   ACTIVITY LIMITATIONS: carrying, lifting, bending, standing, bathing, toileting, dressing, reach over head, hygiene/grooming, locomotion level, and caring for others  PARTICIPATION LIMITATIONS: meal prep, cleaning, laundry, driving, shopping, community activity, occupation, and yard work  PERSONAL FACTORS: Time since onset of injury/illness/exacerbation and 1 comorbidity: hx seizures  are also affecting patient's functional outcome.   REHAB POTENTIAL: Good  CLINICAL DECISION MAKING: Stable/uncomplicated  EVALUATION COMPLEXITY: Low   GOALS: Goals reviewed with patient? Yes  SHORT TERM GOALS: Target date: 11/24/2023    Patient will be independent with HEP in order to improve functional outcomes. Baseline: Goal status: INITIAL  2.  Patient will  report at least 25% improvement in symptoms for improved quality of life. Baseline:  Goal status: INITIAL  3.  Patient will demonstrate 140 degrees of L shoulder flexion PROM.  Baseline:  Goal status: INITIAL   LONG TERM GOALS: Target date: 01/05/2024    Patient will report at least 75% improvement in symptoms for improved quality of life. Baseline:  Goal status: INITIAL  2.  Patient will improve FOTO score to predicted outcomes in order to indicate improved tolerance to activity. Baseline: 28% function Goal status: INITIAL  3.  Patient will demonstrate at least 160 in shoulder AROM in flexion for improved ability lift overhead. Baseline:  Goal status: INITIAL  4.  Patient will be able to return to all activities unrestricted for improved ability to perform work functions and participate with family.  Baseline:  Goal status: INITIAL  5.  Patient will demonstrate grade of 5/5 MMT grade in all tested musculature as evidence of improved strength to assist with lifting at home. Baseline:  Goal status: INITIAL    PLAN:  PT FREQUENCY: 1-2x/week  PT DURATION: 12 weeks  PLANNED INTERVENTIONS: 97164- PT Re-evaluation, 97110-Therapeutic exercises, 97530- Therapeutic activity, [id number]- Neuromuscular re-education, 97535- Self Care, 16109- Manual therapy,  16109- Gait training, 60454- Orthotic Fit/training, 09811- Canalith repositioning, 91478- Aquatic Therapy, [phone]- Splinting, Patient/Family education, Balance training, Stair training, Taping, Dry Needling, Joint mobilization, Joint manipulation, Spinal manipulation, Spinal mobilization, Scar mobilization, and DME instructions.   PLAN FOR NEXT SESSION: progress with Dr Serena Croissant RCR protocol   Reola Mosher Ashwin Tibbs, PT 10/13/2023, 3:18 PM

## 2023-10-13 NOTE — Plan of Care (Signed)
 [facility] Tonsillectomy/Adenoidectomy, Postoperative PEDS care plan entered in error.

## 2023-10-18 ENCOUNTER — Encounter (HOSPITAL_BASED_OUTPATIENT_CLINIC_OR_DEPARTMENT_OTHER): Payer: Self-pay | Admitting: Physical Therapy

## 2023-10-18 ENCOUNTER — Ambulatory Visit (HOSPITAL_BASED_OUTPATIENT_CLINIC_OR_DEPARTMENT_OTHER): Payer: Managed Care, Other (non HMO) | Admitting: Physical Therapy

## 2023-10-18 DIAGNOSIS — M6281 Muscle weakness (generalized): Secondary | ICD-10-CM

## 2023-10-18 DIAGNOSIS — M25512 Pain in left shoulder: Secondary | ICD-10-CM

## 2023-10-18 DIAGNOSIS — S42252D Displaced fracture of greater tuberosity of left humerus, subsequent encounter for fracture with routine healing: Secondary | ICD-10-CM | POA: Diagnosis not present

## 2023-10-18 DIAGNOSIS — M25612 Stiffness of left shoulder, not elsewhere classified: Secondary | ICD-10-CM

## 2023-10-18 NOTE — Therapy (Signed)
OUTPATIENT PHYSICAL THERAPY SHOULDER EVALUATION   Patient Name: Howard Pena MRN: [id number] DOB:01/14/1996, 26 y.o., male Today's Date: 10/18/2023  END OF SESSION:  PT End of Session - 10/18/23 0841     Visit Number 2    Number of Visits 24    Date for PT Re-Evaluation 01/05/24    Authorization Type [employer]    PT Start Time 0802    PT Stop Time 0841    PT Time Calculation (min) 39 min    Activity Tolerance Patient tolerated treatment well    Behavior During Therapy WFL for tasks assessed/performed              Past Medical History:  Diagnosis Date   Constitutional growth delay    Goiter    Physical growth delay    Poor appetite    Puberty delay    Seizures (HCC)    Past Surgical History:  Procedure Laterality Date   HAND SURGERY Right    has screw in the right wrist/thumb area   ORIF HUMERUS FRACTURE Left 10/09/2023   Procedure: LEFT OPEN REDUCTION INTERNAL FIXATION (ORIF) PROXIMAL HUMERUS FRACTURE;  Surgeon: Huel Cote, MD;  Location: [facility] OR;  Service: Orthopedics;  Laterality: Left;   right ear surgery     Patient Active Problem List   Diagnosis Date Noted   Closed displaced fracture of greater tuberosity of left humerus 10/09/2023   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   Frequent bowel movements 11/17/2022   Sleep deprivation seizure (HCC) 04/14/2020   Convulsion (HCC) 03/30/2020   Shifting sleep-work schedule, affecting sleep 02/07/2020   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   Peritonsillar abscess 01/28/2020   Physical growth delay    Constitutional growth delay    Goiter    Poor appetite    Puberty delay    Lack of expected normal physiological development in childhood 04/21/2011   Goiter 04/21/2011   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, not elsewhere classified 04/21/2011    PCP: Mordecai Maes NP  REFERRING PROVIDER: Huel Cote, MD  REFERRING DIAG: S42.252A (ICD-10-CM) - Displaced fracture of greater tuberosity of  left humerus, initial encounter for closed fracture  THERAPY DIAG:  Left shoulder pain, unspecified chronicity  Muscle weakness (generalized)  Stiffness of left shoulder, not elsewhere classified  Rationale for Evaluation and Treatment: Rehabilitation  ONSET DATE: Post op 10/21  Days since surgery: 9   SUBJECTIVE:                                                                                                                                                                                      SUBJECTIVE STATEMENT:  Pt states  that the shoulder is most achey at night sleep. Has been keeping it in the sling and only removing it and letting it rest on a supported surface for an hour a day. Uses ice packs instead of the iceman due to the heft of the machine and burning feeling.   Eval:  Patient states shoulder was really sore from when the nerve block wore off. Been sleeping in recliner. Original fracture occurred when he hit his shoulder over the wall. Then had trouble using his arm. Stayed out of work for a bit and then went back to work and one day when he woke up his fracture was displaced. Has to be able to lift for work, physical job. Hand dominance: Right  PERTINENT HISTORY: s/p Left open reduction internal fixation greater tuberosity fracture  PAIN:  Are you having pain? Yes: NPRS scale: 5/10 Pain location: L shoulder Pain description: achy Aggravating factors: pressure, if shoulder gets tapped Relieving factors: meds   PRECAUTIONS: Shoulder and Other: Follow RCR protocol  RED FLAGS: None   WEIGHT BEARING RESTRICTIONS: Yes NWB LUE  FALLS:  Has patient fallen in last 6 months? Yes. Number of falls 5-10  OCCUPATION: [name]   PLOF: Independent  PATIENT GOALS:be able to lift his arm up   OBJECTIVE: (objective measures from initial evaluation unless otherwise dated) Observation: lateral deltoid incision with no signs of infection  PATIENT  SURVEYS:  FOTO 28% function  COGNITION: Overall cognitive status: Within functional limits for tasks assessed     SENSATION: WFL  POSTURE: rounded shoulders and forward head   UPPER EXTREMITY ROM:   Passive ROM Right eval Left eval  Shoulder flexion  90  Shoulder extension    Shoulder abduction  60  Shoulder adduction    Shoulder internal rotation    Shoulder external rotation  neutral  Elbow flexion    Elbow extension    Wrist flexion    Wrist extension    Wrist ulnar deviation    Wrist radial deviation    Wrist pronation    Wrist supination    (Blank rows = not tested) *=pain/symptoms  UPPER EXTREMITY MMT: NT due to post op status  MMT Right eval Left eval  Shoulder flexion    Shoulder extension    Shoulder abduction    Shoulder adduction    Shoulder internal rotation    Shoulder external rotation    Middle trapezius    Lower trapezius    Elbow flexion    Elbow extension    Wrist flexion    Wrist extension    Wrist ulnar deviation    Wrist radial deviation    Wrist pronation    Wrist supination    Grip strength (lbs)    (Blank rows = not tested) *=pain/symptoms   PALPATION:  Grossly TTP in L shoulder   TODAY'S TREATMENT:  DATE:  10/30  PROM: flexion to 85, ABD to 60, ER to 5, IR to 30  LAD with oscillation on open packed position  HEP review  Wound inspection and bandage change, review of HEP  10/13/23 EVAL, Dressing change, protocol/precautions Pendulums x 20 Elbow ROM  x 20 Wrist/hand ROM x20   PATIENT EDUCATION:  Education details: Patient educated on exam findings, POC, scope of PT, HEP, and protocol/precautions. Person educated: Patient Education method: Explanation, Demonstration, and Handouts Education comprehension: verbalized understanding, returned demonstration, verbal cues required,  and tactile cues required  HOME EXERCISE PROGRAM: Access Code: [email or web address] URL: [email or web address]  Date: 10/13/2023 - Circular Shoulder Pendulum with Table Support  - 5 x daily - 7 x weekly - 5 reps - 1 minute hold - Forearm Pronation and Supination With Shoulder Sling (Mirrored)  - 5 x daily - 7 x weekly - 20 reps - Wrist Flexion and Extension With Shoulder Sling (Mirrored)  - 5 x daily - 7 x weekly - 20 reps - Seated Elbow Flexion Extension AROM  - 5 x daily - 7 x weekly - 3 sets - 10 reps - Ball Squeeze With Shoulder Sling  - 5 x daily - 7 x weekly - 20 reps  ASSESSMENT:  CLINICAL IMPRESSION: Pt 9 days post op. Pt with clean and dry wound upon inspection, no signs of infection. Pt able to progress PROM as per protocol but is limited by stiffness and pain at endranges listed above. Pt to have f/u with MD Nov 1. Plan to progress per protocol. Stiffness of the joint as expected but pt tolerates PROM well. Patient will benefit from skilled physical therapy in order to improve function and reduce impairment.  OBJECTIVE IMPAIRMENTS: decreased activity tolerance, decreased balance, decreased endurance, decreased mobility, decreased ROM, decreased strength, hypomobility, increased muscle spasms, impaired flexibility, improper body mechanics, postural dysfunction, and pain.   ACTIVITY LIMITATIONS: carrying, lifting, bending, standing, bathing, toileting, dressing, reach over head, hygiene/grooming, locomotion level, and caring for others  PARTICIPATION LIMITATIONS: meal prep, cleaning, laundry, driving, shopping, community activity, occupation, and yard work  PERSONAL FACTORS: Time since onset of injury/illness/exacerbation and 1 comorbidity: hx seizures  are also affecting patient's functional outcome.   REHAB POTENTIAL: Good  CLINICAL DECISION MAKING: Stable/uncomplicated  EVALUATION COMPLEXITY: Low   GOALS: Goals reviewed with patient? Yes  SHORT TERM GOALS: Target  date: 11/24/2023    Patient will be independent with HEP in order to improve functional outcomes. Baseline: Goal status: INITIAL  2.  Patient will report at least 25% improvement in symptoms for improved quality of life. Baseline:  Goal status: INITIAL  3.  Patient will demonstrate 140 degrees of L shoulder flexion PROM.  Baseline:  Goal status: INITIAL   LONG TERM GOALS: Target date: 01/05/2024    Patient will report at least 75% improvement in symptoms for improved quality of life. Baseline:  Goal status: INITIAL  2.  Patient will improve FOTO score to predicted outcomes in order to indicate improved tolerance to activity. Baseline: 28% function Goal status: INITIAL  3.  Patient will demonstrate at least 160 in shoulder AROM in flexion for improved ability lift overhead. Baseline:  Goal status: INITIAL  4.  Patient will be able to return to all activities unrestricted for improved ability to perform work functions and participate with family.  Baseline:  Goal status: INITIAL  5.  Patient will demonstrate grade of 5/5 MMT grade in all tested musculature as evidence of improved strength to assist with lifting  at home. Baseline:  Goal status: INITIAL    PLAN:  PT FREQUENCY: 1-2x/week  PT DURATION: 12 weeks  PLANNED INTERVENTIONS: 97164- PT Re-evaluation, 97110-Therapeutic exercises, 97530- Therapeutic activity, 97112- Neuromuscular re-education, 97535- Self Care, 29528- Manual therapy, [phone]- Gait training, [phone]- Orthotic Fit/training, [phone]- Canalith repositioning, [id number]- Aquatic Therapy, [phone]- Splinting, Patient/Family education, Balance training, Stair training, Taping, Dry Needling, Joint mobilization, Joint manipulation, Spinal manipulation, Spinal mobilization, Scar mobilization, and DME instructions.   PLAN FOR NEXT SESSION: progress with Dr Serena Croissant RCR protocol   Zebedee Iba, PT 10/18/2023, 8:48 AM

## 2023-10-20 ENCOUNTER — Ambulatory Visit (HOSPITAL_BASED_OUTPATIENT_CLINIC_OR_DEPARTMENT_OTHER): Payer: Managed Care, Other (non HMO) | Admitting: Orthopaedic Surgery

## 2023-10-20 DIAGNOSIS — S42252A Displaced fracture of greater tuberosity of left humerus, initial encounter for closed fracture: Secondary | ICD-10-CM

## 2023-10-20 NOTE — Progress Notes (Signed)
Post Operative Evaluation    Procedure/Date of Surgery: Left shoulder mini open rotator cuff repair and greater tuberosity repair 10/21  Interval History:   Presents today 2 weeks status post the above procedure.  Overall he is doing quite well.  He has been working with physical therapy.   PMH/PSH/Family History/Social History/Meds/Allergies:    Past Medical History:  Diagnosis Date   Constitutional growth delay    Goiter    Physical growth delay    Poor appetite    Puberty delay    Seizures (HCC)    Past Surgical History:  Procedure Laterality Date   HAND SURGERY Right    has screw in the right wrist/thumb area   ORIF HUMERUS FRACTURE Left 10/09/2023   Procedure: LEFT OPEN REDUCTION INTERNAL FIXATION (ORIF) PROXIMAL HUMERUS FRACTURE;  Surgeon: Huel Cote, MD;  Location: [facility] OR;  Service: Orthopedics;  Laterality: Left;   right ear surgery     Social History   Socioeconomic History   Marital status: Single    Spouse name: Not on file   Number of children: 1   Years of education: Not on file   Highest education level: Not on file  Occupational History   Not on file  Tobacco Use   Smoking status: Former    Types: Cigars, E-cigarettes   Smokeless tobacco: Never   Tobacco comments:    quit 2 months ago  Vaping Use   Vaping status: Every Day   Substances: Nicotine, Flavoring  Substance and Sexual Activity   Alcohol use: Yes    Comment: on the weekends sometimes. depends on the occassion beers   Drug use: No   Sexual activity: Yes    Birth control/protection: None  Other Topics Concern   Not on file  Social History Narrative   [place] (3)   Is engaged to [employer]   Fulltime: order selector at [employer]         Hobbies: none   Social Determinants of Health   Financial Resource Strain: Not on file  Food Insecurity: Not on file  Transportation Needs: Not on file  Physical Activity: Not on file  Stress: Not on  file  Social Connections: Not on file   Family History  Problem Relation Age of Onset   Thyroid disease Mother    Post-traumatic stress disorder Father    Stomach cancer Other    Cancer Maternal Great-grandmother        not sure of the type   Allergies  Allergen Reactions   Bee Venom Swelling   Amoxicillin Other (See Comments)    Did it involve swelling of the face/tongue/throat, SOB, or low BP? Unknown Did it involve sudden or severe rash/hives, skin peeling, or any reaction on the inside of your mouth or nose? Unknown Did you need to seek medical attention at a hospital or doctor's office? Unknown When did it last happen?  pt was a child     If all above answers are "NO", may proceed with cephalosporin use.    Current Outpatient Medications  Medication Sig Dispense Refill   aspirin EC 325 MG tablet Take 1 tablet (325 mg total) by mouth daily. 30 tablet 0   levETIRAcetam (KEPPRA) 750 MG tablet Take 1 tablet (750 mg total) by mouth daily. 90 tablet 1   oxyCODONE (  ROXICODONE) 5 MG immediate release tablet Take 1 tablet (5 mg total) by mouth every 4 (four) hours as needed for severe pain (pain score 7-10) or breakthrough pain. 10 tablet 0   No current facility-administered medications for this visit.   Facility-Administered Medications Ordered in Other Visits  Medication Dose Route Frequency Provider Last Rate Last Admin   gadobenate dimeglumine (MULTIHANCE) injection 12 mL  12 mL Intravenous Once PRN Dohmeier, Porfirio Mylar, MD       No results found.  Review of Systems:   A ROS was performed including pertinent positives and negatives as documented in the HPI.   Musculoskeletal Exam:    There were no vitals taken for this visit.  Left shoulder incision is well-appearing without erythema or drainage.  Passive range of motion of the spine position is 90 degrees flexion with external rotation at the side of 20 degrees.  Internal rotation deferred today.  Sensation intact  throughout  Imaging:      I personally reviewed and interpreted the radiographs.   Assessment:   2 weeks status post left shoulder greater tuberosity repair, overall doing well.  At this time we will continue to follow the rotator cuff repair protocol.  I will plan to see him back in 4 weeks for reassessment  Plan :    -Return to clinic 4 weeks for reassessment      I personally saw and evaluated the patient, and participated in the management and treatment plan.  Huel Cote, MD Attending Physician, Orthopedic Surgery  This document was dictated using Dragon voice recognition software. A reasonable attempt at proof reading has been made to minimize errors.

## 2023-10-30 ENCOUNTER — Encounter (HOSPITAL_BASED_OUTPATIENT_CLINIC_OR_DEPARTMENT_OTHER): Payer: Self-pay | Admitting: Physical Therapy

## 2023-10-30 ENCOUNTER — Ambulatory Visit (HOSPITAL_BASED_OUTPATIENT_CLINIC_OR_DEPARTMENT_OTHER): Payer: Managed Care, Other (non HMO) | Attending: Orthopaedic Surgery | Admitting: Physical Therapy

## 2023-10-30 DIAGNOSIS — S42252A Displaced fracture of greater tuberosity of left humerus, initial encounter for closed fracture: Secondary | ICD-10-CM | POA: Diagnosis present

## 2023-10-30 DIAGNOSIS — M25512 Pain in left shoulder: Secondary | ICD-10-CM | POA: Diagnosis present

## 2023-10-30 DIAGNOSIS — M6281 Muscle weakness (generalized): Secondary | ICD-10-CM | POA: Diagnosis present

## 2023-10-30 DIAGNOSIS — M25612 Stiffness of left shoulder, not elsewhere classified: Secondary | ICD-10-CM | POA: Insufficient documentation

## 2023-10-30 NOTE — Therapy (Signed)
OUTPATIENT PHYSICAL THERAPY SHOULDER EVALUATION   Patient Name: Howard Pena MRN: [id number] DOB:1996-11-20, 27 y.o., male Today's Date: 10/30/2023  END OF SESSION:  PT End of Session - 10/30/23 1402     Visit Number 3    Number of Visits 24    Date for PT Re-Evaluation 01/05/24    Authorization Type [employer]    PT Start Time 1402    PT Stop Time 1440    PT Time Calculation (min) 38 min    Activity Tolerance Patient tolerated treatment well    Behavior During Therapy WFL for tasks assessed/performed               Past Medical History:  Diagnosis Date   Constitutional growth delay    Goiter    Physical growth delay    Poor appetite    Puberty delay    Seizures (HCC)    Past Surgical History:  Procedure Laterality Date   HAND SURGERY Right    has screw in the right wrist/thumb area   ORIF HUMERUS FRACTURE Left 10/09/2023   Procedure: LEFT OPEN REDUCTION INTERNAL FIXATION (ORIF) PROXIMAL HUMERUS FRACTURE;  Surgeon: Huel Cote, MD;  Location: [facility] OR;  Service: Orthopedics;  Laterality: Left;   right ear surgery     Patient Active Problem List   Diagnosis Date Noted   Closed displaced fracture of greater tuberosity of left humerus 10/09/2023   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   Frequent bowel movements 11/17/2022   Sleep deprivation seizure (HCC) 04/14/2020   Convulsion (HCC) 03/30/2020   Shifting sleep-work schedule, affecting sleep 02/07/2020   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   Peritonsillar abscess 01/28/2020   Physical growth delay    Constitutional growth delay    Goiter    Poor appetite    Puberty delay    Lack of expected normal physiological development in childhood 04/21/2011   Goiter 04/21/2011   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, not elsewhere classified 04/21/2011    PCP: Mordecai Maes NP  REFERRING PROVIDER: Huel Cote, MD  REFERRING DIAG: S42.252A (ICD-10-CM) - Displaced fracture of greater tuberosity of  left humerus, initial encounter for closed fracture  THERAPY DIAG:  Left shoulder pain, unspecified chronicity  Muscle weakness (generalized)  Stiffness of left shoulder, not elsewhere classified  Rationale for Evaluation and Treatment: Rehabilitation  ONSET DATE: Post op 10/21  Days since surgery: 21   SUBJECTIVE:                                                                                                                                                                                      SUBJECTIVE STATEMENT:  Its  doing ok- I tried to lift it a little to see the progress, it didn't hurt or anything.   Eval:  Patient states shoulder was really sore from when the nerve block wore off. Been sleeping in recliner. Original fracture occurred when he hit his shoulder over the wall. Then had trouble using his arm. Stayed out of work for a bit and then went back to work and one day when he woke up his fracture was displaced. Has to be able to lift for work, physical job. Hand dominance: Right  PERTINENT HISTORY: s/p Left open reduction internal fixation greater tuberosity fracture  PAIN:  Are you having pain? Yes: NPRS scale: 5/10 Pain location: L shoulder Pain description: achy Aggravating factors: move quickly, sitting in one position for long period of time Relieving factors: meds   PRECAUTIONS: Shoulder and Other: Follow RCR protocol  RED FLAGS: None   WEIGHT BEARING RESTRICTIONS: Yes NWB LUE  FALLS:  Has patient fallen in last 6 months? Yes. Number of falls 5-10  OCCUPATION: [name]   PLOF: Independent  PATIENT GOALS:be able to lift his arm up   OBJECTIVE: (objective measures from initial evaluation unless otherwise dated) Observation: lateral deltoid incision with no signs of infection  PATIENT SURVEYS:  FOTO 28% function  COGNITION: Overall cognitive status: Within functional limits for tasks assessed     SENSATION: WFL  POSTURE:  rounded shoulders and forward head   UPPER EXTREMITY ROM:   Passive ROM Left eval  Shoulder flexion 90  Shoulder extension   Shoulder abduction 60  Shoulder adduction   Shoulder internal rotation   Shoulder external rotation neutral  Elbow flexion   Elbow extension   Wrist flexion   Wrist extension   Wrist ulnar deviation   Wrist radial deviation   Wrist pronation   Wrist supination   (Blank rows = not tested) *=pain/symptoms  UPPER EXTREMITY MMT: NT due to post op status  MMT Right  Left   Shoulder flexion    Shoulder extension    Shoulder abduction    Shoulder adduction    Shoulder internal rotation    Shoulder external rotation    Middle trapezius    Lower trapezius    Elbow flexion    Elbow extension    Wrist flexion    Wrist extension    Wrist ulnar deviation    Wrist radial deviation    Wrist pronation    Wrist supination    Grip strength (lbs)    (Blank rows = not tested) *=pain/symptoms   PALPATION:  Grossly TTP in L shoulder   TODAY'S TREATMENT:                                                                                                                                           DATE:  Treatment  11/11:  PROM flexion to 110, abd to 60, ER 10, IR 30 STM Lt upper trap, subscap Table slide on towel Upper trap & levator stretches    10/30  PROM: flexion to 85, ABD to 60, ER to 5, IR to 30  LAD with oscillation on open packed position  HEP review  Wound inspection and bandage change, review of HEP  10/13/23 EVAL, Dressing change, protocol/precautions Pendulums x 20 Elbow ROM  x 20 Wrist/hand ROM x20   PATIENT EDUCATION:  Education details: Patient educated on exam findings, POC, scope of PT, HEP, and protocol/precautions. Person educated: Patient Education method: Explanation, Demonstration, and Handouts Education comprehension: verbalized understanding, returned demonstration, verbal cues required,  and tactile cues required  HOME EXERCISE PROGRAM: Access Code: [email or web address] URL: [email or web address]    ASSESSMENT:  CLINICAL IMPRESSION: Making excellent progress toward goals, will continue to progress as appropriate. Advised to avoid active lifting of the arm right now. Questioned driving- advised to not drive if on pain meds.   OBJECTIVE IMPAIRMENTS: decreased activity tolerance, decreased balance, decreased endurance, decreased mobility, decreased ROM, decreased strength, hypomobility, increased muscle spasms, impaired flexibility, improper body mechanics, postural dysfunction, and pain.   ACTIVITY LIMITATIONS: carrying, lifting, bending, standing, bathing, toileting, dressing, reach over head, hygiene/grooming, locomotion level, and caring for others  PARTICIPATION LIMITATIONS: meal prep, cleaning, laundry, driving, shopping, community activity, occupation, and yard work  PERSONAL FACTORS: Time since onset of injury/illness/exacerbation and 1 comorbidity: hx seizures  are also affecting patient's functional outcome.   REHAB POTENTIAL: Good  CLINICAL DECISION MAKING: Stable/uncomplicated  EVALUATION COMPLEXITY: Low   GOALS: Goals reviewed with patient? Yes  SHORT TERM GOALS: Target date: 11/24/2023    Patient will be independent with HEP in order to improve functional outcomes. Baseline: Goal status: INITIAL  2.  Patient will report at least 25% improvement in symptoms for improved quality of life. Baseline:  Goal status: INITIAL  3.  Patient will demonstrate 140 degrees of L shoulder flexion PROM.  Baseline:  Goal status: INITIAL   LONG TERM GOALS: Target date: 01/05/2024    Patient will report at least 75% improvement in symptoms for improved quality of life. Baseline:  Goal status: INITIAL  2.  Patient will improve FOTO score to predicted outcomes in order to indicate improved tolerance to activity. Baseline: 28% function Goal status:  INITIAL  3.  Patient will demonstrate at least 160 in shoulder AROM in flexion for improved ability lift overhead. Baseline:  Goal status: INITIAL  4.  Patient will be able to return to all activities unrestricted for improved ability to perform work functions and participate with family.  Baseline:  Goal status: INITIAL  5.  Patient will demonstrate grade of 5/5 MMT grade in all tested musculature as evidence of improved strength to assist with lifting at home. Baseline:  Goal status: INITIAL    PLAN:  PT FREQUENCY: 1-2x/week  PT DURATION: 12 weeks  PLANNED INTERVENTIONS: 97164- PT Re-evaluation, 97110-Therapeutic exercises, 97530- Therapeutic activity, 97112- Neuromuscular re-education, 97535- Self Care, 91478- Manual therapy, [phone]- Gait training, [phone]- Orthotic Fit/training, [phone]- Canalith repositioning, [id number]- Aquatic Therapy, [phone]- Splinting, Patient/Family education, Balance training, Stair training, Taping, Dry Needling, Joint mobilization, Joint manipulation, Spinal manipulation, Spinal mobilization, Scar mobilization, and DME instructions.   PLAN FOR NEXT SESSION: progress with Dr Serena Croissant RCR protocol   Shanda Bumps C. Avyukt Cimo PT, DPT 10/30/23 2:41 PM

## 2023-11-08 ENCOUNTER — Telehealth: Payer: Self-pay | Admitting: Orthopaedic Surgery

## 2023-11-08 ENCOUNTER — Ambulatory Visit (HOSPITAL_BASED_OUTPATIENT_CLINIC_OR_DEPARTMENT_OTHER): Payer: Managed Care, Other (non HMO) | Admitting: Physical Therapy

## 2023-11-08 ENCOUNTER — Encounter (HOSPITAL_BASED_OUTPATIENT_CLINIC_OR_DEPARTMENT_OTHER): Payer: Self-pay | Admitting: Physical Therapy

## 2023-11-08 DIAGNOSIS — M6281 Muscle weakness (generalized): Secondary | ICD-10-CM

## 2023-11-08 DIAGNOSIS — M25512 Pain in left shoulder: Secondary | ICD-10-CM

## 2023-11-08 DIAGNOSIS — M25612 Stiffness of left shoulder, not elsewhere classified: Secondary | ICD-10-CM

## 2023-11-08 DIAGNOSIS — S42252A Displaced fracture of greater tuberosity of left humerus, initial encounter for closed fracture: Secondary | ICD-10-CM

## 2023-11-08 NOTE — Therapy (Signed)
OUTPATIENT PHYSICAL THERAPY TREATMENT   Patient Name: Howard Pena MRN: [id number] DOB:14-Oct-1996, 27 y.o., male Today's Date: 11/08/2023  END OF SESSION:  PT End of Session - 11/08/23 0752     Visit Number 4    Number of Visits 24    Date for PT Re-Evaluation 01/05/24    Authorization Type [employer]    PT Start Time 0753    PT Stop Time 0835    PT Time Calculation (min) 42 min    Activity Tolerance Patient tolerated treatment well    Behavior During Therapy WFL for tasks assessed/performed               Past Medical History:  Diagnosis Date   Constitutional growth delay    Goiter    Physical growth delay    Poor appetite    Puberty delay    Seizures (HCC)    Past Surgical History:  Procedure Laterality Date   HAND SURGERY Right    has screw in the right wrist/thumb area   ORIF HUMERUS FRACTURE Left 10/09/2023   Procedure: LEFT OPEN REDUCTION INTERNAL FIXATION (ORIF) PROXIMAL HUMERUS FRACTURE;  Surgeon: Huel Cote, MD;  Location: [facility] OR;  Service: Orthopedics;  Laterality: Left;   right ear surgery     Patient Active Problem List   Diagnosis Date Noted   Closed displaced fracture of greater tuberosity of left humerus 10/09/2023   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   Frequent bowel movements 11/17/2022   Sleep deprivation seizure (HCC) 04/14/2020   Convulsion (HCC) 03/30/2020   Shifting sleep-work schedule, affecting sleep 02/07/2020   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   Peritonsillar abscess 01/28/2020   Physical growth delay    Constitutional growth delay    Goiter    Poor appetite    Puberty delay    Lack of expected normal physiological development in childhood 04/21/2011   Goiter 04/21/2011   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, not elsewhere classified 04/21/2011    PCP: Mordecai Maes NP  REFERRING PROVIDER: Huel Cote, MD  REFERRING DIAG: S42.252A (ICD-10-CM) - Displaced fracture of greater tuberosity of left  humerus, initial encounter for closed fracture  THERAPY DIAG:  Left shoulder pain, unspecified chronicity  Muscle weakness (generalized)  Stiffness of left shoulder, not elsewhere classified  Displaced fracture of greater tuberosity of left humerus, initial encounter for closed fracture  Rationale for Evaluation and Treatment: Rehabilitation  ONSET DATE: Post op 10/21  Days since surgery: 30   SUBJECTIVE:  SUBJECTIVE STATEMENT:  Patient states improved ease with dressing. Doing HEP.   Eval:  Patient states shoulder was really sore from when the nerve block wore off. Been sleeping in recliner. Original fracture occurred when he hit his shoulder over the wall. Then had trouble using his arm. Stayed out of work for a bit and then went back to work and one day when he woke up his fracture was displaced. Has to be able to lift for work, physical job. Hand dominance: Right  PERTINENT HISTORY: s/p Left open reduction internal fixation greater tuberosity fracture  PAIN:  Are you having pain? Yes: NPRS scale: 0/10 Pain location: L shoulder Pain description: achy Aggravating factors: move quickly, sitting in one position for long period of time Relieving factors: meds   PRECAUTIONS: Shoulder and Other: Follow RCR protocol  RED FLAGS: None   WEIGHT BEARING RESTRICTIONS: Yes NWB LUE  FALLS:  Has patient fallen in last 6 months? Yes. Number of falls 5-10  OCCUPATION: [name]   PLOF: Independent  PATIENT GOALS:be able to lift his arm up   OBJECTIVE: (objective measures from initial evaluation unless otherwise dated) Observation: lateral deltoid incision with no signs of infection  PATIENT SURVEYS:  FOTO 28% function  COGNITION: Overall cognitive status: Within functional  limits for tasks assessed     SENSATION: WFL  POSTURE: rounded shoulders and forward head   UPPER EXTREMITY ROM:   Passive ROM Left eval Left 11/08/23  Shoulder flexion 90 PROM 135 AAROM 140  Shoulder extension    Shoulder abduction 60 PROM 90  Shoulder adduction    Shoulder internal rotation    Shoulder external rotation neutral AAROM 25  Elbow flexion    Elbow extension    Wrist flexion    Wrist extension    Wrist ulnar deviation    Wrist radial deviation    Wrist pronation    Wrist supination    (Blank rows = not tested) *=pain/symptoms  UPPER EXTREMITY MMT: NT due to post op status  MMT Right  Left   Shoulder flexion    Shoulder extension    Shoulder abduction    Shoulder adduction    Shoulder internal rotation    Shoulder external rotation    Middle trapezius    Lower trapezius    Elbow flexion    Elbow extension    Wrist flexion    Wrist extension    Wrist ulnar deviation    Wrist radial deviation    Wrist pronation    Wrist supination    Grip strength (lbs)    (Blank rows = not tested) *=pain/symptoms   PALPATION:  Grossly TTP in L shoulder   TODAY'S TREATMENT:   11/08/23 PROM flexion to 135, abd to 90, ER 10 Supine AAROM flexion with wand 3 x 10  Supine AAROM ER with wand 3 x 10  Pulley flexion 3 x 1 minute Sidelying shoulder abduction 3 x 10  Sidelying shoulder ER 2 x 10 Supine scapular protraction 2 x 10  DATE:  Treatment                            11/11:  PROM flexion to 110, abd to 60, ER 10, IR 30 STM Lt upper trap, subscap Table slide on towel Upper trap & levator stretches    10/30  PROM: flexion to 85, ABD to 60, ER to 5, IR to 30  LAD with oscillation on open packed position  HEP review  Wound inspection and bandage change, review of HEP  10/13/23 EVAL, Dressing change,  protocol/precautions Pendulums x 20 Elbow ROM  x 20 Wrist/hand ROM x20   PATIENT EDUCATION:  Education details: Patient educated on exam findings, POC, scope of PT, HEP, and protocol/precautions. 11/08/23: weaning from sling Person educated: Patient Education method: Explanation, Demonstration, and Handouts Education comprehension: verbalized understanding, returned demonstration, verbal cues required, and tactile cues required  HOME EXERCISE PROGRAM: Access Code: [email or web address] URL: [email or web address]    ASSESSMENT:  CLINICAL IMPRESSION: Patient now over 4 weeks post op. Patient demonstrating improving ROM and it continues to improve with exercises performed today. He tolerates progression into AAROM/AROM today well without pain throughout. Patient will continue to benefit from physical therapy in order to improve function and reduce impairment.   OBJECTIVE IMPAIRMENTS: decreased activity tolerance, decreased balance, decreased endurance, decreased mobility, decreased ROM, decreased strength, hypomobility, increased muscle spasms, impaired flexibility, improper body mechanics, postural dysfunction, and pain.   ACTIVITY LIMITATIONS: carrying, lifting, bending, standing, bathing, toileting, dressing, reach over head, hygiene/grooming, locomotion level, and caring for others  PARTICIPATION LIMITATIONS: meal prep, cleaning, laundry, driving, shopping, community activity, occupation, and yard work  PERSONAL FACTORS: Time since onset of injury/illness/exacerbation and 1 comorbidity: hx seizures  are also affecting patient's functional outcome.   REHAB POTENTIAL: Good  CLINICAL DECISION MAKING: Stable/uncomplicated  EVALUATION COMPLEXITY: Low   GOALS: Goals reviewed with patient? Yes  SHORT TERM GOALS: Target date: 11/24/2023    Patient will be independent with HEP in order to improve functional outcomes. Baseline: Goal status: INITIAL  2.  Patient will report at  least 25% improvement in symptoms for improved quality of life. Baseline:  Goal status: INITIAL  3.  Patient will demonstrate 140 degrees of L shoulder flexion PROM.  Baseline:  Goal status: INITIAL   LONG TERM GOALS: Target date: 01/05/2024    Patient will report at least 75% improvement in symptoms for improved quality of life. Baseline:  Goal status: INITIAL  2.  Patient will improve FOTO score to predicted outcomes in order to indicate improved tolerance to activity. Baseline: 28% function Goal status: INITIAL  3.  Patient will demonstrate at least 160 in shoulder AROM in flexion for improved ability lift overhead. Baseline:  Goal status: INITIAL  4.  Patient will be able to return to all activities unrestricted for improved ability to perform work functions and participate with family.  Baseline:  Goal status: INITIAL  5.  Patient will demonstrate grade of 5/5 MMT grade in all tested musculature as evidence of improved strength to assist with lifting at home. Baseline:  Goal status: INITIAL    PLAN:  PT FREQUENCY: 1-2x/week  PT DURATION: 12 weeks  PLANNED INTERVENTIONS: 97164- PT Re-evaluation, 97110-Therapeutic exercises, 97530- Therapeutic activity, 97112- Neuromuscular re-education, 97535- Self Care, 40981- Manual therapy, [phone]- Gait training, [phone]- Orthotic Fit/training, [phone]- Canalith repositioning, [id number]- Aquatic Therapy, [phone]- Splinting, Patient/Family education, Balance training, Stair training, Taping, Dry Needling, Joint mobilization, Joint manipulation, Spinal manipulation, Spinal mobilization,  Scar mobilization, and DME instructions.   PLAN FOR NEXT SESSION: progress with Dr Serena Croissant RCR protocol    Reola Mosher [facility], PT 11/08/2023, 7:52 AM

## 2023-11-08 NOTE — Telephone Encounter (Signed)
Received FMLA form, auth & $25 cash. To [employer].

## 2023-11-13 ENCOUNTER — Encounter (HOSPITAL_BASED_OUTPATIENT_CLINIC_OR_DEPARTMENT_OTHER): Payer: Managed Care, Other (non HMO) | Admitting: Physical Therapy

## 2023-11-15 ENCOUNTER — Ambulatory Visit (HOSPITAL_BASED_OUTPATIENT_CLINIC_OR_DEPARTMENT_OTHER): Payer: Managed Care, Other (non HMO) | Admitting: Physical Therapy

## 2023-11-15 ENCOUNTER — Ambulatory Visit (HOSPITAL_BASED_OUTPATIENT_CLINIC_OR_DEPARTMENT_OTHER): Payer: Managed Care, Other (non HMO)

## 2023-11-15 ENCOUNTER — Ambulatory Visit (HOSPITAL_BASED_OUTPATIENT_CLINIC_OR_DEPARTMENT_OTHER): Payer: Managed Care, Other (non HMO) | Admitting: Orthopaedic Surgery

## 2023-11-15 ENCOUNTER — Encounter (HOSPITAL_BASED_OUTPATIENT_CLINIC_OR_DEPARTMENT_OTHER): Payer: Self-pay | Admitting: Physical Therapy

## 2023-11-15 ENCOUNTER — Encounter (HOSPITAL_BASED_OUTPATIENT_CLINIC_OR_DEPARTMENT_OTHER): Payer: Self-pay

## 2023-11-15 DIAGNOSIS — S42252A Displaced fracture of greater tuberosity of left humerus, initial encounter for closed fracture: Secondary | ICD-10-CM

## 2023-11-15 DIAGNOSIS — M6281 Muscle weakness (generalized): Secondary | ICD-10-CM

## 2023-11-15 DIAGNOSIS — M25612 Stiffness of left shoulder, not elsewhere classified: Secondary | ICD-10-CM

## 2023-11-15 DIAGNOSIS — M25512 Pain in left shoulder: Secondary | ICD-10-CM | POA: Diagnosis not present

## 2023-11-15 NOTE — Therapy (Signed)
OUTPATIENT PHYSICAL THERAPY TREATMENT   Patient Name: Howard Pena MRN: [id number] DOB:10/28/1996, 27 y.o., male Today's Date: 11/15/2023  END OF SESSION:  PT End of Session - 11/15/23 1104     Visit Number 5    Number of Visits 24    Date for PT Re-Evaluation 01/05/24    Authorization Type [employer]    PT Start Time 1104    PT Stop Time 1142    PT Time Calculation (min) 38 min    Activity Tolerance Patient tolerated treatment well    Behavior During Therapy WFL for tasks assessed/performed               Past Medical History:  Diagnosis Date   Constitutional growth delay    Goiter    Physical growth delay    Poor appetite    Puberty delay    Seizures (HCC)    Past Surgical History:  Procedure Laterality Date   HAND SURGERY Right    has screw in the right wrist/thumb area   ORIF HUMERUS FRACTURE Left 10/09/2023   Procedure: LEFT OPEN REDUCTION INTERNAL FIXATION (ORIF) PROXIMAL HUMERUS FRACTURE;  Surgeon: Huel Cote, MD;  Location: [facility] OR;  Service: Orthopedics;  Laterality: Left;   right ear surgery     Patient Active Problem List   Diagnosis Date Noted   Closed displaced fracture of greater tuberosity of left humerus 10/09/2023   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   Frequent bowel movements 11/17/2022   Sleep deprivation seizure (HCC) 04/14/2020   Convulsion (HCC) 03/30/2020   Shifting sleep-work schedule, affecting sleep 02/07/2020   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   Peritonsillar abscess 01/28/2020   Physical growth delay    Constitutional growth delay    Goiter    Poor appetite    Puberty delay    Lack of expected normal physiological development in childhood 04/21/2011   Goiter 04/21/2011   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, not elsewhere classified 04/21/2011    PCP: Mordecai Maes NP  REFERRING PROVIDER: Huel Cote, MD  REFERRING DIAG: S42.252A (ICD-10-CM) - Displaced fracture of greater tuberosity of left  humerus, initial encounter for closed fracture  THERAPY DIAG:  Left shoulder pain, unspecified chronicity  Muscle weakness (generalized)  Stiffness of left shoulder, not elsewhere classified  Displaced fracture of greater tuberosity of left humerus, initial encounter for closed fracture  Rationale for Evaluation and Treatment: Rehabilitation  ONSET DATE: Post op 10/21  Days since surgery: 37   SUBJECTIVE:  SUBJECTIVE STATEMENT:  Patient states shoulder is doing better. Able to get arm up more. Staying out of work for now and will f/u with MD in another month.   Eval:  Patient states shoulder was really sore from when the nerve block wore off. Been sleeping in recliner. Original fracture occurred when he hit his shoulder over the wall. Then had trouble using his arm. Stayed out of work for a bit and then went back to work and one day when he woke up his fracture was displaced. Has to be able to lift for work, physical job. Hand dominance: Right  PERTINENT HISTORY: s/p Left open reduction internal fixation greater tuberosity fracture  PAIN:  Are you having pain? Yes: NPRS scale: 0/10 Pain location: L shoulder Pain description: achy Aggravating factors: move quickly, sitting in one position for long period of time Relieving factors: meds   PRECAUTIONS: Shoulder and Other: Follow RCR protocol  RED FLAGS: None   WEIGHT BEARING RESTRICTIONS: Yes NWB LUE  FALLS:  Has patient fallen in last 6 months? Yes. Number of falls 5-10  OCCUPATION: [name]   PLOF: Independent  PATIENT GOALS:be able to lift his arm up   OBJECTIVE: (objective measures from initial evaluation unless otherwise dated) Observation: lateral deltoid incision with no signs of infection  PATIENT SURVEYS:   FOTO 28% function  COGNITION: Overall cognitive status: Within functional limits for tasks assessed     SENSATION: WFL  POSTURE: rounded shoulders and forward head   UPPER EXTREMITY ROM:   Passive ROM Left eval Left 11/08/23 Left 11/15/23  Shoulder flexion 90 PROM 135 AAROM 140 AAROM 155  Shoulder extension     Shoulder abduction 60 PROM 90   Shoulder adduction     Shoulder internal rotation     Shoulder external rotation neutral AAROM 25  AAROM 30  Elbow flexion     Elbow extension     Wrist flexion     Wrist extension     Wrist ulnar deviation     Wrist radial deviation     Wrist pronation     Wrist supination     (Blank rows = not tested) *=pain/symptoms  UPPER EXTREMITY MMT: NT due to post op status  MMT Right  Left   Shoulder flexion    Shoulder extension    Shoulder abduction    Shoulder adduction    Shoulder internal rotation    Shoulder external rotation    Middle trapezius    Lower trapezius    Elbow flexion    Elbow extension    Wrist flexion    Wrist extension    Wrist ulnar deviation    Wrist radial deviation    Wrist pronation    Wrist supination    Grip strength (lbs)    (Blank rows = not tested) *=pain/symptoms   PALPATION:  Grossly TTP in L shoulder   TODAY'S TREATMENT:   11/15/23 Pulley flexion 3 x 1 minute Supine AAROM flexion with wand 3 x 10  Supine AAROM ER with wand 3 x 10  Wall walk flexion 1 x 10  Supine scapular protraction 4 x 10  Sidelying shoulder ER 2 x 10 Shoulder AROM flexion to 60 degrees 1 x 10  11/08/23 PROM flexion to 135, abd to 90, ER 10 Supine AAROM flexion with wand 3 x 10  Supine AAROM ER with wand 3 x 10  Pulley flexion 3 x 1 minute Sidelying shoulder abduction 3 x 10  Sidelying shoulder  ER 2 x 10 Supine scapular protraction 2 x 10                                                                                                                                          DATE:  Treatment                             11/11:  PROM flexion to 110, abd to 60, ER 10, IR 30 STM Lt upper trap, subscap Table slide on towel Upper trap & levator stretches    10/30  PROM: flexion to 85, ABD to 60, ER to 5, IR to 30  LAD with oscillation on open packed position  HEP review  Wound inspection and bandage change, review of HEP  10/13/23 EVAL, Dressing change, protocol/precautions Pendulums x 20 Elbow ROM  x 20 Wrist/hand ROM x20   PATIENT EDUCATION:  Education details: Patient educated on exam findings, POC, scope of PT, HEP, and protocol/precautions. 11/08/23: weaning from sling Person educated: Patient Education method: Explanation, Demonstration, and Handouts Education comprehension: verbalized understanding, returned demonstration, verbal cues required, and tactile cues required  HOME EXERCISE PROGRAM: Access Code: [email or web address] URL: [email or web address]    ASSESSMENT:  CLINICAL IMPRESSION: Patient now 5 weeks post op. Patient demonstrating improving ROM to 155 AAROM in supine. Shoulder hike with AROM in standing due to weakness which is present past 60 degrees. Patient progressing very well. Patient will continue to benefit from physical therapy in order to improve function and reduce impairment.   OBJECTIVE IMPAIRMENTS: decreased activity tolerance, decreased balance, decreased endurance, decreased mobility, decreased ROM, decreased strength, hypomobility, increased muscle spasms, impaired flexibility, improper body mechanics, postural dysfunction, and pain.   ACTIVITY LIMITATIONS: carrying, lifting, bending, standing, bathing, toileting, dressing, reach over head, hygiene/grooming, locomotion level, and caring for others  PARTICIPATION LIMITATIONS: meal prep, cleaning, laundry, driving, shopping, community activity, occupation, and yard work  PERSONAL FACTORS: Time since onset of injury/illness/exacerbation and 1 comorbidity: hx seizures  are also affecting  patient's functional outcome.   REHAB POTENTIAL: Good  CLINICAL DECISION MAKING: Stable/uncomplicated  EVALUATION COMPLEXITY: Low   GOALS: Goals reviewed with patient? Yes  SHORT TERM GOALS: Target date: 11/24/2023    Patient will be independent with HEP in order to improve functional outcomes. Baseline: Goal status: INITIAL  2.  Patient will report at least 25% improvement in symptoms for improved quality of life. Baseline:  Goal status: INITIAL  3.  Patient will demonstrate 140 degrees of L shoulder flexion PROM.  Baseline:  Goal status: INITIAL   LONG TERM GOALS: Target date: 01/05/2024    Patient will report at least 75% improvement in symptoms for improved quality of life. Baseline:  Goal status: INITIAL  2.  Patient will improve FOTO score to predicted outcomes in order to indicate improved tolerance to activity. Baseline: 28% function Goal  status: INITIAL  3.  Patient will demonstrate at least 160 in shoulder AROM in flexion for improved ability lift overhead. Baseline:  Goal status: INITIAL  4.  Patient will be able to return to all activities unrestricted for improved ability to perform work functions and participate with family.  Baseline:  Goal status: INITIAL  5.  Patient will demonstrate grade of 5/5 MMT grade in all tested musculature as evidence of improved strength to assist with lifting at home. Baseline:  Goal status: INITIAL    PLAN:  PT FREQUENCY: 1-2x/week  PT DURATION: 12 weeks  PLANNED INTERVENTIONS: 97164- PT Re-evaluation, 97110-Therapeutic exercises, 97530- Therapeutic activity, 97112- Neuromuscular re-education, 97535- Self Care, 16109- Manual therapy, [phone]- Gait training, [phone]- Orthotic Fit/training, [phone]- Canalith repositioning, [id number]- Aquatic Therapy, [phone]- Splinting, Patient/Family education, Balance training, Stair training, Taping, Dry Needling, Joint mobilization, Joint manipulation, Spinal manipulation, Spinal  mobilization, Scar mobilization, and DME instructions.   PLAN FOR NEXT SESSION: progress with Dr Serena Croissant RCR protocol    Reola Mosher [employer], PT 11/15/2023, 11:04 AM

## 2023-11-15 NOTE — Progress Notes (Signed)
Post Operative Evaluation    Procedure/Date of Surgery: Left shoulder mini open rotator cuff repair and greater tuberosity repair 10/21  Interval History:   Presents today status post the above procedure.  Overall he is doing very well.  His range of motion is improving and he is now actively overhead.  He has not been back to work.  He has been working in physical therapy   PMH/PSH/Family History/Social History/Meds/Allergies:    Past Medical History:  Diagnosis Date   Constitutional growth delay    Goiter    Physical growth delay    Poor appetite    Puberty delay    Seizures (HCC)    Past Surgical History:  Procedure Laterality Date   HAND SURGERY Right    has screw in the right wrist/thumb area   ORIF HUMERUS FRACTURE Left 10/09/2023   Procedure: LEFT OPEN REDUCTION INTERNAL FIXATION (ORIF) PROXIMAL HUMERUS FRACTURE;  Surgeon: Huel Cote, MD;  Location: [facility] OR;  Service: Orthopedics;  Laterality: Left;   right ear surgery     Social History   Socioeconomic History   Marital status: Single    Spouse name: Not on file   Number of children: 1   Years of education: Not on file   Highest education level: Not on file  Occupational History   Not on file  Tobacco Use   Smoking status: Former    Types: Cigars, E-cigarettes   Smokeless tobacco: Never   Tobacco comments:    quit 2 months ago  Vaping Use   Vaping status: Every Day   Substances: Nicotine, Flavoring  Substance and Sexual Activity   Alcohol use: Yes    Comment: on the weekends sometimes. depends on the occassion beers   Drug use: No   Sexual activity: Yes    Birth control/protection: None  Other Topics Concern   Not on file  Social History Narrative   Maddy[employer] (3)   Is engaged to [employer]   Fulltime: order selector at [employer]         Hobbies: none   Social Determinants of Health   Financial Resource Strain: Not on file  Food Insecurity: Not on file   Transportation Needs: Not on file  Physical Activity: Not on file  Stress: Not on file  Social Connections: Not on file   Family History  Problem Relation Age of Onset   Thyroid disease Mother    Post-traumatic stress disorder Father    Stomach cancer Other    Cancer Maternal Great-grandmother        not sure of the type   Allergies  Allergen Reactions   Bee Venom Swelling   Amoxicillin Other (See Comments)    Did it involve swelling of the face/tongue/throat, SOB, or low BP? Unknown Did it involve sudden or severe rash/hives, skin peeling, or any reaction on the inside of your mouth or nose? Unknown Did you need to seek medical attention at a hospital or doctor's office? Unknown When did it last happen?  pt was a child     If all above answers are "NO", may proceed with cephalosporin use.    Current Outpatient Medications  Medication Sig Dispense Refill   aspirin EC 325 MG tablet Take 1 tablet (325 mg total) by mouth daily. 30 tablet 0   levETIRAcetam (  KEPPRA) 750 MG tablet Take 1 tablet (750 mg total) by mouth daily. 90 tablet 1   oxyCODONE (ROXICODONE) 5 MG immediate release tablet Take 1 tablet (5 mg total) by mouth every 4 (four) hours as needed for severe pain (pain score 7-10) or breakthrough pain. 10 tablet 0   No current facility-administered medications for this visit.   Facility-Administered Medications Ordered in Other Visits  Medication Dose Route Frequency Provider Last Rate Last Admin   gadobenate dimeglumine (MULTIHANCE) injection 12 mL  12 mL Intravenous Once PRN Dohmeier, Porfirio Mylar, MD       No results found.  Review of Systems:   A ROS was performed including pertinent positives and negatives as documented in the HPI.   Musculoskeletal Exam:    There were no vitals taken for this visit.  Left shoulder incision is well-appearing without erythema or drainage.  Active forward elevation is to 150 degrees to compared to 170 on the contralateral.  External  rotation at side is to 45 compared to 60 on the contralateral side.  Internal range of motion is to L3 compared to T12 on the contralateral side  Imaging:      I personally reviewed and interpreted the radiographs.   Assessment:   6 weeks status post left shoulder greater tuberosity repair, overall doing well.  Back in 4 weeks for reassessment Plan :    -Return to clinic 4 weeks for reassessment with possible return to work at that point      I personally saw and evaluated the patient, and participated in the management and treatment plan.  Huel Cote, MD Attending Physician, Orthopedic Surgery  This document was dictated using Dragon voice recognition software. A reasonable attempt at proof reading has been made to minimize errors.

## 2023-11-20 ENCOUNTER — Encounter (HOSPITAL_BASED_OUTPATIENT_CLINIC_OR_DEPARTMENT_OTHER): Payer: Managed Care, Other (non HMO) | Admitting: Physical Therapy

## 2023-11-22 ENCOUNTER — Encounter (HOSPITAL_COMMUNITY): Payer: Self-pay

## 2023-11-22 ENCOUNTER — Ambulatory Visit (HOSPITAL_BASED_OUTPATIENT_CLINIC_OR_DEPARTMENT_OTHER): Payer: Managed Care, Other (non HMO) | Admitting: Orthopaedic Surgery

## 2023-11-22 ENCOUNTER — Ambulatory Visit (INDEPENDENT_AMBULATORY_CARE_PROVIDER_SITE_OTHER): Payer: Managed Care, Other (non HMO)

## 2023-11-22 ENCOUNTER — Other Ambulatory Visit (HOSPITAL_BASED_OUTPATIENT_CLINIC_OR_DEPARTMENT_OTHER): Payer: Self-pay | Admitting: Orthopaedic Surgery

## 2023-11-22 ENCOUNTER — Ambulatory Visit (HOSPITAL_COMMUNITY)
Admission: EM | Admit: 2023-11-22 | Discharge: 2023-11-22 | Disposition: A | Discharge status: Home / Self Care | Payer: Managed Care, Other (non HMO) | Attending: Sports Medicine | Admitting: Sports Medicine

## 2023-11-22 DIAGNOSIS — S42252D Displaced fracture of greater tuberosity of left humerus, subsequent encounter for fracture with routine healing: Secondary | ICD-10-CM | POA: Diagnosis not present

## 2023-11-22 DIAGNOSIS — M25512 Pain in left shoulder: Secondary | ICD-10-CM

## 2023-11-22 DIAGNOSIS — S42252S Displaced fracture of greater tuberosity of left humerus, sequela: Secondary | ICD-10-CM | POA: Diagnosis not present

## 2023-11-22 DIAGNOSIS — S42252A Displaced fracture of greater tuberosity of left humerus, initial encounter for closed fracture: Secondary | ICD-10-CM

## 2023-11-22 NOTE — Discharge Instructions (Signed)
[facility] will contact you to schedule a visit with Dr. Serena Croissant team later today. Remain in the sling between now and then and take tylenol/ibuprofen for pain.

## 2023-11-22 NOTE — ED Triage Notes (Addendum)
Pt presents with 8/10 left shoulder pain that began this morning. Pt states he had surgery on his left shoulder on 10/21. Pt went to follow-up with therapist, said if he feels comfortable, to sleep in bed (instead of recliner) and take off sling. Last night was the first night of sleeping in bed & taking off sling, woke up with severe pain, "not sure if I slept on it." Pt to follow-up with therapist tomorrow and surgeon 12/20. Pt denies taking medications for his pain.

## 2023-11-22 NOTE — Progress Notes (Signed)
Post Operative Evaluation    Procedure/Date of Surgery: Left shoulder mini open rotator cuff repair and greater tuberosity repair 10/21  Interval History:   Presents today status post the above procedure.  Unfortunately he states that he was sleeping last night and subsequently woke up with significant pain in the shoulder with limited active abduction of the left shoulder.  This is quite similar to how he felt after his initial displacement of the greater tuberosity   PMH/PSH/Family History/Social History/Meds/Allergies:    Past Medical History:  Diagnosis Date   Constitutional growth delay    Goiter    Physical growth delay    Poor appetite    Puberty delay    Seizures (HCC)    Past Surgical History:  Procedure Laterality Date   HAND SURGERY Right    has screw in the right wrist/thumb area   ORIF HUMERUS FRACTURE Left 10/09/2023   Procedure: LEFT OPEN REDUCTION INTERNAL FIXATION (ORIF) PROXIMAL HUMERUS FRACTURE;  Surgeon: Huel Cote, MD;  Location: [facility] OR;  Service: Orthopedics;  Laterality: Left;   right ear surgery     Social History   Socioeconomic History   Marital status: Single    Spouse name: Not on file   Number of children: 1   Years of education: Not on file   Highest education level: Not on file  Occupational History   Not on file  Tobacco Use   Smoking status: Former    Types: Cigars, E-cigarettes   Smokeless tobacco: Never   Tobacco comments:    quit 2 months ago  Vaping Use   Vaping status: Every Day   Substances: Nicotine, Flavoring  Substance and Sexual Activity   Alcohol use: Yes    Comment: on the weekends sometimes. depends on the occassion beers   Drug use: No   Sexual activity: Yes    Birth control/protection: None  Other Topics Concern   Not on file  Social History Narrative   Howard Pena (3)   Is engaged to [employer]   Fulltime: order selector at [employer]         Hobbies: none   Social  Determinants of Health   Financial Resource Strain: Not on file  Food Insecurity: Not on file  Transportation Needs: Not on file  Physical Activity: Not on file  Stress: Not on file  Social Connections: Not on file   Family History  Problem Relation Age of Onset   Thyroid disease Mother    Post-traumatic stress disorder Father    Stomach cancer Other    Cancer Maternal Great-grandmother        not sure of the type   Allergies  Allergen Reactions   Bee Venom Swelling   Amoxicillin Other (See Comments)    Did it involve swelling of the face/tongue/throat, SOB, or low BP? Unknown Did it involve sudden or severe rash/hives, skin peeling, or any reaction on the inside of your mouth or nose? Unknown Did you need to seek medical attention at a hospital or doctor's office? Unknown When did it last happen?  pt was a child     If all above answers are "NO", may proceed with cephalosporin use.    Current Outpatient Medications  Medication Sig Dispense Refill   aspirin EC 325 MG tablet Take 1 tablet (325 mg total)  by mouth daily. 30 tablet 0   levETIRAcetam (KEPPRA) 750 MG tablet Take 1 tablet (750 mg total) by mouth daily. 90 tablet 1   oxyCODONE (ROXICODONE) 5 MG immediate release tablet Take 1 tablet (5 mg total) by mouth every 4 (four) hours as needed for severe pain (pain score 7-10) or breakthrough pain. 10 tablet 0   No current facility-administered medications for this visit.   Facility-Administered Medications Ordered in Other Visits  Medication Dose Route Frequency Provider Last Rate Last Admin   gadobenate dimeglumine (MULTIHANCE) injection 12 mL  12 mL Intravenous Once PRN Dohmeier, Porfirio Mylar, MD       DG Shoulder Left  Result Date: 11/22/2023 CLINICAL DATA:  Shoulder pain. EXAM: LEFT SHOULDER - 2+ VIEW COMPARISON:  11/15/2023 FINDINGS: The shoulder appears located. Again seen is an impaction fracture deformity involving the greater tuberosity of the proximal humerus. Several  fracture fragments are identified posterior to the humeral head as well as along the superior margin of the humeral head. No new fracture identified. IMPRESSION: Unchanged appearance of fracture deformity involving the greater tuberosity of the proximal humerus. Several fracture fragments are identified posterior to the humeral head as well as along the superior margin of the humeral head. Electronically Signed   By: Signa Kell M.D.   On: 11/22/2023 10:05    Review of Systems:   A ROS was performed including pertinent positives and negatives as documented in the HPI.   Musculoskeletal Exam:    There were no vitals taken for this visit.  Left shoulder incision is well-appearing without erythema or drainage.  Active forward elevation is to 10 degrees with essentially pseudoparalysis.  Distal neurosensory exam is intact Imaging:      I personally reviewed and interpreted the radiographs.   Assessment:   7 weeks status post left shoulder greater tuberosity repair with concern for retear/displacement based on x-ray which was obtained in the emergency room.  At this time I would like to obtain a stat MRI to assess the integrity of repair.  I will plan to see him back following discuss results.   Plan :    -Return to clinic following MRI left shoulder      I personally saw and evaluated the patient, and participated in the management and treatment plan.  Huel Cote, MD Attending Physician, Orthopedic Surgery  This document was dictated using Dragon voice recognition software. A reasonable attempt at proof reading has been made to minimize errors.

## 2023-11-22 NOTE — ED Provider Notes (Signed)
[facility]    CSN: [id number] Arrival date & time: 11/22/23  0865      History   Chief Complaint Chief Complaint  Patient presents with   Shoulder Pain    HPI Howard Pena is a 27 y.o. male.   Howard Pena is a 27yo male here with complaint of left shoulder pain. He had an avulsion fracture of the greater tuberosity of his left humerus back in October and is 6 weeks s/p ORIF on 10/21 w/ Dr. Steward Drone. He had been doing well with recovery and physical therapy until this morning when he awoke at 4AM with 8-9/10 pain and significantly limited ROM. He has been out of his sling since his last PT appointment earlier this week. He denies using the arm much or any known re-injury, however last night was his first night sleeping in bed instead of recliner. He thinks he slept on that arm last night which prompted his pain. Hasn't taken any medications for this today. Denies radiation of pain or numbness tingling distally in left arm.   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   Peritonsillar abscess 01/28/2020   Physical growth delay    Constitutional growth delay    Goiter    Poor appetite    Puberty delay    Lack of expected normal physiological development in childhood 04/21/2011   Goiter 04/21/2011   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 Authorizing Provider  aspirin EC 325 MG tablet Take 1 tablet (325 mg total) by mouth daily. 10/04/23  Yes Huel Cote, MD  levETIRAcetam (KEPPRA) 750 MG tablet Take 1 tablet (750 mg total) by mouth daily. 11/17/22   Eden Emms, NP  oxyCODONE (ROXICODONE) 5 MG immediate release tablet Take 1 tablet (5 mg total) by mouth every 4 (four) hours as needed for severe pain (pain score 7-10) or breakthrough pain. 10/04/23   Huel Cote, MD    Family History Family History  Problem Relation Age of Onset   Thyroid disease Mother    Post-traumatic stress disorder Father    Stomach cancer Other    Cancer Maternal Great-grandmother        not sure of the type    Social History Social History   Tobacco Use   Smoking status: Former    Types: Cigars, E-cigarettes   Smokeless tobacco: Never   Tobacco comments:  quit 2 months ago  Vaping Use   Vaping status: Every Day   Substances: Nicotine, Flavoring  Substance Use Topics   Alcohol use: Yes    Comment: on the weekends sometimes. depends on the occassion beers   Drug use: No     Allergies   Bee venom and Amoxicillin   Review of Systems Review of Systems  Musculoskeletal:  Positive for joint swelling.     Physical Exam Updated Vital Signs BP 130/86 (BP Location: Right Arm)   Pulse 90   Temp 98.2 F (36.8 C) (Oral)   Resp 18   Wt 61.2 kg   SpO2 98%   BMI 20.53 kg/m    Physical Exam Constitutional:      General: He is not in acute distress.    Appearance: Normal appearance.  Musculoskeletal:     Comments: Left  Shoulder: No swelling, ecchymoses.  No gross deformity. Reproducible TTP in anterior and lateral shoulder. ROM significantly reduced - flexion 45d, abduction 30d, ER 5d, and IR to the iliac crest. Did not test strength in setting of recent surgery NV intact distally.   Neurological:     Mental Status: He is alert.    UC Treatments / Results  Labs (all labs ordered are listed, but only abnormal results are displayed) Labs Reviewed - No data to display  EKG   Radiology No results found.  Procedures Procedures (including critical care time)  Medications Ordered in UC Medications - No data to display  Initial Impression / Assessment and Plan / UC Course  I have reviewed the triage vital signs and the nursing notes.  Pertinent labs & imaging results that were available during my care of the patient were reviewed by me and considered in my medical decision making (see chart for details).    Vitals and triage reviewed, patient is hemodynamically stable.  Patient with 1 day of worsening left shoulder pain after recent left humerus greater tuberosity avulsion fracture and surgical repair on 10/21. X-rays today with what appears on my read like an avulsed fragment of bone off of his greater tuberosity again today compared to his x-rays at his post-op follow-up last week. I spoke with the on-call PA at [facility], Karenann Cai, who stated their office will contact the patient to schedule a visit later today. Patient was placed back into his sling for comfort today and advised [facility] follow-up. Advised tylenol/ibuprofen for pain control today. Patient's questions were answered and they are in agreement with this plan.   Final Clinical Impressions(s) / UC Diagnoses   Final diagnoses:  Displaced fracture of greater tuberosity of left humerus, sequela     Discharge Instructions      [facility] will contact you to schedule a visit with Dr. Serena Croissant team later today. Remain in the  sling between now and then and take tylenol/ibuprofen for pain.     ED Prescriptions   None    PDMP not reviewed this encounter.   Marisa Cyphers, MD 11/22/23 1007

## 2023-11-23 ENCOUNTER — Ambulatory Visit (HOSPITAL_BASED_OUTPATIENT_CLINIC_OR_DEPARTMENT_OTHER): Payer: Managed Care, Other (non HMO) | Admitting: Physical Therapy

## 2023-11-25 ENCOUNTER — Ambulatory Visit
Admission: RE | Admit: 2023-11-25 | Discharge: 2023-11-25 | Disposition: A | Discharge status: Home / Self Care | Payer: Managed Care, Other (non HMO) | Source: Ambulatory Visit | Attending: Orthopaedic Surgery | Admitting: Orthopaedic Surgery

## 2023-11-25 DIAGNOSIS — S42252A Displaced fracture of greater tuberosity of left humerus, initial encounter for closed fracture: Secondary | ICD-10-CM

## 2023-11-27 ENCOUNTER — Encounter (HOSPITAL_BASED_OUTPATIENT_CLINIC_OR_DEPARTMENT_OTHER): Payer: Managed Care, Other (non HMO) | Admitting: Physical Therapy

## 2023-11-29 ENCOUNTER — Ambulatory Visit (HOSPITAL_BASED_OUTPATIENT_CLINIC_OR_DEPARTMENT_OTHER): Payer: Managed Care, Other (non HMO) | Admitting: Physical Therapy

## 2023-11-29 ENCOUNTER — Other Ambulatory Visit (HOSPITAL_BASED_OUTPATIENT_CLINIC_OR_DEPARTMENT_OTHER): Payer: Self-pay | Admitting: Orthopaedic Surgery

## 2023-11-29 DIAGNOSIS — S42252A Displaced fracture of greater tuberosity of left humerus, initial encounter for closed fracture: Secondary | ICD-10-CM

## 2023-11-30 ENCOUNTER — Other Ambulatory Visit (HOSPITAL_BASED_OUTPATIENT_CLINIC_OR_DEPARTMENT_OTHER): Payer: Self-pay | Admitting: Orthopaedic Surgery

## 2023-11-30 ENCOUNTER — Encounter (HOSPITAL_BASED_OUTPATIENT_CLINIC_OR_DEPARTMENT_OTHER): Payer: Self-pay | Admitting: Orthopaedic Surgery

## 2023-11-30 ENCOUNTER — Encounter (HOSPITAL_BASED_OUTPATIENT_CLINIC_OR_DEPARTMENT_OTHER): Payer: Managed Care, Other (non HMO) | Admitting: Physical Therapy

## 2023-11-30 DIAGNOSIS — M75121 Complete rotator cuff tear or rupture of right shoulder, not specified as traumatic: Secondary | ICD-10-CM | POA: Diagnosis not present

## 2023-11-30 MED ORDER — OXYCODONE HCL 5 MG PO TABS: 5.0000 mg | ORAL_TABLET | ORAL | 0 refills | Status: DC | PRN

## 2023-11-30 MED ORDER — ASPIRIN 325 MG PO TBEC: 325.0000 mg | DELAYED_RELEASE_TABLET | Freq: Every day | ORAL | 0 refills | Status: DC

## 2023-11-30 MED ORDER — IBUPROFEN 800 MG PO TABS: 800.0000 mg | ORAL_TABLET | Freq: Three times a day (TID) | ORAL | 0 refills | Status: AC

## 2023-11-30 MED ORDER — ACETAMINOPHEN 500 MG PO TABS: 500.0000 mg | ORAL_TABLET | Freq: Three times a day (TID) | ORAL | 0 refills | Status: AC

## 2023-11-30 NOTE — Progress Notes (Unsigned)
Date of Surgery: 11/30/2023  INDICATIONS: Mr. Eiler is a 27 y.o.-year-old male with recurrent left rotator cuff tear.  The risk and benefits of the procedure were discussed in detail and documented in the pre-operative evaluation.   PREOPERATIVE DIAGNOSES: Left shoulder, acute traumatic rotator cuff tear.  POSTOPERATIVE DIAGNOSIS: Same.  PROCEDURE: Arthroscopic limited debridement - 69485 Arthroscopic subacromial decompression - 46270 Arthroscopic rotator cuff repair - 35009  SURGEON: Benancio Deeds MD  ASSISTANT: Kerby Less, ATC  ANESTHESIA:  general  IV FLUIDS AND URINE: See anesthesia record.  ANTIBIOTICS: Ancef  ESTIMATED BLOOD LOSS: 5 mL.  IMPLANTS:  * No surgical log found *  DRAINS: None  CULTURES: None  COMPLICATIONS: none  PROCEDURE:    OPERATIVE FINDING: Exam under anesthesia:   Examination under anesthesia revealed forward elevation of 120 degrees.  With the arm at the side, there was 25 degrees of external rotation.  There is a 1+ anterior load shift and a 1+ posterior load shift.    Arthroscopic findings demonstrated: Articular space: Thickened scarring and rotator cuff interval Chondral surfaces: Normal Biceps: Normal Subscapularis: Intact Supraspinatus: Anterior tearing full-thickness Infraspinatus: Intact    I identified the patient in the pre-operative holding area.  I marked the operative right shoulder with my initials. I reviewed the risks and benefits of the proposed surgical intervention and the patient wished to proceed.  Anesthesia was then performed with regional block.  The patient was transferred to the operative suite and placed in the beach chair position with all bony prominences padded.     SCDs were placed on bilateral lower extremity. Appropriate antibiotics was administered within 1 hour before incision.  Anesthesia was induced.  The operative extremity was then prepped and draped in standard fashion. A time out was  performed confirming the correct extremity, correct patient and correct procedure.   The arthroscope was introduced in the glenohumeral joint from a posterior portal.  An anterior portal was created.  The shoulder was examined and the above findings were noted.     With an arthroscopic shaver and a wand ablator, synovitis throughout the  shoulder was resected.  The arthroscopic shaver was used to excise torn portions of the labrum back to a stable margin. Specifically this was done for the anterior superior rotator interval.      Through visualization from intra-articular, the footprint of the rotator cuff was debrided of soft tissues back to bleeding bone.  The previous sutures were removed.  This was done with an arthroscopic shaver.     The rotator cuff was then approached through the subacromial space. Anterior, lateral, and posterior portals were used.  Bursectomy was performed with an arthroscopic shaver and [employer] wand.  The soft tissues on the undersurface of the acromion and clavicle were resected with the arthroscopic shaver and wand. There was good excursion that was noted of the tendon back to its footprint which would be amendable to an repair.   The rotator cuff was repaired with a double row configuration with 1 medial row all suture anchot as noted above with sutures passed from anterior to posterior in horizontal fashion with a self passing suture device.  A total of 4 limbs of suture were passed.  The sutures were placed with a single lateral row anchor.  This provided excellent anatomic footprint approximation.   The shoulder was irrigated.  The arthroscopic instruments were removed.  Wounds were closed with 3-0 nylon sutures.  A sterile dressing was applied with xeroform, 4x8s,  abdominal pad, and tape. An Flonnie Hailstone was placed and the upper extremity was placed in a shoulder immobilizer.  The patient tolerated the procedure well and was taken to the recovery room in stable condition.   All counts were correct in the case. The patient tolerated the procedure well and was taken to the recovery room in stable condition.    POSTOPERATIVE PLAN: He will follow the rotator cuff repair protocol.  He will remain in a sling while sleeping at night.  This will be for the first 2 months postop.  I will plan to see him back in 2 weeks for suture removal.  He will be placed on aspirin for blood clot prevention  Benancio Deeds, MD 5:55 PM

## 2023-12-01 ENCOUNTER — Encounter (HOSPITAL_BASED_OUTPATIENT_CLINIC_OR_DEPARTMENT_OTHER): Payer: Managed Care, Other (non HMO) | Admitting: Orthopaedic Surgery

## 2023-12-04 ENCOUNTER — Ambulatory Visit (HOSPITAL_BASED_OUTPATIENT_CLINIC_OR_DEPARTMENT_OTHER): Payer: Managed Care, Other (non HMO) | Attending: Orthopaedic Surgery | Admitting: Physical Therapy

## 2023-12-04 ENCOUNTER — Encounter (HOSPITAL_BASED_OUTPATIENT_CLINIC_OR_DEPARTMENT_OTHER): Payer: Self-pay | Admitting: Physical Therapy

## 2023-12-04 DIAGNOSIS — M25512 Pain in left shoulder: Secondary | ICD-10-CM | POA: Diagnosis present

## 2023-12-04 DIAGNOSIS — M6281 Muscle weakness (generalized): Secondary | ICD-10-CM | POA: Diagnosis present

## 2023-12-04 DIAGNOSIS — S42252A Displaced fracture of greater tuberosity of left humerus, initial encounter for closed fracture: Secondary | ICD-10-CM | POA: Insufficient documentation

## 2023-12-04 DIAGNOSIS — M25612 Stiffness of left shoulder, not elsewhere classified: Secondary | ICD-10-CM | POA: Diagnosis present

## 2023-12-04 NOTE — Therapy (Addendum)
OUTPATIENT PHYSICAL THERAPY TREATMENT   Patient Name: Howard Pena MRN: [id number] DOB:[birth year], 27 y.o., male Today's Date: 12/04/2023  END OF SESSION:  PT End of Session - 12/04/23 1517     Visit Number 6    Number of Visits 24    Date for PT Re-Evaluation 03/03/24    Authorization Type [employer]    PT Start Time 1451    PT Stop Time 1516    PT Time Calculation (min) 25 min    Activity Tolerance Patient tolerated treatment well    Behavior During Therapy WFL for tasks assessed/performed                Past Medical History:  Diagnosis Date   Constitutional growth delay    Goiter    Physical growth delay    Poor appetite    Puberty delay    Seizures (HCC)    Past Surgical History:  Procedure Laterality Date   HAND SURGERY Right    has screw in the right wrist/thumb area   ORIF HUMERUS FRACTURE Left 10/09/2023   Procedure: LEFT OPEN REDUCTION INTERNAL FIXATION (ORIF) PROXIMAL HUMERUS FRACTURE;  Surgeon: Huel Cote, MD;  Location: [facility] OR;  Service: Orthopedics;  Laterality: Left;   right ear surgery     Patient Active Problem List   Diagnosis Date Noted   Closed displaced fracture of greater tuberosity of left humerus 10/09/2023   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   Frequent bowel movements 11/17/2022   Sleep deprivation seizure (HCC) 04/14/2020   Convulsion (HCC) 03/30/2020   Shifting sleep-work schedule, affecting sleep 02/07/2020   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   Peritonsillar abscess 01/28/2020   Physical growth delay    Constitutional growth delay    Goiter    Poor appetite    Puberty delay    Lack of expected normal physiological development in childhood 04/21/2011   Goiter 04/21/2011   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, not elsewhere classified 04/21/2011    PCP: Mordecai Maes NP  REFERRING PROVIDER: Huel Cote, MD  REFERRING DIAG:  S42.252A (ICD-10-CM) - Displaced fracture of greater tuberosity of left  humerus, initial encounter for closed fracture      THERAPY DIAG:  Left shoulder pain, unspecified chronicity  Muscle weakness (generalized)  Stiffness of left shoulder, not elsewhere classified  Rationale for Evaluation and Treatment: Rehabilitation  ONSET DATE: Post op 11/30/23 RCR repair  Days since surgery: 4    SUBJECTIVE:  SUBJECTIVE STATEMENT:  Pt returns to therapy today following RCR after finding a full thickness tear. Pt woke up one more morning with increased pain and inability to raise the arm up. Pt states surgery went well. He has not had any signficant pain outside of nerve block wearing off. Pt states surgical bandages fell off last night. He reports not having aspirin at home. Denies s/s of infection  Hand dominance: Right  PERTINENT HISTORY: s/p Left open reduction internal fixation greater tuberosity fracture  PAIN:  Are you having pain? No: NPRS scale: 0/10 Pain location: L shoulder Pain description: achy Aggravating factors: move quickly, sitting in one position for long period of time Relieving factors: meds   PRECAUTIONS: Shoulder and Other: Follow RCR protocol  RED FLAGS: None   WEIGHT BEARING RESTRICTIONS: Yes NWB LUE  FALLS:  Has patient fallen in last 6 months? Yes. Number of falls 5-10  OCCUPATION: [name]   PLOF: Independent  PATIENT GOALS: return to normal activity, play with son, return to work   OBJECTIVE: (objective measures from initial evaluation unless otherwise dated)  Observation: lateral L shoulder incision sites with no signs of infection; clean dry wounds, no surgical bandage in place  PATIENT SURVEYS:  FOTO 28% function  COGNITION: Overall cognitive status: Within functional limits for tasks  assessed     SENSATION: WFL  POSTURE: rounded shoulders and forward head   UPPER EXTREMITY ROM:   Passive ROM Left eval  Shoulder flexion 75  Shoulder extension   Shoulder abduction 50  Shoulder adduction   Shoulder internal rotation   Shoulder external rotation 0  Elbow flexion   Elbow extension   Wrist flexion   Wrist extension   Wrist ulnar deviation   Wrist radial deviation   Wrist pronation   Wrist supination   (Blank rows = not tested) *=pain/symptoms  UPPER EXTREMITY MMT: NT due to surgical precautions  MMT Right  Left   Shoulder flexion    Shoulder extension    Shoulder abduction    Shoulder adduction    Shoulder internal rotation    Shoulder external rotation    Middle trapezius    Lower trapezius    Elbow flexion    Elbow extension    Wrist flexion    Wrist extension    Wrist ulnar deviation    Wrist radial deviation    Wrist pronation    Wrist supination    Grip strength (lbs)    (Blank rows = not tested) *=pain/symptoms   12/16  PROM: flexion to 75, ABD to 50, ER to 0, IR to belly   HEP review  Wound inspection and bandage change, review of HEP  Review of regressed HEP  Exercises - Circular Shoulder Pendulum with Table Support  - 2 x daily - 7 x weekly - 5 reps - 1 minute hold - Forearm Pronation and Supination With Shoulder Sling (Mirrored)  - 2 x daily - 7 x weekly - 20 reps - Wrist Flexion and Extension With Shoulder Sling (Mirrored)  - 2 x daily - 7 x weekly - 20 reps - Seated Elbow Flexion Extension AROM  - 2 x daily - 7 x weekly - 3 sets - 10 reps - Ball Squeeze With Shoulder Sling  - 2 x daily - 7 x weekly - 20 reps - Seated Scapular Retraction    PATIENT EDUCATION:  Education details: surgical precautions, diagnosis, prognosis, anatomy, exercise progression, DOMS expectations, muscle firing,  envelope of function, HEP, POC  Person educated: Patient Education method: Explanation, Demonstration, and Handouts Education  comprehension: verbalized understanding, returned demonstration, verbal cues required, and tactile cues required  HOME EXERCISE PROGRAM: Access Code: [email or web address] URL: [email or web address]    ASSESSMENT:  CLINICAL IMPRESSION: Pt returns to PT following new surgery on 11/30/23 for [facility] repair of the L shoulder. Pt also with history of recent L ORIF of the humeral head. Pt has very well managed pain at this time. Pt advised on returning to previous surgical precautions. Wounds are clean and dry without signs of infection. HEP discussed. Pt with expected ROM and strength limitation given post-op status. Plan to continue with RCR protocol. Pt would benefit from continued skilled therapy in order to reach goals and maximize functional  L UE strength and ROM for full return to PLOF.    OBJECTIVE IMPAIRMENTS: decreased activity tolerance, decreased balance, decreased endurance, decreased mobility, decreased ROM, decreased strength, hypomobility, increased muscle spasms, impaired flexibility, improper body mechanics, postural dysfunction, and pain.   ACTIVITY LIMITATIONS: carrying, lifting, bending, standing, bathing, toileting, dressing, reach over head, hygiene/grooming, locomotion level, and caring for others  PARTICIPATION LIMITATIONS: meal prep, cleaning, laundry, driving, shopping, community activity, occupation, and yard work  PERSONAL FACTORS: Time since onset of injury/illness/exacerbation and 1 comorbidity: hx seizures  are also affecting patient's functional outcome.   REHAB POTENTIAL: Good  CLINICAL DECISION MAKING: Stable/uncomplicated  EVALUATION COMPLEXITY: Low   GOALS: Goals reviewed with patient? Yes  SHORT TERM GOALS: Target date: 01/15/2024    Patient will be independent with HEP in order to improve functional outcomes. Baseline: Goal status: INITIAL  2. Pt will be able to demonstrate at least ability to perform PROM to protocol limits without pain in order  to demonstrate functional improvement in UE function with RC repair protocol.  Baseline:  Goal status: INITIAL  3.  Pt will be able to demonstrate at least 160 flexion and ABD AAROM in order to demonstrate functional improvement in UE ROM for progression to next phase of rehab. Baseline:  Goal status: INITIAL   LONG TERM GOALS: Target date: 02/26/2024     Pt  will become independent with final HEP in order to demonstrate synthesis of PT education. Baseline:  Goal status: INITIAL  2.  Patient will improve FOTO score to predicted outcomes in order to indicate improved tolerance to activity. Baseline: Goal status: INITIAL  3.  Pt will be able to reach [facility] and carry/hold >3 lbs in order to demonstrate functional improvement in L UE strength for return to light OH activity.  Baseline:  Goal status: INITIAL  4.  Pt will be able to demonstrate full AROM of the L shoulder  in order to demonstrate functional improvement in UE for self-care and house hold duties.  Baseline:  Goal status: INITIAL  5.  Patient will demonstrate grade of 5/5 MMT grade in all tested musculature as evidence of improved strength to assist with lifting at home. Baseline:  Goal status: INITIAL    PLAN:  PT FREQUENCY: 1-2x/week  PT DURATION: 12 weeks  PLANNED INTERVENTIONS: 97164- PT Re-evaluation, 97110-Therapeutic exercises, 97530- Therapeutic activity, 97112- Neuromuscular re-education, 97535- Self Care, 62952- Manual therapy, [phone]- Gait training, [phone]- Orthotic Fit/training, [phone]- Canalith repositioning, [id number]- Aquatic Therapy, [phone]- Splinting, Patient/Family education, Balance training, Stair training, Taping, Dry Needling, Joint mobilization, Joint manipulation, Spinal manipulation, Spinal mobilization, Scar mobilization, and DME instructions.   PLAN FOR NEXT SESSION: progress with Dr Serena Croissant RCR protocol    Zebedee Iba, PT 12/04/2023, 3:28 PM

## 2023-12-04 NOTE — Addendum Note (Signed)
Addended by: Zebedee Iba on: 12/04/2023 03:29 PM   Modules accepted: Orders

## 2023-12-05 ENCOUNTER — Encounter (HOSPITAL_BASED_OUTPATIENT_CLINIC_OR_DEPARTMENT_OTHER): Payer: Self-pay | Admitting: Orthopaedic Surgery

## 2023-12-08 ENCOUNTER — Encounter (HOSPITAL_BASED_OUTPATIENT_CLINIC_OR_DEPARTMENT_OTHER): Payer: Managed Care, Other (non HMO) | Admitting: Orthopaedic Surgery

## 2023-12-09 NOTE — Therapy (Signed)
OUTPATIENT PHYSICAL THERAPY TREATMENT   Patient Name: Howard Pena MRN: [id number] DOB:November 27, 1996, 27 y.o., male Today's Date: 12/11/2023  END OF SESSION:  PT End of Session - 12/11/23 0855     Visit Number 7    Number of Visits 24    Date for PT Re-Evaluation 03/03/24    Authorization Type [employer]    PT Start Time [phone]    PT Stop Time 0930    PT Time Calculation (min) 40 min    Activity Tolerance Patient tolerated treatment well;No increased pain    Behavior During Therapy WFL for tasks assessed/performed                 Past Medical History:  Diagnosis Date   Constitutional growth delay    Goiter    Physical growth delay    Poor appetite    Puberty delay    Seizures (HCC)    Past Surgical History:  Procedure Laterality Date   HAND SURGERY Right    has screw in the right wrist/thumb area   ORIF HUMERUS FRACTURE Left 10/09/2023   Procedure: LEFT OPEN REDUCTION INTERNAL FIXATION (ORIF) PROXIMAL HUMERUS FRACTURE;  Surgeon: Huel Cote, MD;  Location: [facility] OR;  Service: Orthopedics;  Laterality: Left;   right ear surgery     Patient Active Problem List   Diagnosis Date Noted   Closed displaced fracture of greater tuberosity of left humerus 10/09/2023   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   Frequent bowel movements 11/17/2022   Sleep deprivation seizure (HCC) 04/14/2020   Convulsion (HCC) 03/30/2020   Shifting sleep-work schedule, affecting sleep 02/07/2020   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   Peritonsillar abscess 01/28/2020   Physical growth delay    Constitutional growth delay    Goiter    Poor appetite    Puberty delay    Lack of expected normal physiological development in childhood 04/21/2011   Goiter 04/21/2011   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, not elsewhere classified 04/21/2011    PCP: Mordecai Maes NP  REFERRING PROVIDER: Huel Cote, MD  REFERRING DIAG:  S42.252A (ICD-10-CM) - Displaced fracture of greater  tuberosity of left humerus, initial encounter for closed fracture    post op RCR  THERAPY DIAG:  Left shoulder pain, unspecified chronicity  Stiffness of left shoulder, not elsewhere classified  Muscle weakness (generalized)  Rationale for Evaluation and Treatment: Rehabilitation  ONSET DATE: Post op 11/30/23 RCR repair  Days since surgery: 4    SUBJECTIVE:  SUBJECTIVE STATEMENT:  Pt is 1 week and 4 days s/p L shoulder arthroscopic rotator cuff repair, subacromial decompression, and limited debridement.  Pt reports he felt a pop in post shoulder/scap when he was sliding back in a seated position.  He was in the sling, did not use his arm, and had no pain.  Pt states the bandage fell off.  Pt reports compliance with HEP.  Pt states he is wearing his sling.    Hand dominance: Right  PERTINENT HISTORY: L shoulder arthroscopic rotator cuff repair, subacromial decompression, and limited debridement on 11/30/23 s/p Left open reduction internal fixation greater tuberosity fracture on 10/09/2023  PAIN:  Are you having pain? No: NPRS scale: 0/10 at rest, 5/10 pain with certain movements such as dressing Pain location: L shoulder Pain description: achy Aggravating factors: move quickly, sitting in one position for long period of time Relieving factors: meds   PRECAUTIONS: Shoulder and Other: Follow RCR protocol  RED FLAGS: None   WEIGHT BEARING RESTRICTIONS: Yes NWB LUE  FALLS:  Has patient fallen in last 6 months? Yes. Number of falls 5-10  OCCUPATION: [name]   PLOF: Independent  PATIENT GOALS: return to normal activity, play with son, return to work   OBJECTIVE: (objective measures from initial evaluation unless otherwise dated)   Reviewed pt presentation, HEP/sling  compliance, and pain level.  Pt had no dressings over portals.  Portals were intact with stitches.  Pt had no drainage and no signs of infection.  PT applied gauze and tegaderm over portals.  Pt performed: Pendulums 2x10 each cw and ccw Wrist flexion/extension in sling x20 reps Towel gripping 2x10 in sling  Pt received L shoulder flexion and ER PROM per pt and tissue tolerance within protocol ranges.    See below for pt education.    PATIENT EDUCATION:  Education details:   PT educated pt in post op and protocol restrictions/limitations including to be compliant with sling and to not use arm.  HEP, exercise form, dressings, relevant anatomy dx, prognosis, and POC. Person educated: Patient Education method: Explanation, Demonstration, verbal cues Education comprehension: verbalized understanding, returned demonstration, verbal cues required  HOME EXERCISE PROGRAM: Access Code: [email or web address] URL: [email or web address]    ASSESSMENT:  CLINICAL IMPRESSION: Pt presents to Rx without a bandage over his portals stating the bandage fell off.  PT assessed portals and he had no signs of infection.  PT applied gauze and tegaderm over portals.  Pt reports compliance with HEP and sling usage.  Pt performed exercises per protocol well and had improved form with pendulums with cuing and instruction.  PT performed L shoulder PROM w/n protocol ranges and pt tolerated PROM well.  Pt had improved flexion PROM compared to prior visit.  PT performed gentle ER PROM including not performing into a painful range or into tension.  He has tightness in ER.  Pt responded well to Rx having no pain and no c/o's after Rx.  He should benefit from cont skilled PT to improve ROM, address impairments, and improve overall function.     OBJECTIVE IMPAIRMENTS: decreased activity tolerance, decreased balance, decreased endurance, decreased mobility, decreased ROM, decreased strength, hypomobility, increased  muscle spasms, impaired flexibility, improper body mechanics, postural dysfunction, and pain.   ACTIVITY LIMITATIONS: carrying, lifting, bending, standing, bathing, toileting, dressing, reach over head, hygiene/grooming, locomotion level, and caring for others  PARTICIPATION LIMITATIONS: meal prep, cleaning, laundry, driving, shopping, community activity, occupation, and yard work  PERSONAL FACTORS: Time since onset of injury/illness/exacerbation and  1 comorbidity: hx seizures  are also affecting patient's functional outcome.   REHAB POTENTIAL: Good  CLINICAL DECISION MAKING: Stable/uncomplicated  EVALUATION COMPLEXITY: Low   GOALS: Goals reviewed with patient? Yes  SHORT TERM GOALS: Target date: 01/15/2024    Patient will be independent with HEP in order to improve functional outcomes. Baseline: Goal status: INITIAL  2. Pt will be able to demonstrate at least ability to perform PROM to protocol limits without pain in order to demonstrate functional improvement in UE function with RC repair protocol.  Baseline:  Goal status: INITIAL  3.  Pt will be able to demonstrate at least 160 flexion and ABD AAROM in order to demonstrate functional improvement in UE ROM for progression to next phase of rehab. Baseline:  Goal status: INITIAL   LONG TERM GOALS: Target date: 02/26/2024     Pt  will become independent with final HEP in order to demonstrate synthesis of PT education. Baseline:  Goal status: INITIAL  2.  Patient will improve FOTO score to predicted outcomes in order to indicate improved tolerance to activity. Baseline: Goal status: INITIAL  3.  Pt will be able to reach [facility] and carry/hold >3 lbs in order to demonstrate functional improvement in L UE strength for return to light [place] activity.  Baseline:  Goal status: INITIAL  4.  Pt will be able to demonstrate full AROM of the L shoulder  in order to demonstrate functional improvement in UE for self-care and house hold  duties.  Baseline:  Goal status: INITIAL  5.  Patient will demonstrate grade of 5/5 MMT grade in all tested musculature as evidence of improved strength to assist with lifting at home. Baseline:  Goal status: INITIAL    PLAN:  PT FREQUENCY: 1-2x/week  PT DURATION: 12 weeks  PLANNED INTERVENTIONS: 97164- PT Re-evaluation, 97110-Therapeutic exercises, 97530- Therapeutic activity, 97112- Neuromuscular re-education, 97535- Self Care, 09811- Manual therapy, [phone]- Gait training, [phone]- Orthotic Fit/training, [phone]- Canalith repositioning, [id number]- Aquatic Therapy, [phone]- Splinting, Patient/Family education, Balance training, Stair training, Taping, Dry Needling, Joint mobilization, Joint manipulation, Spinal manipulation, Spinal mobilization, Scar mobilization, and DME instructions.   PLAN FOR NEXT SESSION: progress with Dr Serena Croissant RCR protocol    Audie Clear III PT, DPT 12/11/23 11:48 AM

## 2023-12-11 ENCOUNTER — Encounter (HOSPITAL_BASED_OUTPATIENT_CLINIC_OR_DEPARTMENT_OTHER): Payer: Self-pay | Admitting: Physical Therapy

## 2023-12-11 ENCOUNTER — Ambulatory Visit (HOSPITAL_BASED_OUTPATIENT_CLINIC_OR_DEPARTMENT_OTHER): Payer: Managed Care, Other (non HMO) | Admitting: Physical Therapy

## 2023-12-11 DIAGNOSIS — M25512 Pain in left shoulder: Secondary | ICD-10-CM | POA: Diagnosis not present

## 2023-12-11 DIAGNOSIS — M25612 Stiffness of left shoulder, not elsewhere classified: Secondary | ICD-10-CM

## 2023-12-11 DIAGNOSIS — M6281 Muscle weakness (generalized): Secondary | ICD-10-CM

## 2023-12-12 ENCOUNTER — Other Ambulatory Visit: Payer: Self-pay | Admitting: Nurse Practitioner

## 2023-12-12 NOTE — Telephone Encounter (Signed)
LAST APPOINTMENT DATE: No showed app 05/18/23 New patient 11/17/22 Has had acute visits with other providers in office.    NEXT APPOINTMENT DATE: Visit date not found    LAST REFILL: 11/17/22  QTY: #90 1 rf

## 2023-12-14 ENCOUNTER — Ambulatory Visit (INDEPENDENT_AMBULATORY_CARE_PROVIDER_SITE_OTHER): Payer: Managed Care, Other (non HMO) | Admitting: Student

## 2023-12-14 ENCOUNTER — Telehealth (HOSPITAL_BASED_OUTPATIENT_CLINIC_OR_DEPARTMENT_OTHER): Payer: Self-pay

## 2023-12-14 ENCOUNTER — Encounter (HOSPITAL_BASED_OUTPATIENT_CLINIC_OR_DEPARTMENT_OTHER): Payer: Self-pay | Admitting: Student

## 2023-12-14 DIAGNOSIS — S46012A Strain of muscle(s) and tendon(s) of the rotator cuff of left shoulder, initial encounter: Secondary | ICD-10-CM

## 2023-12-14 NOTE — Progress Notes (Signed)
Post Operative Evaluation    Procedure/Date of Surgery: Left shoulder arthroscopic rotator cuff repair with limited debridement and decompression 11/30/2023  Interval History:   Patient presents today 2 weeks status post the above procedure.  Overall he states that recovery is going well.  Pain is mild and has been taking oxycodone as needed.  He has been compliant with sling usage and HEP.  Has had 2 visits so far with physical therapy.   PMH/PSH/Family History/Social History/Meds/Allergies:    Past Medical History:  Diagnosis Date   Constitutional growth delay    Goiter    Physical growth delay    Poor appetite    Puberty delay    Seizures (HCC)    Past Surgical History:  Procedure Laterality Date   HAND SURGERY Right    has screw in the right wrist/thumb area   ORIF HUMERUS FRACTURE Left 10/09/2023   Procedure: LEFT OPEN REDUCTION INTERNAL FIXATION (ORIF) PROXIMAL HUMERUS FRACTURE;  Surgeon: Huel Cote, MD;  Location: [facility] OR;  Service: Orthopedics;  Laterality: Left;   right ear surgery     Social History   Socioeconomic History   Marital status: Single    Spouse name: Not on file   Number of children: 1   Years of education: Not on file   Highest education level: Not on file  Occupational History   Not on file  Tobacco Use   Smoking status: Former    Types: Cigars, E-cigarettes   Smokeless tobacco: Never   Tobacco comments:    quit 2 months ago  Vaping Use   Vaping status: Every Day   Substances: Nicotine, Flavoring  Substance and Sexual Activity   Alcohol use: Yes    Comment: on the weekends sometimes. depends on the occassion beers   Drug use: No   Sexual activity: Yes    Birth control/protection: None  Other Topics Concern   Not on file  Social History Narrative   [place] (3)   Is engaged to [employer]   Fulltime: order selector at [employer]         Hobbies: none   Social Drivers of Research scientist (physical sciences) Strain: Not on file  Food Insecurity: Not on file  Transportation Needs: Not on file  Physical Activity: Not on file  Stress: Not on file  Social Connections: Not on file   Family History  Problem Relation Age of Onset   Thyroid disease Mother    Post-traumatic stress disorder Father    Stomach cancer Other    Cancer Maternal Great-grandmother        not sure of the type   Allergies  Allergen Reactions   Bee Venom Swelling   Amoxicillin Other (See Comments)    Did it involve swelling of the face/tongue/throat, SOB, or low BP? Unknown Did it involve sudden or severe rash/hives, skin peeling, or any reaction on the inside of your mouth or nose? Unknown Did you need to seek medical attention at a hospital or doctor's office? Unknown When did it last happen?  pt was a child     If all above answers are "NO", may proceed with cephalosporin use.    Current Outpatient Medications  Medication Sig Dispense Refill   aspirin EC 325 MG tablet Take 1 tablet (325 mg total) by mouth  daily. 14 tablet 0   oxyCODONE (ROXICODONE) 5 MG immediate release tablet Take 1 tablet (5 mg total) by mouth every 4 (four) hours as needed for severe pain (pain score 7-10) or breakthrough pain. 30 tablet 0   levETIRAcetam (KEPPRA) 750 MG tablet Take 1 tablet (750 mg total) by mouth daily. 90 tablet 1   No current facility-administered medications for this visit.   Facility-Administered Medications Ordered in Other Visits  Medication Dose Route Frequency Provider Last Rate Last Admin   gadobenate dimeglumine (MULTIHANCE) injection 12 mL  12 mL Intravenous Once PRN Dohmeier, Porfirio Mylar, MD       No results found.  Review of Systems:   A ROS was performed including pertinent positives and negatives as documented in the HPI.   Musculoskeletal Exam:    There were no vitals taken for this visit.  Left shoulder incisions are well-appearing with sutures in place without evidence of erythema or drainage.   Patient's left arm immobilized in a sling.  Distal neurosensory exam is intact.  Imaging:    Assessment:   Patient is 2 weeks status post left shoulder rotator cuff repair overall doing well.  Given that he did sustain a retear from prior surgery in October, discussed that we will continue with caution considering his rehab.  Sutures were removed today and incisions are well-appearing without signs of infection.  He should remain in the sling and continue following rotator cuff repair protocol.  Plan :    -Return to clinic in 4 weeks for reassessment     I personally saw and evaluated the patient, and participated in the management and treatment plan.   Hazle Nordmann, PA-C Orthopedics

## 2023-12-14 NOTE — Telephone Encounter (Signed)
Patient came into the office today and stated his job never received the FMLA with the oow extension. Can you please help with this

## 2023-12-14 NOTE — Telephone Encounter (Signed)
FMLA form updated and faxed with oow note

## 2023-12-15 NOTE — Telephone Encounter (Signed)
Spoke to pt, scheduled cpe for 02/07/24

## 2023-12-15 NOTE — Telephone Encounter (Signed)
Patient is over due for an office visit. Needs CPE within 90 days to continue getting medication

## 2023-12-21 ENCOUNTER — Ambulatory Visit (HOSPITAL_BASED_OUTPATIENT_CLINIC_OR_DEPARTMENT_OTHER): Payer: Managed Care, Other (non HMO) | Attending: Orthopaedic Surgery | Admitting: Physical Therapy

## 2023-12-21 DIAGNOSIS — G8929 Other chronic pain: Secondary | ICD-10-CM | POA: Insufficient documentation

## 2023-12-21 DIAGNOSIS — S42252A Displaced fracture of greater tuberosity of left humerus, initial encounter for closed fracture: Secondary | ICD-10-CM | POA: Diagnosis present

## 2023-12-21 DIAGNOSIS — M25612 Stiffness of left shoulder, not elsewhere classified: Secondary | ICD-10-CM | POA: Insufficient documentation

## 2023-12-21 DIAGNOSIS — M25611 Stiffness of right shoulder, not elsewhere classified: Secondary | ICD-10-CM | POA: Diagnosis present

## 2023-12-21 DIAGNOSIS — M25512 Pain in left shoulder: Secondary | ICD-10-CM | POA: Diagnosis present

## 2023-12-21 DIAGNOSIS — M6281 Muscle weakness (generalized): Secondary | ICD-10-CM | POA: Diagnosis present

## 2023-12-21 DIAGNOSIS — M25511 Pain in right shoulder: Secondary | ICD-10-CM | POA: Diagnosis present

## 2023-12-21 NOTE — Therapy (Addendum)
 OUTPATIENT PHYSICAL THERAPY TREATMENT   Patient Name: Howard Pena MRN: [id number] DOB:05/11/1996, 28 y.o., male Today's Date: 12/21/2023  END OF SESSION:  PT End of Session - 12/21/23 1452     Visit Number 8    Number of Visits 24    Date for PT Re-Evaluation 03/03/24    Authorization Type [employer]    PT Start Time 1400    PT Stop Time 1433    PT Time Calculation (min) 33 min    Activity Tolerance Patient tolerated treatment well;No increased pain    Behavior During Therapy WFL for tasks assessed/performed              Past Medical History:  Diagnosis Date   Constitutional growth delay    Goiter    Physical growth delay    Poor appetite    Puberty delay    Seizures (HCC)    Past Surgical History:  Procedure Laterality Date   HAND SURGERY Right    has screw in the right wrist/thumb area   ORIF HUMERUS FRACTURE Left 10/09/2023   Procedure: LEFT OPEN REDUCTION INTERNAL FIXATION (ORIF) PROXIMAL HUMERUS FRACTURE;  Surgeon: Genelle Standing, MD;  Location: [facility] OR;  Service: Orthopedics;  Laterality: Left;   right ear surgery     Patient Active Problem List   Diagnosis Date Noted   Closed displaced fracture of greater tuberosity of left humerus 10/09/2023   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   Frequent bowel movements 11/17/2022   Sleep deprivation seizure (HCC) 04/14/2020   Convulsion (HCC) 03/30/2020   Shifting sleep-work schedule, affecting sleep 02/07/2020   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   Peritonsillar abscess 01/28/2020   Physical growth delay    Constitutional growth delay    Goiter    Poor appetite    Puberty delay    Lack of expected normal physiological development in childhood 04/21/2011   Goiter 04/21/2011   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  Pt states he is wearing his sling.      Hand dominance: Right  PERTINENT HISTORY: L shoulder arthroscopic rotator cuff repair, subacromial decompression, and limited debridement on 11/30/23 s/p Left open reduction internal fixation greater tuberosity fracture on 10/09/2023  PAIN:  Are you having pain? No: NPRS scale: 0/10 at rest, Pain location: L shoulder Pain description:  Aggravating factors: move quickly, sitting in one position for long period of time Relieving factors: meds   PRECAUTIONS: Shoulder and Other: Follow RCR protocol  RED FLAGS: None   WEIGHT BEARING RESTRICTIONS: Yes NWB LUE  FALLS:  Has patient fallen in last 6 months? Yes. Number of falls 5-10  OCCUPATION: [employer]   PLOF: Independent  PATIENT GOALS: return to normal activity, play with son, return to work   OBJECTIVE: (objective measures from initial evaluation unless otherwise dated)  UPPER EXTREMITY ROM:    Passive ROM Left eval Left  12/21/23  Shoulder flexion 75 ~105  Shoulder extension     Shoulder abduction 50 60  Shoulder adduction     Shoulder internal rotation     Shoulder external rotation 0 5  Elbow flexion     Elbow extension     Wrist flexion     Wrist extension     Wrist ulnar deviation     Wrist radial deviation     Wrist pronation     Wrist supination      (Blank rows = not tested) *=pain/symptoms  TODAY's TREATMENT:  Therapeutic exercise: LUE Pendulums x10 each cw and ccw, side to side and front to back motion (with LE in wide stance and staggered stance)  L elbow flex/ext x 10 in standing L wrist pronation/ supination x 5 (seated)  L wrist flex/ext Reviewed towel gripping; encouraged 5-10s holds at home for HEP  Manual: PROM L shoulder flexion, abdct (in scapular plane) and ER to tissue tolerance, no pain, and within protocol ranges.   STM to Lt periscapular muscles/ thoracic paraspinals/ pec major with pt in supine, arm supported by pillow  PATIENT EDUCATION:  Education details:    HEP, exercise form, rehab protocol  Person educated: Patient Education method: Explanation, Demonstration, verbal cues Education comprehension: verbalized understanding, returned demonstration, verbal cues required  HOME EXERCISE PROGRAM: Access Code: [email or web address] URL: [email or web address]    ASSESSMENT:  CLINICAL IMPRESSION:    Pt performed exercises per protocol well.  Therapist performed L shoulder PROM w/n protocol ranges and pt tolerated PROM well.  Lt shoulder flexion PROM improved from eval.  Pt responded well,  having no pain and no c/o's after treatment.  He should benefit from continued skilled PT to improve ROM, address impairments, and improve overall function.     OBJECTIVE IMPAIRMENTS: decreased activity tolerance, decreased balance, decreased endurance, decreased mobility, decreased ROM, decreased strength, hypomobility, increased muscle spasms, impaired flexibility, improper body mechanics, postural dysfunction, and pain.   ACTIVITY LIMITATIONS: carrying, lifting, bending, standing, bathing, toileting, dressing, reach over head, hygiene/grooming, locomotion level, and caring for others  PARTICIPATION LIMITATIONS: meal prep, cleaning, laundry, driving, shopping, community activity, occupation, and yard work  PERSONAL  FACTORS: Time since onset of injury/illness/exacerbation and 1 comorbidity: hx seizures  are also affecting patient's functional outcome.   REHAB POTENTIAL: Good  CLINICAL DECISION MAKING: Stable/uncomplicated  EVALUATION COMPLEXITY: Low   GOALS: Goals reviewed with patient? Yes  SHORT TERM GOALS: Target date: 01/15/2024    Patient will be independent with HEP in order to  improve functional outcomes. Baseline: Goal status: INITIAL  2. Pt will be able to demonstrate at least ability to perform PROM to protocol limits without pain in order to demonstrate functional improvement in UE function with RC repair protocol.  Baseline:  Goal status: INITIAL  3.  Pt will be able to demonstrate at least 160 flexion and ABD AAROM in order to demonstrate functional improvement in UE ROM for progression to next phase of rehab. Baseline:  Goal status: INITIAL   LONG TERM GOALS: Target date: 02/26/2024     Pt  will become independent with final HEP in order to demonstrate synthesis of PT education. Baseline:  Goal status: INITIAL  2.  Patient will improve FOTO score to predicted outcomes in order to indicate improved tolerance to activity. Baseline: Goal status: INITIAL  3.  Pt will be able to reach [facility] and carry/hold >3 lbs in order to demonstrate functional improvement in L UE strength for return to light OH activity.  Baseline:  Goal status: INITIAL  4.  Pt will be able to demonstrate full AROM of the L shoulder  in order to demonstrate functional improvement in UE for self-care and house hold duties.  Baseline:  Goal status: INITIAL  5.  Patient will demonstrate grade of 5/5 MMT grade in all tested musculature as evidence of improved strength to assist with lifting at home. Baseline:  Goal status: INITIAL    PLAN:  PT FREQUENCY: 1-2x/week  PT DURATION: 12 weeks  PLANNED INTERVENTIONS: 97164- PT Re-evaluation, 97110-Therapeutic exercises, 97530- Therapeutic activity, 97112-  Neuromuscular re-education, 97535- Self Care, 02859- Manual therapy, [phone]- Gait training, [phone]- Orthotic Fit/training, [phone]- Canalith repositioning, [id number]- Aquatic Therapy, [phone]- Splinting, Patient/Family education, Balance training, Stair training, Taping, Dry Needling, Joint mobilization, Joint manipulation, Spinal manipulation, Spinal mobilization, Scar mobilization, and DME instructions.   PLAN FOR NEXT SESSION: progress with Dr Danetta RCR protocol  Delon Aquas, PTA 12/21/23 6:09 PM [facility] [address] [place], [place], [address] Phone: [phone]   Fax:  [phone]

## 2023-12-26 ENCOUNTER — Encounter (HOSPITAL_BASED_OUTPATIENT_CLINIC_OR_DEPARTMENT_OTHER): Payer: Self-pay | Admitting: Physical Therapy

## 2023-12-26 ENCOUNTER — Ambulatory Visit (HOSPITAL_BASED_OUTPATIENT_CLINIC_OR_DEPARTMENT_OTHER): Payer: Managed Care, Other (non HMO) | Admitting: Physical Therapy

## 2023-12-26 DIAGNOSIS — M25612 Stiffness of left shoulder, not elsewhere classified: Secondary | ICD-10-CM

## 2023-12-26 DIAGNOSIS — M25512 Pain in left shoulder: Secondary | ICD-10-CM | POA: Diagnosis not present

## 2023-12-26 DIAGNOSIS — M6281 Muscle weakness (generalized): Secondary | ICD-10-CM

## 2023-12-26 NOTE — Therapy (Signed)
 OUTPATIENT PHYSICAL THERAPY TREATMENT   Patient Name: Howard Pena MRN: [id number] DOB:September 26, 1996, 28 y.o., male Today's Date: 12/26/2023  END OF SESSION:  PT End of Session - 12/26/23 1111     Visit Number 9    Number of Visits 24    Date for PT Re-Evaluation 03/03/24    Authorization Type [employer]    PT Start Time 1030    PT Stop Time 1105    PT Time Calculation (min) 35 min    Activity Tolerance Patient tolerated treatment well;No increased pain    Behavior During Therapy WFL for tasks assessed/performed               Past Medical History:  Diagnosis Date   Constitutional growth delay    Goiter    Physical growth delay    Poor appetite    Puberty delay    Seizures (HCC)    Past Surgical History:  Procedure Laterality Date   HAND SURGERY Right    has screw in the right wrist/thumb area   ORIF HUMERUS FRACTURE Left 10/09/2023   Procedure: LEFT OPEN REDUCTION INTERNAL FIXATION (ORIF) PROXIMAL HUMERUS FRACTURE;  Surgeon: Genelle Standing, MD;  Location: [facility] OR;  Service: Orthopedics;  Laterality: Left;   right ear surgery     Patient Active Problem List   Diagnosis Date Noted   Closed displaced fracture of greater tuberosity of left humerus 10/09/2023   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   Frequent bowel movements 11/17/2022   Sleep deprivation seizure (HCC) 04/14/2020   Convulsion (HCC) 03/30/2020   Shifting sleep-work schedule, affecting sleep 02/07/2020   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   Peritonsillar abscess 01/28/2020   Physical growth delay    Constitutional growth delay    Goiter    Poor appetite    Puberty delay    Lack of expected normal physiological development in childhood 04/21/2011   Goiter 04/21/2011   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, not elsewhere classified 04/21/2011    PCP: Lynwood Crandall NP  REFERRING PROVIDER: Genelle Standing, MD  REFERRING DIAG:  S42.252A (ICD-10-CM) - Displaced fracture of greater  tuberosity of left humerus, initial encounter for closed fracture    post op RCR  THERAPY DIAG:  Left shoulder pain, unspecified chronicity  Stiffness of left shoulder, not elsewhere classified  Muscle weakness (generalized)  Rationale for Evaluation and Treatment: Rehabilitation  ONSET DATE: Post op 11/30/23 RCR repair  Days since surgery: 4    SUBJECTIVE:  SUBJECTIVE STATEMENT: Pt reports he is still having popping in his R shoulder when moving positions  (sitting/standing; rolling over) - while in sling; not painful.     Hand dominance: Right  PERTINENT HISTORY: L shoulder arthroscopic rotator cuff repair, subacromial decompression, and limited debridement on 11/30/23 s/p Left open reduction internal fixation greater tuberosity fracture on 10/09/2023  PAIN:  Are you having pain? No: NPRS scale: 0/10  Pain location: L shoulder Pain description:  Aggravating factors: move quickly, sitting in one position for long period of time Relieving factors: meds   PRECAUTIONS: Shoulder and Other: Follow RCR protocol  RED FLAGS: None   WEIGHT BEARING RESTRICTIONS: Yes NWB LUE  FALLS:  Has patient fallen in last 6 months? Yes. Number of falls 5-10  OCCUPATION: [employer]   PLOF: Independent  PATIENT GOALS: return to normal activity, play with son, return to work   OBJECTIVE: (objective measures from initial evaluation unless otherwise dated)  UPPER EXTREMITY ROM:    Passive ROM Left eval Left  12/21/23  Shoulder flexion 75 ~105  Shoulder extension     Shoulder abduction 50 60  Shoulder adduction     Shoulder internal rotation     Shoulder external rotation 0 5  Elbow flexion     Elbow extension     Wrist flexion     Wrist extension     Wrist ulnar deviation      Wrist radial deviation     Wrist pronation     Wrist supination     (Blank rows = not tested) *=pain/symptoms  TODAY's TREATMENT:  Therapeutic exercise: LUE Pendulums x10 each cw and ccw,  front to back motion (with LE in wide stance and staggered stance)  Scap squeeze x 3sec x 10 L levator stretch x 15s;  R lateral neck flexion stretch x 15s L elbow flex/ext x 10 in standing L wrist pronation/ supination x 10 (seated)   Manual: PROM L shoulder flexion, abdct (in scapular plane) and ER to tissue tolerance, no pain, and within protocol ranges.   STM to Lt periscapular muscles/ thoracic paraspinals/ pec major with pt in supine, arm supported by pillow  PATIENT EDUCATION:  Education details:   rehab protocol  Person educated: Patient Education method: Explanation,  verbal cues Education comprehension: verbalized understanding, returned demonstration, verbal cues required  HOME EXERCISE PROGRAM: Access Code: [email or web address] URL: [email or web address]    ASSESSMENT:  CLINICAL IMPRESSION: Pt is 3 wks 5 days s/p L rotator cuff repair. Pt performed exercises per protocol well.  Therapist performed L shoulder PROM w/n protocol ranges and pt tolerated PROM well.   Pt responded well,  having no pain and no c/o's after treatment.  He should benefit from continued skilled PT to improve ROM, address impairments, and improve overall function.     OBJECTIVE IMPAIRMENTS: decreased activity tolerance, decreased balance, decreased endurance, decreased mobility, decreased ROM, decreased strength, hypomobility, increased muscle spasms, impaired flexibility, improper body mechanics, postural dysfunction, and pain.   ACTIVITY LIMITATIONS: carrying, lifting, bending, standing, bathing, toileting, dressing, reach over head, hygiene/grooming, locomotion level, and caring for others  PARTICIPATION LIMITATIONS: meal prep, cleaning, laundry, driving, shopping, community activity, occupation, and  yard work  PERSONAL FACTORS: Time since onset of injury/illness/exacerbation and 1 comorbidity: hx seizures  are also affecting patient's functional outcome.   REHAB POTENTIAL: Good  CLINICAL DECISION MAKING: Stable/uncomplicated  EVALUATION COMPLEXITY: Low   GOALS: Goals reviewed with patient? Yes  SHORT TERM GOALS: Target date: 01/15/2024  Patient will be independent with HEP in order to improve functional outcomes. Baseline: Goal status: INITIAL  2. Pt will be able to demonstrate at least ability to perform PROM to protocol limits without pain in order to demonstrate functional improvement in UE function with RC repair protocol.  Baseline:  Goal status: INITIAL  3.  Pt will be able to demonstrate at least 160 flexion and ABD AAROM in order to demonstrate functional improvement in UE ROM for progression to next phase of rehab. Baseline:  Goal status: INITIAL   LONG TERM GOALS: Target date: 02/26/2024     Pt  will become independent with final HEP in order to demonstrate synthesis of PT education. Baseline:  Goal status: INITIAL  2.  Patient will improve FOTO score to predicted outcomes in order to indicate improved tolerance to activity. Baseline: Goal status: INITIAL  3.  Pt will be able to reach [facility] and carry/hold >3 lbs in order to demonstrate functional improvement in L UE strength for return to light [place] activity.  Baseline:  Goal status: INITIAL  4.  Pt will be able to demonstrate full AROM of the L shoulder  in order to demonstrate functional improvement in UE for self-care and house hold duties.  Baseline:  Goal status: INITIAL  5.  Patient will demonstrate grade of 5/5 MMT grade in all tested musculature as evidence of improved strength to assist with lifting at home. Baseline:  Goal status: INITIAL    PLAN:  PT FREQUENCY: 1-2x/week  PT DURATION: 12 weeks  PLANNED INTERVENTIONS: 97164- PT Re-evaluation, 97110-Therapeutic exercises, 97530-  Therapeutic activity, 97112- Neuromuscular re-education, 97535- Self Care, 02859- Manual therapy, [phone]- Gait training, [phone]- Orthotic Fit/training, [phone]- Canalith repositioning, [id number]- Aquatic Therapy, [phone]- Splinting, Patient/Family education, Balance training, Stair training, Taping, Dry Needling, Joint mobilization, Joint manipulation, Spinal manipulation, Spinal mobilization, Scar mobilization, and DME instructions.   PLAN FOR NEXT SESSION: progress with Dr Danetta RCR protocol   Delon Aquas, PTA 12/26/23 6:12 PM [facility] [address] [place], [place], [address] Phone: [phone]   Fax:  [phone]

## 2023-12-30 ENCOUNTER — Ambulatory Visit (HOSPITAL_BASED_OUTPATIENT_CLINIC_OR_DEPARTMENT_OTHER): Payer: Managed Care, Other (non HMO) | Admitting: Physical Therapy

## 2024-01-03 ENCOUNTER — Encounter (HOSPITAL_BASED_OUTPATIENT_CLINIC_OR_DEPARTMENT_OTHER): Payer: Self-pay | Admitting: Physical Therapy

## 2024-01-03 ENCOUNTER — Telehealth: Payer: Self-pay

## 2024-01-03 ENCOUNTER — Ambulatory Visit (HOSPITAL_BASED_OUTPATIENT_CLINIC_OR_DEPARTMENT_OTHER): Payer: Managed Care, Other (non HMO) | Admitting: Physical Therapy

## 2024-01-03 DIAGNOSIS — M25512 Pain in left shoulder: Secondary | ICD-10-CM

## 2024-01-03 DIAGNOSIS — M6281 Muscle weakness (generalized): Secondary | ICD-10-CM

## 2024-01-03 DIAGNOSIS — S42252A Displaced fracture of greater tuberosity of left humerus, initial encounter for closed fracture: Secondary | ICD-10-CM

## 2024-01-03 DIAGNOSIS — M25612 Stiffness of left shoulder, not elsewhere classified: Secondary | ICD-10-CM

## 2024-01-03 MED ORDER — LEVETIRACETAM 750 MG PO TABS: 750.0000 mg | ORAL_TABLET | Freq: Every day | ORAL | 1 refills | Status: DC

## 2024-01-03 NOTE — Telephone Encounter (Signed)
 Copied from CRM [id number]. Topic: Clinical - Medication Question >> Jan 03, 2024 11:40 AM Howard Pena wrote: Reason for CRM: Patient would like to know how long it would take authorization for levETIRAcetam  (KEPPRA ) 750 MG tablet to be sent to pharmacy / please call [phone]

## 2024-01-03 NOTE — Telephone Encounter (Signed)
  Just called pt to let him know to come to his appointment on 2.19.25 in order to get refills on requested meds.Aaron Aas

## 2024-01-03 NOTE — Addendum Note (Signed)
 Addended by: Dorothe Gaster on: 01/03/2024 04:29 PM   Modules accepted: Orders

## 2024-01-03 NOTE — Telephone Encounter (Signed)
 Copied from CRM [id number]. Topic: Clinical - Medication Question >> Jan 03, 2024 11:44 AM Isabell A wrote: Reason for CRM: Patient states  pharmacy told him levETIRAcetam  (KEPPRA ) 750 MG tablet would not be dispensed until his next physical visit, he would like to know what else can be prescribed in the mean time.

## 2024-01-03 NOTE — Therapy (Signed)
 OUTPATIENT PHYSICAL THERAPY TREATMENT   Patient Name: Howard Pena MRN: [id number] DOB:1996-11-02, 28 y.o., male Today's Date: 01/03/2024  END OF SESSION:  PT End of Session - 01/03/24 1442     Visit Number 10    Number of Visits 24    Date for PT Re-Evaluation 03/03/24    Authorization Type [employer]    PT Start Time 1442    PT Stop Time 1525    PT Time Calculation (min) 43 min    Activity Tolerance Patient tolerated treatment well;No increased pain    Behavior During Therapy WFL for tasks assessed/performed               Past Medical History:  Diagnosis Date   Constitutional growth delay    Goiter    Physical growth delay    Poor appetite    Puberty delay    Seizures (HCC)    Past Surgical History:  Procedure Laterality Date   HAND SURGERY Right    has screw in the right wrist/thumb area   ORIF HUMERUS FRACTURE Left 10/09/2023   Procedure: LEFT OPEN REDUCTION INTERNAL FIXATION (ORIF) PROXIMAL HUMERUS FRACTURE;  Surgeon: Wilhelmenia Harada, MD;  Location: [facility] OR;  Service: Orthopedics;  Laterality: Left;   right ear surgery     Patient Active Problem List   Diagnosis Date Noted   Closed displaced fracture of greater tuberosity of left humerus 10/09/2023   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   Frequent bowel movements 11/17/2022   Sleep deprivation seizure (HCC) 04/14/2020   Convulsion (HCC) 03/30/2020   Shifting sleep-work schedule, affecting sleep 02/07/2020   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   Peritonsillar abscess 01/28/2020   Physical growth delay    Constitutional growth delay    Goiter    Poor appetite    Puberty delay    Lack of expected normal physiological development in childhood 04/21/2011   Goiter 04/21/2011   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, not elsewhere classified 04/21/2011    PCP: Winthrop Hawks NP  REFERRING PROVIDER: Wilhelmenia Harada, MD  REFERRING DIAG:  S42.252A (ICD-10-CM) - Displaced fracture of greater  tuberosity of left humerus, initial encounter for closed fracture    post op RCR  THERAPY DIAG:  Left shoulder pain, unspecified chronicity  Stiffness of left shoulder, not elsewhere classified  Muscle weakness (generalized)  Displaced fracture of greater tuberosity of left humerus, initial encounter for closed fracture  Rationale for Evaluation and Treatment: Rehabilitation  ONSET DATE: Post op 11/30/23 RCR repair  Days since surgery: 4    SUBJECTIVE:  SUBJECTIVE STATEMENT: Pt reports his shoulder has been hurting a little bit right at top of shoulder. Shoulder keeps popping when trying to change positions but its not painful.    Hand dominance: Right  PERTINENT HISTORY: L shoulder arthroscopic rotator cuff repair, subacromial decompression, and limited debridement on 11/30/23 s/p Left open reduction internal fixation greater tuberosity fracture on 10/09/2023  PAIN:  Are you having pain? No: NPRS scale: 0/10  Pain location: L shoulder Pain description:  Aggravating factors: move quickly, sitting in one position for long period of time Relieving factors: meds   PRECAUTIONS: Shoulder and Other: Follow RCR protocol  RED FLAGS: None   WEIGHT BEARING RESTRICTIONS: Yes NWB LUE  FALLS:  Has patient fallen in last 6 months? Yes. Number of falls 5-10  OCCUPATION: [employer]   PLOF: Independent  PATIENT GOALS: return to normal activity, play with son, return to work   OBJECTIVE: (objective measures from initial evaluation unless otherwise dated)  UPPER EXTREMITY ROM:    Passive ROM Left eval Left  12/21/23 Left 01/03/24  Shoulder flexion 75 ~105 126  Shoulder extension      Shoulder abduction 50 60 80  Shoulder adduction      Shoulder internal rotation      Shoulder  external rotation 0 5 15  Elbow flexion      Elbow extension      Wrist flexion      Wrist extension      Wrist ulnar deviation      Wrist radial deviation      Wrist pronation      Wrist supination      (Blank rows = not tested) *=pain/symptoms  TODAY's TREATMENT:  01/03/24 PROM flexion, abd, ER to tissue tolerance, no pain, and within protocol ranges with holds at end range, frequent cueing for relaxation   12/26/23 Therapeutic exercise: LUE Pendulums x10 each cw and ccw,  front to back motion (with LE in wide stance and staggered stance)  Scap squeeze x 3sec x 10 L levator stretch x 15s;  R lateral neck flexion stretch x 15s L elbow flex/ext x 10 in standing L wrist pronation/ supination x 10 (seated)   Manual: PROM L shoulder flexion, abdct (in scapular plane) and ER to tissue tolerance, no pain, and within protocol ranges.   STM to Lt periscapular muscles/ thoracic paraspinals/ pec major with pt in supine, arm supported by pillow  PATIENT EDUCATION:  Education details:   rehab protocol  Person educated: Patient Education method: Explanation,  verbal cues Education comprehension: verbalized understanding, returned demonstration, verbal cues required  HOME EXERCISE PROGRAM: Access Code: [email or web address] URL: [email or web address]    ASSESSMENT:  CLINICAL IMPRESSION: Continued with PROM within protocol limitations. ROM improving well with PROM and patient requires frequent cueing for relaxation. Patient will continue to benefit from physical therapy in order to improve function and reduce impairment.     OBJECTIVE IMPAIRMENTS: decreased activity tolerance, decreased balance, decreased endurance, decreased mobility, decreased ROM, decreased strength, hypomobility, increased muscle spasms, impaired flexibility, improper body mechanics, postural dysfunction, and pain.   ACTIVITY LIMITATIONS: carrying, lifting, bending, standing, bathing, toileting, dressing, reach  over head, hygiene/grooming, locomotion level, and caring for others  PARTICIPATION LIMITATIONS: meal prep, cleaning, laundry, driving, shopping, community activity, occupation, and yard work  PERSONAL FACTORS: Time since onset of injury/illness/exacerbation and 1 comorbidity: hx seizures  are also affecting patient's functional outcome.   REHAB POTENTIAL: Good  CLINICAL DECISION MAKING: Stable/uncomplicated  EVALUATION COMPLEXITY: Low  GOALS: Goals reviewed with patient? Yes  SHORT TERM GOALS: Target date: 01/15/2024    Patient will be independent with HEP in order to improve functional outcomes. Baseline: Goal status: INITIAL  2. Pt will be able to demonstrate at least ability to perform PROM to protocol limits without pain in order to demonstrate functional improvement in UE function with RC repair protocol.  Baseline:  Goal status: INITIAL  3.  Pt will be able to demonstrate at least 160 flexion and ABD AAROM in order to demonstrate functional improvement in UE ROM for progression to next phase of rehab. Baseline:  Goal status: INITIAL   LONG TERM GOALS: Target date: 02/26/2024     Pt  will become independent with final HEP in order to demonstrate synthesis of PT education. Baseline:  Goal status: INITIAL  2.  Patient will improve FOTO score to predicted outcomes in order to indicate improved tolerance to activity. Baseline: Goal status: INITIAL  3.  Pt will be able to reach [facility] and carry/hold >3 lbs in order to demonstrate functional improvement in L UE strength for return to light OH activity.  Baseline:  Goal status: INITIAL  4.  Pt will be able to demonstrate full AROM of the L shoulder  in order to demonstrate functional improvement in UE for self-care and house hold duties.  Baseline:  Goal status: INITIAL  5.  Patient will demonstrate grade of 5/5 MMT grade in all tested musculature as evidence of improved strength to assist with lifting at  home. Baseline:  Goal status: INITIAL    PLAN:  PT FREQUENCY: 1-2x/week  PT DURATION: 12 weeks  PLANNED INTERVENTIONS: 97164- PT Re-evaluation, 97110-Therapeutic exercises, 97530- Therapeutic activity, 97112- Neuromuscular re-education, 97535- Self Care, 16109- Manual therapy, [phone]- Gait training, [phone]- Orthotic Fit/training, [phone]- Canalith repositioning, [id number]- Aquatic Therapy, [phone]- Splinting, Patient/Family education, Balance training, Stair training, Taping, Dry Needling, Joint mobilization, Joint manipulation, Spinal manipulation, Spinal mobilization, Scar mobilization, and DME instructions.   PLAN FOR NEXT SESSION: progress with Dr Verline Glow RCR protocol    Beather Liming, PT 01/03/2024, 3:31 PM  [facility] [address] [place], [place], [address] Phone: [phone]   Fax:  [phone]

## 2024-01-03 NOTE — Telephone Encounter (Signed)
 I will cover patient until his appointment on February 19 if he does not make that appointment I will offer no further refills

## 2024-01-05 NOTE — Telephone Encounter (Signed)
Contacted pt to inform of refill sent to pharmacy. Also made pt aware of upcoming appointment. No further concerns or questions.

## 2024-01-06 ENCOUNTER — Encounter (HOSPITAL_BASED_OUTPATIENT_CLINIC_OR_DEPARTMENT_OTHER): Payer: Self-pay | Admitting: Physical Therapy

## 2024-01-06 ENCOUNTER — Ambulatory Visit (HOSPITAL_BASED_OUTPATIENT_CLINIC_OR_DEPARTMENT_OTHER): Payer: Managed Care, Other (non HMO) | Admitting: Physical Therapy

## 2024-01-06 DIAGNOSIS — S42252A Displaced fracture of greater tuberosity of left humerus, initial encounter for closed fracture: Secondary | ICD-10-CM

## 2024-01-06 DIAGNOSIS — M25512 Pain in left shoulder: Secondary | ICD-10-CM | POA: Diagnosis not present

## 2024-01-06 DIAGNOSIS — G8929 Other chronic pain: Secondary | ICD-10-CM

## 2024-01-06 DIAGNOSIS — M25611 Stiffness of right shoulder, not elsewhere classified: Secondary | ICD-10-CM

## 2024-01-06 DIAGNOSIS — M25612 Stiffness of left shoulder, not elsewhere classified: Secondary | ICD-10-CM

## 2024-01-06 DIAGNOSIS — M6281 Muscle weakness (generalized): Secondary | ICD-10-CM

## 2024-01-06 NOTE — Therapy (Signed)
OUTPATIENT PHYSICAL THERAPY TREATMENT   Patient Name: Howard Pena MRN: [id number] DOB:[birth year], 28 y.o., male Today's Date: 01/06/2024  END OF SESSION:  PT End of Session - 01/06/24 0829     Visit Number 11    Number of Visits 24    Date for PT Re-Evaluation 03/03/24    Authorization Type [employer]    PT Start Time 0830    PT Stop Time 0908    PT Time Calculation (min) 38 min    Activity Tolerance Patient tolerated treatment well;No increased pain    Behavior During Therapy WFL for tasks assessed/performed               Past Medical History:  Diagnosis Date   Constitutional growth delay    Goiter    Physical growth delay    Poor appetite    Puberty delay    Seizures (HCC)    Past Surgical History:  Procedure Laterality Date   HAND SURGERY Right    has screw in the right wrist/thumb area   ORIF HUMERUS FRACTURE Left 10/09/2023   Procedure: LEFT OPEN REDUCTION INTERNAL FIXATION (ORIF) PROXIMAL HUMERUS FRACTURE;  Surgeon: Huel Cote, MD;  Location: [facility] OR;  Service: Orthopedics;  Laterality: Left;   right ear surgery     Patient Active Problem List   Diagnosis Date Noted   Closed displaced fracture of greater tuberosity of left humerus 10/09/2023   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   Frequent bowel movements 11/17/2022   Sleep deprivation seizure (HCC) 04/14/2020   Convulsion (HCC) 03/30/2020   Shifting sleep-work schedule, affecting sleep 02/07/2020   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   Peritonsillar abscess 01/28/2020   Physical growth delay    Constitutional growth delay    Goiter    Poor appetite    Puberty delay    Lack of expected normal physiological development in childhood 04/21/2011   Goiter 04/21/2011   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, not elsewhere classified 04/21/2011    PCP: Mordecai Maes NP  REFERRING PROVIDER: Huel Cote, MD  REFERRING DIAG:  S42.252A (ICD-10-CM) - Displaced fracture of greater  tuberosity of left humerus, initial encounter for closed fracture    post op RCR  THERAPY DIAG:  Left shoulder pain, unspecified chronicity  Stiffness of left shoulder, not elsewhere classified  Muscle weakness (generalized)  Displaced fracture of greater tuberosity of left humerus, initial encounter for closed fracture  Chronic right shoulder pain  Stiffness of right shoulder, not elsewhere classified  Rationale for Evaluation and Treatment: Rehabilitation  ONSET DATE: Post op 11/30/23 RCR repair    SUBJECTIVE:  SUBJECTIVE STATEMENT: Pt reports been feeling better. Shoulder doing alright.    Hand dominance: Right  PERTINENT HISTORY: L shoulder arthroscopic rotator cuff repair, subacromial decompression, and limited debridement on 11/30/23 s/p Left open reduction internal fixation greater tuberosity fracture on 10/09/2023  PAIN:  Are you having pain? No: NPRS scale: 0/10  Pain location: L shoulder Pain description:  Aggravating factors: move quickly, sitting in one position for long period of time Relieving factors: meds   PRECAUTIONS: Shoulder and Other: Follow RCR protocol  RED FLAGS: None   WEIGHT BEARING RESTRICTIONS: Yes NWB LUE  FALLS:  Has patient fallen in last 6 months? Yes. Number of falls 5-10  OCCUPATION: [name]   PLOF: Independent  PATIENT GOALS: return to normal activity, play with son, return to work   OBJECTIVE: (objective measures from initial evaluation unless otherwise dated)  UPPER EXTREMITY ROM:    Passive ROM Left eval Left  12/21/23 Left 01/03/24 Left 01/06/24  Shoulder flexion 75 ~105 126 130  Shoulder extension       Shoulder abduction 50 60 80 80  Shoulder adduction       Shoulder internal rotation       Shoulder external  rotation 0 5 15 25   Elbow flexion       Elbow extension       Wrist flexion       Wrist extension       Wrist ulnar deviation       Wrist radial deviation       Wrist pronation       Wrist supination       (Blank rows = not tested) *=pain/symptoms  TODAY's TREATMENT:  01/06/24 Manual: PROM flexion, abd, ER to tissue tolerance, no pain, and within protocol ranges with holds at end range, frequent cueing for relaxation Grade I-II GH inferior glide, Grade I-II GH AP glide STM to L pec Supine scap retractions 2x 20  01/03/24 PROM flexion, abd, ER to tissue tolerance, no pain, and within protocol ranges with holds at end range, frequent cueing for relaxation   12/26/23 Therapeutic exercise: LUE Pendulums x10 each cw and ccw,  front to back motion (with LE in wide stance and staggered stance)  Scap squeeze x 3sec x 10 L levator stretch x 15s;  R lateral neck flexion stretch x 15s L elbow flex/ext x 10 in standing L wrist pronation/ supination x 10 (seated)   Manual: PROM L shoulder flexion, abdct (in scapular plane) and ER to tissue tolerance, no pain, and within protocol ranges.   STM to Lt periscapular muscles/ thoracic paraspinals/ pec major with pt in supine, arm supported by pillow  PATIENT EDUCATION:  Education details:   rehab protocol  Person educated: Patient Education method: Explanation,  verbal cues Education comprehension: verbalized understanding, returned demonstration, verbal cues required  HOME EXERCISE PROGRAM: Access Code: [email or web address] URL: [email or web address]    ASSESSMENT:  CLINICAL IMPRESSION: Patient now 5 weeks post op. Continued with PROM within protocol limitations. ROM improving well with PROM and patient requires frequent cueing for relaxation. Hyperactive L pec which decreases with STM. ROM at 110 flexion at beginning of session and improves to 130 following treatment. Patient will continue to benefit from physical therapy in order to  improve function and reduce impairment.     OBJECTIVE IMPAIRMENTS: decreased activity tolerance, decreased balance, decreased endurance, decreased mobility, decreased ROM, decreased strength, hypomobility, increased muscle spasms, impaired flexibility, improper body mechanics, postural dysfunction, and pain.   ACTIVITY LIMITATIONS:  carrying, lifting, bending, standing, bathing, toileting, dressing, reach over head, hygiene/grooming, locomotion level, and caring for others  PARTICIPATION LIMITATIONS: meal prep, cleaning, laundry, driving, shopping, community activity, occupation, and yard work  PERSONAL FACTORS: Time since onset of injury/illness/exacerbation and 1 comorbidity: hx seizures  are also affecting patient's functional outcome.   REHAB POTENTIAL: Good  CLINICAL DECISION MAKING: Stable/uncomplicated  EVALUATION COMPLEXITY: Low   GOALS: Goals reviewed with patient? Yes  SHORT TERM GOALS: Target date: 01/15/2024    Patient will be independent with HEP in order to improve functional outcomes. Baseline: Goal status: INITIAL  2. Pt will be able to demonstrate at least ability to perform PROM to protocol limits without pain in order to demonstrate functional improvement in UE function with RC repair protocol.  Baseline:  Goal status: INITIAL  3.  Pt will be able to demonstrate at least 160 flexion and ABD AAROM in order to demonstrate functional improvement in UE ROM for progression to next phase of rehab. Baseline:  Goal status: INITIAL   LONG TERM GOALS: Target date: 02/26/2024     Pt  will become independent with final HEP in order to demonstrate synthesis of PT education. Baseline:  Goal status: INITIAL  2.  Patient will improve FOTO score to predicted outcomes in order to indicate improved tolerance to activity. Baseline: Goal status: INITIAL  3.  Pt will be able to reach [facility] and carry/hold >3 lbs in order to demonstrate functional improvement in L UE strength  for return to light OH activity.  Baseline:  Goal status: INITIAL  4.  Pt will be able to demonstrate full AROM of the L shoulder  in order to demonstrate functional improvement in UE for self-care and house hold duties.  Baseline:  Goal status: INITIAL  5.  Patient will demonstrate grade of 5/5 MMT grade in all tested musculature as evidence of improved strength to assist with lifting at home. Baseline:  Goal status: INITIAL    PLAN:  PT FREQUENCY: 1-2x/week  PT DURATION: 12 weeks  PLANNED INTERVENTIONS: 97164- PT Re-evaluation, 97110-Therapeutic exercises, 97530- Therapeutic activity, 97112- Neuromuscular re-education, 97535- Self Care, 16109- Manual therapy, [phone]- Gait training, [phone]- Orthotic Fit/training, [phone]- Canalith repositioning, [id number]- Aquatic Therapy, [phone]- Splinting, Patient/Family education, Balance training, Stair training, Taping, Dry Needling, Joint mobilization, Joint manipulation, Spinal manipulation, Spinal mobilization, Scar mobilization, and DME instructions.   PLAN FOR NEXT SESSION: progress with Dr Serena Croissant RCR protocol    Wyman Songster, PT 01/06/2024, 9:08 AM  [facility] [address] [place], [place], [address] Phone: [phone]   Fax:  [phone]

## 2024-01-08 ENCOUNTER — Encounter (HOSPITAL_BASED_OUTPATIENT_CLINIC_OR_DEPARTMENT_OTHER): Payer: Self-pay | Admitting: Physical Therapy

## 2024-01-08 ENCOUNTER — Ambulatory Visit (HOSPITAL_BASED_OUTPATIENT_CLINIC_OR_DEPARTMENT_OTHER): Payer: Managed Care, Other (non HMO) | Admitting: Physical Therapy

## 2024-01-08 DIAGNOSIS — M25612 Stiffness of left shoulder, not elsewhere classified: Secondary | ICD-10-CM

## 2024-01-08 DIAGNOSIS — M25512 Pain in left shoulder: Secondary | ICD-10-CM

## 2024-01-08 DIAGNOSIS — M6281 Muscle weakness (generalized): Secondary | ICD-10-CM

## 2024-01-08 NOTE — Therapy (Signed)
OUTPATIENT PHYSICAL THERAPY TREATMENT   Patient Name: Howard Pena MRN: [id number] DOB:Feb 14, 1996, 28 y.o., male Today's Date: 01/08/2024  END OF SESSION:  PT End of Session - 01/08/24 1400     Visit Number 12    Number of Visits 24    Date for PT Re-Evaluation 03/03/24    Authorization Type [employer]    PT Start Time 1400    PT Stop Time 1440    PT Time Calculation (min) 40 min    Activity Tolerance Patient tolerated treatment well;No increased pain    Behavior During Therapy WFL for tasks assessed/performed                Past Medical History:  Diagnosis Date   Constitutional growth delay    Goiter    Physical growth delay    Poor appetite    Puberty delay    Seizures (HCC)    Past Surgical History:  Procedure Laterality Date   HAND SURGERY Right    has screw in the right wrist/thumb area   ORIF HUMERUS FRACTURE Left 10/09/2023   Procedure: LEFT OPEN REDUCTION INTERNAL FIXATION (ORIF) PROXIMAL HUMERUS FRACTURE;  Surgeon: Huel Cote, MD;  Location: [facility] OR;  Service: Orthopedics;  Laterality: Left;   right ear surgery     Patient Active Problem List   Diagnosis Date Noted   Closed displaced fracture of greater tuberosity of left humerus 10/09/2023   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   Frequent bowel movements 11/17/2022   Sleep deprivation seizure (HCC) 04/14/2020   Convulsion (HCC) 03/30/2020   Shifting sleep-work schedule, affecting sleep 02/07/2020   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   Peritonsillar abscess 01/28/2020   Physical growth delay    Constitutional growth delay    Goiter    Poor appetite    Puberty delay    Lack of expected normal physiological development in childhood 04/21/2011   Goiter 04/21/2011   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 Shoulder doing alright.    Hand dominance: Right  PERTINENT HISTORY: L shoulder arthroscopic rotator cuff repair, subacromial decompression, and limited debridement on 11/30/23 s/p Left open reduction internal fixation greater tuberosity fracture on 10/09/2023  PAIN:  Are you having pain? No: NPRS scale: 0/10  Pain location: L shoulder Pain description:  Aggravating factors: move quickly, sitting in one position for long period of time Relieving factors: meds   PRECAUTIONS: Shoulder and Other: Follow RCR protocol  RED FLAGS: None   WEIGHT BEARING RESTRICTIONS: Yes NWB LUE  FALLS:  Has patient fallen in last 6 months? Yes. Number of falls 5-10  OCCUPATION: [name]   PLOF: Independent  PATIENT GOALS: return to normal activity, play with son, return to work   OBJECTIVE: (objective measures from initial evaluation unless otherwise dated)  UPPER EXTREMITY ROM:    Passive ROM Left eval Left  12/21/23 Left 01/03/24 Left 01/06/24  Shoulder flexion 75 ~105 126 130  Shoulder extension       Shoulder abduction 50 60 80 80  Shoulder adduction       Shoulder internal rotation       Shoulder external rotation 0 5 15 25   Elbow flexion       Elbow extension       Wrist flexion       Wrist extension       Wrist ulnar deviation       Wrist radial deviation       Wrist pronation        Wrist supination       (Blank rows = not tested) *=pain/symptoms  TODAY's TREATMENT:  1/20  Manual: PROM flexion, abd, ER to tissue tolerance, no pain, and within protocol ranges with holds at end range, frequent cueing for relaxation  Grade I-II GHJ inf, Grade I-II GHJ post mob STM to L pec, L UT Pec doorway (arm low) 30s 3x  Bent over scap retraction 20x (heavy VC and TC needed to reduce shrug)    01/06/24 Manual: PROM flexion, abd, ER to tissue tolerance, no pain, and within protocol ranges with holds at end range, frequent cueing for relaxation Grade I-II GH inferior glide, Grade I-II GH AP glide STM to L pec Supine scap retractions 2x 20  01/03/24 PROM flexion, abd, ER to tissue tolerance, no pain, and within protocol ranges with holds at end range, frequent cueing for relaxation   12/26/23 Therapeutic exercise: LUE Pendulums x10 each cw and ccw,  front to back motion (with LE in wide stance and staggered stance)  Scap squeeze x 3sec x 10 L levator stretch x 15s;  R lateral neck flexion stretch x 15s L elbow flex/ext x 10 in standing L wrist pronation/ supination x 10 (seated)   Manual: PROM L shoulder flexion, abdct (in scapular plane) and ER to tissue tolerance, no pain, and within protocol ranges.   STM to Lt periscapular muscles/ thoracic paraspinals/ pec major with pt in supine, arm supported by pillow  PATIENT EDUCATION:  Education details:   rehab protocol  Person educated: Patient Education method: Explanation,  verbal cues Education comprehension: verbalized understanding, returned demonstration, verbal cues required  HOME EXERCISE PROGRAM: Access Code: [email or web address] URL: [email or web address]    ASSESSMENT:  CLINICAL IMPRESSION: Patient 5 weeks post op. Pt flexion improves to 130 following manual, ER to 25, and ABD is the most stiff at 75. Pt HEP not updated but was able to perform low, gentle  pec stretch to improve anterior shoulder  extensibility. Pt with difficulty controlling scap retraction with signficant UT compensation. Heavy TC and VC requires for positioning and scap setting position. Plan for pt to continue with protocol. Pt to see MD prior to next visit for progression of protocol. Pt will be 6 wks at that time. Consider AAROM as allowed and progressive ABD stretching. Patient will continue to benefit from physical therapy in order to improve function and reduce impairment.     OBJECTIVE IMPAIRMENTS: decreased activity tolerance, decreased balance, decreased endurance, decreased mobility, decreased ROM, decreased strength, hypomobility, increased muscle spasms, impaired flexibility, improper body mechanics, postural dysfunction, and pain.   ACTIVITY LIMITATIONS: carrying, lifting, bending, standing, bathing, toileting, dressing, reach over head, hygiene/grooming, locomotion level, and caring for others  PARTICIPATION LIMITATIONS: meal prep, cleaning, laundry, driving, shopping, community activity, occupation, and yard work  PERSONAL FACTORS: Time since onset of injury/illness/exacerbation and 1 comorbidity: hx seizures  are also affecting patient's functional outcome.   REHAB POTENTIAL: Good  CLINICAL DECISION MAKING: Stable/uncomplicated  EVALUATION COMPLEXITY: Low   GOALS: Goals reviewed with patient? Yes  SHORT TERM GOALS: Target date: 01/15/2024    Patient will be independent with HEP in order to improve functional outcomes. Baseline: Goal status: INITIAL  2. Pt will be able to demonstrate at least ability to perform PROM to protocol limits without pain in order to demonstrate functional improvement in UE function with RC repair protocol.  Baseline:  Goal status: INITIAL  3.  Pt will be able to demonstrate at least 160 flexion and ABD AAROM in order to demonstrate functional improvement in UE ROM for progression to next phase of rehab. Baseline:  Goal status: INITIAL   LONG TERM GOALS: Target  date: 02/26/2024     Pt  will become independent with final HEP in order to demonstrate synthesis of PT education. Baseline:  Goal status: INITIAL  2.  Patient will improve FOTO score to predicted outcomes in order to indicate improved tolerance to activity. Baseline: Goal status: INITIAL  3.  Pt will be able to reach [facility] and carry/hold >3 lbs in order to demonstrate functional improvement in L UE strength for return to light OH activity.  Baseline:  Goal status: INITIAL  4.  Pt will be able to demonstrate full AROM of the L shoulder  in order to demonstrate functional improvement in UE for self-care and house hold duties.  Baseline:  Goal status: INITIAL  5.  Patient will demonstrate grade of 5/5 MMT grade in all tested musculature as evidence of improved strength to assist with lifting at home. Baseline:  Goal status: INITIAL    PLAN:  PT FREQUENCY: 1-2x/week  PT DURATION: 12 weeks  PLANNED INTERVENTIONS: 97164- PT Re-evaluation, 97110-Therapeutic exercises, 97530- Therapeutic activity, 97112- Neuromuscular re-education, 97535- Self Care, 16109- Manual therapy, [phone]- Gait training, [phone]- Orthotic Fit/training, [phone]- Canalith repositioning, [id number]- Aquatic Therapy, [phone]- Splinting, Patient/Family education, Balance training, Stair training, Taping, Dry Needling, Joint mobilization, Joint manipulation, Spinal manipulation, Spinal mobilization, Scar mobilization, and DME instructions.   PLAN FOR NEXT SESSION: progress with Dr Serena Croissant RCR protocol    Zebedee Iba, PT 01/08/2024, 2:44 PM  [facility] [address] [place], [place], [address] Phone: [phone]   Fax:  [phone]

## 2024-01-10 ENCOUNTER — Ambulatory Visit (HOSPITAL_BASED_OUTPATIENT_CLINIC_OR_DEPARTMENT_OTHER): Payer: Managed Care, Other (non HMO) | Admitting: Physical Therapy

## 2024-01-10 ENCOUNTER — Ambulatory Visit (HOSPITAL_BASED_OUTPATIENT_CLINIC_OR_DEPARTMENT_OTHER): Payer: Managed Care, Other (non HMO) | Admitting: Orthopaedic Surgery

## 2024-01-10 DIAGNOSIS — M25612 Stiffness of left shoulder, not elsewhere classified: Secondary | ICD-10-CM

## 2024-01-10 DIAGNOSIS — S46012A Strain of muscle(s) and tendon(s) of the rotator cuff of left shoulder, initial encounter: Secondary | ICD-10-CM

## 2024-01-10 DIAGNOSIS — M25512 Pain in left shoulder: Secondary | ICD-10-CM | POA: Diagnosis not present

## 2024-01-10 DIAGNOSIS — M6281 Muscle weakness (generalized): Secondary | ICD-10-CM

## 2024-01-10 NOTE — Progress Notes (Signed)
Post Operative Evaluation      Procedure/Date of Surgery: Left shoulder arthroscopic rotator cuff repair with limited debridement and decompression 11/30/2023   Interval History:    Patient presents today 6 weeks status post the above procedure.  Overall he states that recovery is going well.  He has been delaying active range of motion as I have advised.   PMH/PSH/Family History/Social History/Meds/Allergies:         Past Medical History:  Diagnosis Date   Constitutional growth delay     Goiter     Physical growth delay     Poor appetite     Puberty delay     Seizures (HCC)               Past Surgical History:  Procedure Laterality Date   HAND SURGERY Right      has screw in the right wrist/thumb area   ORIF HUMERUS FRACTURE Left 10/09/2023    Procedure: LEFT OPEN REDUCTION INTERNAL FIXATION (ORIF) PROXIMAL HUMERUS FRACTURE;  Surgeon: Huel Cote, MD;  Location: [facility] OR;  Service: Orthopedics;  Laterality: Left;   right ear surgery            Social History         Socioeconomic History   Marital status: Single      Spouse name: Not on file   Number of children: 1   Years of education: Not on file   Highest education level: Not on file  Occupational History   Not on file  Tobacco Use   Smoking status: Former      Types: Cigars, E-cigarettes   Smokeless tobacco: Never   Tobacco comments:      quit 2 months ago  Vaping Use   Vaping status: Every Day   Substances: Nicotine, Flavoring  Substance and Sexual Activity   Alcohol use: Yes      Comment: on the weekends sometimes. depends on the occassion beers   Drug use: No   Sexual activity: Yes      Birth control/protection: None  Other Topics Concern   Not on file  Social History Narrative    Akbar (3)    Is engaged to [employer]    Fulltime: order selector at [employer]              Hobbies: none    Social Drivers of Manufacturing engineer Strain: Not on file  Food  Insecurity: Not on file  Transportation Needs: Not on file  Physical Activity: Not on file  Stress: Not on file  Social Connections: Not on file         Family History  Problem Relation Age of Onset   Thyroid disease Mother     Post-traumatic stress disorder Father     Stomach cancer Other     Cancer Maternal Great-grandmother          not sure of the type        Allergies       Allergies  Allergen Reactions   Bee Venom Swelling   Amoxicillin Other (See Comments)      Did it involve swelling of the face/tongue/throat, SOB, or low BP? Unknown Did it involve sudden or severe rash/hives, skin peeling, or any reaction on the inside of your mouth or nose? Unknown Did you need to seek medical attention at a hospital or doctor's office? Unknown When did it last happen?  pt was a child  If all above answers are "NO", may proceed with cephalosporin use.              Current Outpatient Medications  Medication Sig Dispense Refill   aspirin EC 325 MG tablet Take 1 tablet (325 mg total) by mouth daily. 14 tablet 0   oxyCODONE (ROXICODONE) 5 MG immediate release tablet Take 1 tablet (5 mg total) by mouth every 4 (four) hours as needed for severe pain (pain score 7-10) or breakthrough pain. 30 tablet 0   levETIRAcetam (KEPPRA) 750 MG tablet Take 1 tablet (750 mg total) by mouth daily. 90 tablet 1      No current facility-administered medications for this visit.             Facility-Administered Medications Ordered in Other Visits  Medication Dose Route Frequency Provider Last Rate Last Admin   gadobenate dimeglumine (MULTIHANCE) injection 12 mL  12 mL Intravenous Once PRN Dohmeier, Porfirio Mylar, MD          Imaging Results (Last 48 hours)  No results found.     Review of Systems:   A ROS was performed including pertinent positives and negatives as documented in the HPI.     Musculoskeletal Exam:     There were no vitals taken for this visit.   Left shoulder incisions are  well-appearing with sutures in place without evidence of erythema or drainage.  Patient's left arm immobilized in a sling.  Distal neurosensory exam is intact.   Imaging:     Assessment:   Patient is 6 weeks status post left shoulder rotator cuff repair overall doing well.  At this time given the fact that he has a prior read tear I would like him to hold off on active range of motion for an additional 2 weeks.  I will plan to see him back in 4 weeks for reassessment.  He may return to work light duty doing standing responsibilities only   Plan :     -Return to clinic in 4 weeks for reassessment         I personally saw and evaluated the patient, and participated in the management and treatment plan.

## 2024-01-10 NOTE — Therapy (Signed)
OUTPATIENT PHYSICAL THERAPY TREATMENT   Patient Name: Howard Pena MRN: [id number] DOB:Oct 21, 1996, 28 y.o., male Today's Date: 01/10/2024  END OF SESSION:  PT End of Session - 01/10/24 1518     Visit Number 13    Number of Visits 24    Date for PT Re-Evaluation 03/03/24    Authorization Type [employer]    PT Start Time 1517    PT Stop Time 1555    PT Time Calculation (min) 38 min    Activity Tolerance Patient tolerated treatment well;No increased pain    Behavior During Therapy WFL for tasks assessed/performed                Past Medical History:  Diagnosis Date   Constitutional growth delay    Goiter    Physical growth delay    Poor appetite    Puberty delay    Seizures (HCC)    Past Surgical History:  Procedure Laterality Date   HAND SURGERY Right    has screw in the right wrist/thumb area   ORIF HUMERUS FRACTURE Left 10/09/2023   Procedure: LEFT OPEN REDUCTION INTERNAL FIXATION (ORIF) PROXIMAL HUMERUS FRACTURE;  Surgeon: Huel Cote, MD;  Location: [facility] OR;  Service: Orthopedics;  Laterality: Left;   right ear surgery     Patient Active Problem List   Diagnosis Date Noted   Closed displaced fracture of greater tuberosity of left humerus 10/09/2023   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   Frequent bowel movements 11/17/2022   Sleep deprivation seizure (HCC) 04/14/2020   Convulsion (HCC) 03/30/2020   Shifting sleep-work schedule, affecting sleep 02/07/2020   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   Peritonsillar abscess 01/28/2020   Physical growth delay    Constitutional growth delay    Goiter    Poor appetite    Puberty delay    Lack of expected normal physiological development in childhood 04/21/2011   Goiter 04/21/2011   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 Shoulder doing alright. MD told him to hold off AROM for another 2 weeks.    Hand dominance: Right  PERTINENT HISTORY: L shoulder arthroscopic rotator cuff repair, subacromial decompression, and limited debridement on 11/30/23 s/p Left open reduction internal fixation greater tuberosity fracture on 10/09/2023  PAIN:  Are you having pain? No: NPRS scale: 0/10  Pain location: L shoulder Pain description:  Aggravating factors: move quickly, sitting in one position for long period of time Relieving factors: meds   PRECAUTIONS: Shoulder and Other: Follow RCR protocol  RED FLAGS: None   WEIGHT BEARING RESTRICTIONS: Yes NWB LUE  FALLS:  Has patient fallen in last 6 months? Yes. Number of falls 5-10  OCCUPATION: [name]   PLOF: Independent  PATIENT GOALS: return to normal activity, play with son, return to work   OBJECTIVE: (objective measures from initial evaluation unless otherwise dated)  UPPER EXTREMITY ROM:    Passive ROM Left eval Left  12/21/23 Left 01/03/24 Left 01/06/24 Left 01/10/24  Shoulder flexion 75 ~105 126 130 142  Shoulder extension        Shoulder abduction 50 60 80 80   Shoulder adduction        Shoulder internal rotation        Shoulder external rotation 0 5 15 25    Elbow flexion        Elbow extension        Wrist flexion        Wrist extension         Wrist ulnar deviation        Wrist radial deviation        Wrist pronation        Wrist supination        (Blank rows = not tested) *=pain/symptoms  TODAY's TREATMENT:  01/10/24 Manual: PROM flexion, abd, ER to tissue tolerance, no pain, and within protocol ranges with holds at end range, frequent cueing for relaxation  Supine AAROM flexion with cane 2 x 10 Supine AAROM ER with cane 2 x 10 Bent over scap retraction 20x  Seated scap retractions 2x 20  1/20  Manual: PROM flexion, abd, ER to tissue tolerance, no pain, and within protocol ranges with holds at end range, frequent cueing for relaxation  Grade I-II GHJ inf, Grade I-II GHJ post mob STM to L pec, L UT Pec doorway (arm low) 30s 3x  Bent over scap retraction 20x (heavy VC and TC needed to reduce shrug)    01/06/24 Manual: PROM flexion, abd, ER to tissue tolerance, no pain, and within protocol ranges with holds at end range, frequent cueing for relaxation Grade I-II GH inferior glide, Grade I-II GH AP glide STM to L pec Supine scap retractions 2x 20  01/03/24 PROM flexion, abd, ER to tissue tolerance, no pain, and within protocol ranges with holds at end range, frequent cueing for relaxation   12/26/23 Therapeutic exercise: LUE Pendulums x10 each cw and ccw,  front to back motion (with LE in wide stance and staggered stance)  Scap squeeze x 3sec x 10 L levator stretch x 15s;  R lateral neck flexion stretch x 15s L elbow flex/ext x 10 in standing L wrist pronation/ supination x 10 (seated)   Manual: PROM L shoulder flexion, abdct (in scapular plane) and ER to tissue tolerance, no pain, and within protocol ranges.   STM to Lt periscapular muscles/ thoracic paraspinals/ pec major with pt in supine, arm supported by  pillow  PATIENT EDUCATION:  Education details:   rehab protocol  Person educated: Patient Education method: Explanation,  verbal cues Education comprehension: verbalized understanding, returned  demonstration, verbal cues required  HOME EXERCISE PROGRAM: Access Code: [email or web address] URL: [email or web address]    ASSESSMENT:  CLINICAL IMPRESSION: Patient demonstrates improving PROM today following manual. Patient now 6 weeks post op. Per MD note to hold off on AROM for another 2 weeks. Began AAROM today which is tolerated well without pain. Patient will continue to benefit from physical therapy in order to improve function and reduce impairment.     OBJECTIVE IMPAIRMENTS: decreased activity tolerance, decreased balance, decreased endurance, decreased mobility, decreased ROM, decreased strength, hypomobility, increased muscle spasms, impaired flexibility, improper body mechanics, postural dysfunction, and pain.   ACTIVITY LIMITATIONS: carrying, lifting, bending, standing, bathing, toileting, dressing, reach over head, hygiene/grooming, locomotion level, and caring for others  PARTICIPATION LIMITATIONS: meal prep, cleaning, laundry, driving, shopping, community activity, occupation, and yard work  PERSONAL FACTORS: Time since onset of injury/illness/exacerbation and 1 comorbidity: hx seizures  are also affecting patient's functional outcome.   REHAB POTENTIAL: Good  CLINICAL DECISION MAKING: Stable/uncomplicated  EVALUATION COMPLEXITY: Low   GOALS: Goals reviewed with patient? Yes  SHORT TERM GOALS: Target date: 01/15/2024    Patient will be independent with HEP in order to improve functional outcomes. Baseline: Goal status: INITIAL  2. Pt will be able to demonstrate at least ability to perform PROM to protocol limits without pain in order to demonstrate functional improvement in UE function with RC repair protocol.  Baseline:  Goal status: INITIAL  3.  Pt will be able to demonstrate at least 160 flexion and ABD AAROM in order to demonstrate functional improvement in UE ROM for progression to next phase of rehab. Baseline:  Goal status: INITIAL   LONG  TERM GOALS: Target date: 02/26/2024     Pt  will become independent with final HEP in order to demonstrate synthesis of PT education. Baseline:  Goal status: INITIAL  2.  Patient will improve FOTO score to predicted outcomes in order to indicate improved tolerance to activity. Baseline: Goal status: INITIAL  3.  Pt will be able to reach [facility] and carry/hold >3 lbs in order to demonstrate functional improvement in L UE strength for return to light [place] activity.  Baseline:  Goal status: INITIAL  4.  Pt will be able to demonstrate full AROM of the L shoulder  in order to demonstrate functional improvement in UE for self-care and house hold duties.  Baseline:  Goal status: INITIAL  5.  Patient will demonstrate grade of 5/5 MMT grade in all tested musculature as evidence of improved strength to assist with lifting at home. Baseline:  Goal status: INITIAL    PLAN:  PT FREQUENCY: 1-2x/week  PT DURATION: 12 weeks  PLANNED INTERVENTIONS: 97164- PT Re-evaluation, 97110-Therapeutic exercises, 97530- Therapeutic activity, 97112- Neuromuscular re-education, 97535- Self Care, 82956- Manual therapy, [phone]- Gait training, [phone]- Orthotic Fit/training, [phone]- Canalith repositioning, [id number]- Aquatic Therapy, [phone]- Splinting, Patient/Family education, Balance training, Stair training, Taping, Dry Needling, Joint mobilization, Joint manipulation, Spinal manipulation, Spinal mobilization, Scar mobilization, and DME instructions.   PLAN FOR NEXT SESSION: progress with Dr Serena Croissant RCR protocol    Wyman Songster, PT 01/10/2024, 3:19 PM  [facility] [address] [place], [place], [address] Phone: [phone]   Fax:  [phone]

## 2024-01-17 ENCOUNTER — Encounter (HOSPITAL_BASED_OUTPATIENT_CLINIC_OR_DEPARTMENT_OTHER): Payer: Self-pay | Admitting: Physical Therapy

## 2024-01-17 ENCOUNTER — Ambulatory Visit (HOSPITAL_BASED_OUTPATIENT_CLINIC_OR_DEPARTMENT_OTHER): Payer: Managed Care, Other (non HMO) | Admitting: Physical Therapy

## 2024-01-17 DIAGNOSIS — M25512 Pain in left shoulder: Secondary | ICD-10-CM

## 2024-01-17 DIAGNOSIS — M6281 Muscle weakness (generalized): Secondary | ICD-10-CM

## 2024-01-17 DIAGNOSIS — S42252A Displaced fracture of greater tuberosity of left humerus, initial encounter for closed fracture: Secondary | ICD-10-CM

## 2024-01-17 DIAGNOSIS — M25612 Stiffness of left shoulder, not elsewhere classified: Secondary | ICD-10-CM

## 2024-01-17 NOTE — Therapy (Signed)
OUTPATIENT PHYSICAL THERAPY TREATMENT   Patient Name: Howard Pena MRN: [id number] DOB:1996-06-09, 28 y.o., male Today's Date: 01/17/2024  END OF SESSION:  PT End of Session - 01/17/24 1510     Visit Number 14    Number of Visits 24    Date for PT Re-Evaluation 03/03/24    Authorization Type [employer]    PT Start Time 1507    PT Stop Time 1545    PT Time Calculation (min) 38 min    Activity Tolerance Patient tolerated treatment well;No increased pain    Behavior During Therapy WFL for tasks assessed/performed                Past Medical History:  Diagnosis Date   Constitutional growth delay    Goiter    Physical growth delay    Poor appetite    Puberty delay    Seizures (HCC)    Past Surgical History:  Procedure Laterality Date   HAND SURGERY Right    has screw in the right wrist/thumb area   ORIF HUMERUS FRACTURE Left 10/09/2023   Procedure: LEFT OPEN REDUCTION INTERNAL FIXATION (ORIF) PROXIMAL HUMERUS FRACTURE;  Surgeon: Huel Cote, MD;  Location: [facility] OR;  Service: Orthopedics;  Laterality: Left;   right ear surgery     Patient Active Problem List   Diagnosis Date Noted   Closed displaced fracture of greater tuberosity of left humerus 10/09/2023   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   Frequent bowel movements 11/17/2022   Sleep deprivation seizure (HCC) 04/14/2020   Convulsion (HCC) 03/30/2020   Shifting sleep-work schedule, affecting sleep 02/07/2020   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   Peritonsillar abscess 01/28/2020   Physical growth delay    Constitutional growth delay    Goiter    Poor appetite    Puberty delay    Lack of expected normal physiological development in childhood 04/21/2011   Goiter 04/21/2011   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, not elsewhere classified 04/21/2011    PCP: Mordecai Maes NP  REFERRING PROVIDER: Huel Cote, MD  REFERRING DIAG:  S42.252A (ICD-10-CM) - Displaced fracture of greater  tuberosity of left humerus, initial encounter for closed fracture    post op RCR  THERAPY DIAG:  Left shoulder pain, unspecified chronicity  Stiffness of left shoulder, not elsewhere classified  Muscle weakness (generalized)  Displaced fracture of greater tuberosity of left humerus, initial encounter for closed fracture  Rationale for Evaluation and Treatment: Rehabilitation  ONSET DATE: Post op 11/30/23 RCR repair    SUBJECTIVE:  SUBJECTIVE STATEMENT: Pt reports working on HEP. Some stiffness when he wakes up.    Hand dominance: Right  PERTINENT HISTORY: L shoulder arthroscopic rotator cuff repair, subacromial decompression, and limited debridement on 11/30/23 s/p Left open reduction internal fixation greater tuberosity fracture on 10/09/2023  PAIN:  Are you having pain? No: NPRS scale: 0/10  Pain location: L shoulder Pain description:  Aggravating factors: move quickly, sitting in one position for long period of time Relieving factors: meds   PRECAUTIONS: Shoulder and Other: Follow RCR protocol  RED FLAGS: None   WEIGHT BEARING RESTRICTIONS: Yes NWB LUE  FALLS:  Has patient fallen in last 6 months? Yes. Number of falls 5-10  OCCUPATION: [name]   PLOF: Independent  PATIENT GOALS: return to normal activity, play with son, return to work   OBJECTIVE: (objective measures from initial evaluation unless otherwise dated)  UPPER EXTREMITY ROM:    Passive ROM Left eval Left  12/21/23 Left 01/03/24 Left 01/06/24 Left 01/10/24  Shoulder flexion 75 ~105 126 130 142  Shoulder extension        Shoulder abduction 50 60 80 80   Shoulder adduction        Shoulder internal rotation        Shoulder external rotation 0 5 15 25    Elbow flexion        Elbow extension         Wrist flexion        Wrist extension        Wrist ulnar deviation        Wrist radial deviation        Wrist pronation        Wrist supination        (Blank rows = not tested) *=pain/symptoms  TODAY's TREATMENT:  01/17/24 Manual: PROM flexion, abd, ER to tissue tolerance, no pain, and within protocol ranges with holds at end range, frequent cueing for relaxation; STM subscap  Supine AAROM flexion with cane 2 x 10 Supine AAROM ER with cane 2 x 10 Standing shoulder isometrics - gentle <50% - flexion, abd, ext  5 x 10 second holds   01/10/24 Manual: PROM flexion, abd, ER to tissue tolerance, no pain, and within protocol ranges with holds at end range, frequent cueing for relaxation  Supine AAROM flexion with cane 2 x 10 Supine AAROM ER with cane 2 x 10 Bent over scap retraction 20x  Seated scap retractions 2x 20  1/20  Manual: PROM flexion, abd, ER to tissue tolerance, no pain, and within protocol ranges with holds at end range, frequent cueing for relaxation  Grade I-II GHJ inf, Grade I-II GHJ post mob STM to L pec, L UT Pec doorway (arm low) 30s 3x  Bent over scap retraction 20x (heavy VC and TC needed to reduce shrug)    01/06/24 Manual: PROM flexion, abd, ER to tissue tolerance, no pain, and within protocol ranges with holds at end range, frequent cueing for relaxation Grade I-II GH inferior glide, Grade I-II GH AP glide STM to L pec Supine scap retractions 2x 20  01/03/24 PROM flexion, abd, ER to tissue tolerance, no pain, and within protocol ranges with holds at end range, frequent cueing for relaxation   12/26/23 Therapeutic exercise: LUE Pendulums x10 each cw and ccw,  front to back motion (with LE in wide stance and staggered stance)  Scap squeeze x 3sec x 10 L levator stretch x 15s;  R lateral neck flexion stretch x 15s L  elbow flex/ext x 10 in standing L wrist pronation/ supination x 10 (seated)   Manual: PROM L shoulder flexion, abdct (in scapular plane) and  ER to tissue tolerance, no pain, and within protocol ranges.   STM to Lt periscapular muscles/ thoracic paraspinals/ pec major with pt in supine, arm supported by pillow  PATIENT EDUCATION:  Education details:   rehab protocol  Person educated: Patient Education method: Explanation,  verbal cues Education comprehension: verbalized understanding, returned demonstration, verbal cues required  HOME EXERCISE PROGRAM: Access Code: [email or web address] URL: [email or web address]    ASSESSMENT:  CLINICAL IMPRESSION: Patient demonstrating improving ROM today and further improves with manual. Shoulder discomfort improves with STM to subscap. Flexon AAROM to 145. Initiated isometrics for shoulder which is tolerated well. Patient will continue to benefit from physical therapy in order to improve function and reduce impairment.     OBJECTIVE IMPAIRMENTS: decreased activity tolerance, decreased balance, decreased endurance, decreased mobility, decreased ROM, decreased strength, hypomobility, increased muscle spasms, impaired flexibility, improper body mechanics, postural dysfunction, and pain.   ACTIVITY LIMITATIONS: carrying, lifting, bending, standing, bathing, toileting, dressing, reach over head, hygiene/grooming, locomotion level, and caring for others  PARTICIPATION LIMITATIONS: meal prep, cleaning, laundry, driving, shopping, community activity, occupation, and yard work  PERSONAL FACTORS: Time since onset of injury/illness/exacerbation and 1 comorbidity: hx seizures  are also affecting patient's functional outcome.   REHAB POTENTIAL: Good  CLINICAL DECISION MAKING: Stable/uncomplicated  EVALUATION COMPLEXITY: Low   GOALS: Goals reviewed with patient? Yes  SHORT TERM GOALS: Target date: 01/15/2024    Patient will be independent with HEP in order to improve functional outcomes. Baseline: Goal status: INITIAL  2. Pt will be able to demonstrate at least ability to perform PROM  to protocol limits without pain in order to demonstrate functional improvement in UE function with RC repair protocol.  Baseline:  Goal status: INITIAL  3.  Pt will be able to demonstrate at least 160 flexion and ABD AAROM in order to demonstrate functional improvement in UE ROM for progression to next phase of rehab. Baseline:  Goal status: INITIAL   LONG TERM GOALS: Target date: 02/26/2024     Pt  will become independent with final HEP in order to demonstrate synthesis of PT education. Baseline:  Goal status: INITIAL  2.  Patient will improve FOTO score to predicted outcomes in order to indicate improved tolerance to activity. Baseline: Goal status: INITIAL  3.  Pt will be able to reach [facility] and carry/hold >3 lbs in order to demonstrate functional improvement in L UE strength for return to light OH activity.  Baseline:  Goal status: INITIAL  4.  Pt will be able to demonstrate full AROM of the L shoulder  in order to demonstrate functional improvement in UE for self-care and house hold duties.  Baseline:  Goal status: INITIAL  5.  Patient will demonstrate grade of 5/5 MMT grade in all tested musculature as evidence of improved strength to assist with lifting at home. Baseline:  Goal status: INITIAL    PLAN:  PT FREQUENCY: 1-2x/week  PT DURATION: 12 weeks  PLANNED INTERVENTIONS: 97164- PT Re-evaluation, 97110-Therapeutic exercises, 97530- Therapeutic activity, 97112- Neuromuscular re-education, 97535- Self Care, 65784- Manual therapy, [phone]- Gait training, [phone]- Orthotic Fit/training, [phone]- Canalith repositioning, [id number]- Aquatic Therapy, [phone]- Splinting, Patient/Family education, Balance training, Stair training, Taping, Dry Needling, Joint mobilization, Joint manipulation, Spinal manipulation, Spinal mobilization, Scar mobilization, and DME instructions.   PLAN FOR NEXT SESSION: progress with Dr Serena Croissant RCR protocol  Wyman Songster, PT 01/17/2024, 3:10  PM  [facility] [address] [place], [place], [address] Phone: [phone]   Fax:  [phone]

## 2024-01-24 ENCOUNTER — Encounter (HOSPITAL_BASED_OUTPATIENT_CLINIC_OR_DEPARTMENT_OTHER): Payer: Managed Care, Other (non HMO) | Admitting: Physical Therapy

## 2024-01-25 ENCOUNTER — Ambulatory Visit (HOSPITAL_BASED_OUTPATIENT_CLINIC_OR_DEPARTMENT_OTHER): Payer: Managed Care, Other (non HMO) | Attending: Orthopaedic Surgery | Admitting: Physical Therapy

## 2024-01-25 ENCOUNTER — Encounter (HOSPITAL_BASED_OUTPATIENT_CLINIC_OR_DEPARTMENT_OTHER): Payer: Self-pay | Admitting: Physical Therapy

## 2024-01-25 DIAGNOSIS — M25612 Stiffness of left shoulder, not elsewhere classified: Secondary | ICD-10-CM | POA: Insufficient documentation

## 2024-01-25 DIAGNOSIS — M6281 Muscle weakness (generalized): Secondary | ICD-10-CM | POA: Diagnosis present

## 2024-01-25 DIAGNOSIS — M25512 Pain in left shoulder: Secondary | ICD-10-CM | POA: Diagnosis present

## 2024-01-25 DIAGNOSIS — S42252A Displaced fracture of greater tuberosity of left humerus, initial encounter for closed fracture: Secondary | ICD-10-CM | POA: Insufficient documentation

## 2024-01-25 NOTE — Therapy (Signed)
 OUTPATIENT PHYSICAL THERAPY TREATMENT   Patient Name: Howard Pena MRN: [id number] DOB:Sep 06, 1996, 28 y.o., male Today's Date: 01/26/2024  END OF SESSION:  PT End of Session - 01/25/24 1153     Visit Number 15    Number of Visits 24    Date for PT Re-Evaluation 03/03/24    Authorization Type [employer]    PT Start Time 0800    PT Stop Time 0840    PT Time Calculation (min) 40 min    Activity Tolerance Patient tolerated treatment well;No increased pain    Behavior During Therapy WFL for tasks assessed/performed                 Past Medical History:  Diagnosis Date   Constitutional growth delay    Goiter    Physical growth delay    Poor appetite    Puberty delay    Seizures (HCC)    Past Surgical History:  Procedure Laterality Date   HAND SURGERY Right    has screw in the right wrist/thumb area   ORIF HUMERUS FRACTURE Left 10/09/2023   Procedure: LEFT OPEN REDUCTION INTERNAL FIXATION (ORIF) PROXIMAL HUMERUS FRACTURE;  Surgeon: Genelle Standing, MD;  Location: [facility] OR;  Service: Orthopedics;  Laterality: Left;   right ear surgery     Patient Active Problem List   Diagnosis Date Noted   Closed displaced fracture of greater tuberosity of left humerus 10/09/2023   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   Frequent bowel movements 11/17/2022   Sleep deprivation seizure (HCC) 04/14/2020   Convulsion (HCC) 03/30/2020   Shifting sleep-work schedule, affecting sleep 02/07/2020   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   Peritonsillar abscess 01/28/2020   Physical growth delay    Constitutional growth delay    Goiter    Poor appetite    Puberty delay    Lack of expected normal physiological development in childhood 04/21/2011   Goiter 04/21/2011   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 Some stiffness when he wakes up.    Hand dominance: Right  PERTINENT HISTORY: L shoulder arthroscopic rotator cuff repair, subacromial decompression, and limited debridement on 11/30/23 s/p Left open reduction internal fixation greater tuberosity fracture on 10/09/2023  PAIN:  Are you having pain? No: NPRS scale: 0/10  Pain location: L shoulder Pain description:  Aggravating factors: move quickly, sitting in one position for long period of time Relieving factors: meds   PRECAUTIONS: Shoulder and Other: Follow RCR protocol  RED FLAGS: None   WEIGHT BEARING RESTRICTIONS: Yes NWB LUE  FALLS:  Has patient fallen in last 6 months? Yes. Number of falls 5-10  OCCUPATION: [employer]   PLOF: Independent  PATIENT GOALS: return to normal activity, play with son, return to work   OBJECTIVE: (objective measures from initial evaluation unless otherwise dated)  UPPER EXTREMITY ROM:    Passive ROM Left eval Left  12/21/23 Left 01/03/24 Left 01/06/24 Left 01/10/24  Shoulder flexion 75 ~105 126 130 142  Shoulder extension        Shoulder abduction 50 60 80 80   Shoulder adduction        Shoulder internal rotation        Shoulder external rotation 0 5 15 25    Elbow flexion        Elbow extension        Wrist flexion        Wrist extension        Wrist ulnar deviation        Wrist radial  deviation        Wrist pronation        Wrist supination        (Blank rows = not tested) *=pain/symptoms  TODAY's TREATMENT:  2/6 Manual: All PROM performed with distraction to reduce pain and improve movement PROM into all planes Grade II and III posterior and inferior glides; Pec release; PROM with emphasis on ER  There-ex: Wand flexion x12 1 lb RPE of 3 moved to 2 lb RPE of 7 x12  Reviewed HEP   There-Act  Self Care  Nuro-Re-ed  Supine ABC to Z for multi muscle engagement  Prone: Row 3x12 RPE of 5 ( not loaded )  Prone  extension 1x12        01/17/24 Manual: PROM flexion, abd, ER to tissue tolerance, no pain, and within protocol ranges with holds at end range, frequent cueing for relaxation; STM subscap  Supine AAROM flexion with cane 2 x 10 Supine AAROM ER with cane 2 x 10 Standing shoulder isometrics - gentle <50% - flexion, abd, ext  5 x 10 second holds   01/10/24 Manual: PROM flexion, abd, ER to tissue tolerance, no pain, and within protocol ranges with holds at end range, frequent cueing for relaxation  Supine AAROM flexion with cane 2 x 10 Supine AAROM ER with cane 2 x 10 Bent over scap retraction 20x  Seated scap retractions 2x 20  1/20  Manual: PROM flexion, abd, ER to tissue tolerance, no pain, and within protocol ranges with holds at end range, frequent cueing for relaxation  Grade I-II GHJ inf, Grade I-II GHJ post mob STM to L pec, L UT Pec doorway (arm low) 30s 3x  Bent over scap retraction 20x (heavy VC and TC needed to reduce shrug)    01/06/24 Manual: PROM flexion, abd, ER to tissue tolerance, no pain, and within protocol ranges with holds at end range,  frequent cueing for relaxation Grade I-II GH inferior glide, Grade I-II GH AP glide STM to L pec Supine scap retractions 2x 20  01/03/24 PROM flexion, abd, ER to tissue tolerance, no pain, and within protocol ranges with holds at end range, frequent cueing for  relaxation   12/26/23 Therapeutic exercise: LUE Pendulums x10 each cw and ccw,  front to back motion (with LE in wide stance and staggered stance)  Scap squeeze x 3sec x 10 L levator stretch x 15s;  R lateral neck flexion stretch x 15s L elbow flex/ext x 10 in standing L wrist pronation/ supination x 10 (seated)   Manual: PROM L shoulder flexion, abdct (in scapular plane) and ER to tissue tolerance, no pain, and within protocol ranges.   STM to Lt periscapular muscles/ thoracic paraspinals/ pec major with pt in supine, arm supported by pillow  PATIENT EDUCATION:  Education details:   rehab protocol  Person educated: Patient Education method: Explanation,  verbal cues Education comprehension: verbalized understanding, returned demonstration, verbal cues required  HOME EXERCISE PROGRAM: Access Code: [email or web address] URL: [email or web address]    ASSESSMENT:  CLINICAL IMPRESSION: The patient is currently at 8 weeks post op. We can begin strengthening today. He was given a supine ABC and 2 prone scapular exercises. We also loaded his assisted flexion slightly. Overall he was tired but not significantly painful. He had improved motion with manual therapy. He is still somewhat limited in ER. He was advised to keep stretching at home. We reviewed and revised  his HEP.     OBJECTIVE IMPAIRMENTS: decreased activity tolerance, decreased balance, decreased endurance, decreased mobility, decreased ROM, decreased strength, hypomobility, increased muscle spasms, impaired flexibility, improper body mechanics, postural dysfunction, and pain.   ACTIVITY LIMITATIONS: carrying, lifting, bending, standing, bathing, toileting, dressing, reach over head, hygiene/grooming, locomotion level, and caring for others  PARTICIPATION LIMITATIONS: meal prep, cleaning, laundry, driving, shopping, community activity, occupation, and yard work  PERSONAL FACTORS: Time since onset of  injury/illness/exacerbation and 1 comorbidity: hx seizures  are also affecting patient's functional outcome.   REHAB POTENTIAL: Good  CLINICAL DECISION MAKING: Stable/uncomplicated  EVALUATION COMPLEXITY: Low   GOALS: Goals reviewed with patient? Yes  SHORT TERM GOALS: Target date: 01/15/2024    Patient will be independent with HEP in order to improve functional outcomes. Baseline: Goal status: INITIAL  2. Pt will be able to demonstrate at least ability to perform PROM to protocol limits without pain in order to demonstrate functional improvement in UE function with RC repair protocol.  Baseline:  Goal status: INITIAL  3.  Pt will be able to demonstrate at least 160 flexion and ABD AAROM in order to demonstrate functional improvement in UE ROM for progression to next phase of rehab. Baseline:  Goal status: INITIAL   LONG TERM GOALS: Target date: 02/26/2024     Pt  will become independent with final HEP in order to demonstrate synthesis of PT education. Baseline:  Goal status: INITIAL  2.  Patient will improve FOTO score to predicted outcomes in order to indicate improved tolerance to activity. Baseline: Goal status: INITIAL  3.  Pt will be able to reach [facility] and carry/hold >3 lbs in order to demonstrate functional improvement in L UE strength for return to light OH activity.  Baseline:  Goal status: INITIAL  4.  Pt will be able to demonstrate full AROM of the L shoulder  in order to demonstrate functional improvement in UE for self-care and house hold duties.  Baseline:  Goal  status: INITIAL  5.  Patient will demonstrate grade of 5/5 MMT grade in all tested musculature as evidence of improved strength to assist with lifting at home. Baseline:  Goal status: INITIAL    PLAN:  PT FREQUENCY: 1-2x/week  PT DURATION: 12 weeks  PLANNED INTERVENTIONS: 97164- PT Re-evaluation, 97110-Therapeutic exercises, 97530- Therapeutic activity, 97112- Neuromuscular re-education,  97535- Self Care, 02859- Manual therapy, [phone]- Gait training, [phone]- Orthotic Fit/training, [phone]- Canalith repositioning, [id number]- Aquatic Therapy, [phone]- Splinting, Patient/Family education, Balance training, Stair training, Taping, Dry Needling, Joint mobilization, Joint manipulation, Spinal manipulation, Spinal mobilization, Scar mobilization, and DME instructions.   PLAN FOR NEXT SESSION: progress with Dr Danetta RCR protocol    Alm JINNY Don, PT 01/26/2024, 8:15 AM  [facility] [address] [place], [place], [address] Phone: [phone]   Fax:  [phone]

## 2024-01-26 ENCOUNTER — Encounter (HOSPITAL_BASED_OUTPATIENT_CLINIC_OR_DEPARTMENT_OTHER): Payer: Self-pay | Admitting: Physical Therapy

## 2024-01-27 ENCOUNTER — Ambulatory Visit (HOSPITAL_BASED_OUTPATIENT_CLINIC_OR_DEPARTMENT_OTHER): Payer: Managed Care, Other (non HMO) | Admitting: Physical Therapy

## 2024-01-27 ENCOUNTER — Encounter (HOSPITAL_BASED_OUTPATIENT_CLINIC_OR_DEPARTMENT_OTHER): Payer: Self-pay | Admitting: Physical Therapy

## 2024-01-27 DIAGNOSIS — M25612 Stiffness of left shoulder, not elsewhere classified: Secondary | ICD-10-CM

## 2024-01-27 DIAGNOSIS — M6281 Muscle weakness (generalized): Secondary | ICD-10-CM

## 2024-01-27 DIAGNOSIS — M25512 Pain in left shoulder: Secondary | ICD-10-CM

## 2024-01-27 DIAGNOSIS — S42252A Displaced fracture of greater tuberosity of left humerus, initial encounter for closed fracture: Secondary | ICD-10-CM

## 2024-01-27 NOTE — Therapy (Signed)
 OUTPATIENT PHYSICAL THERAPY TREATMENT   Patient Name: Howard Pena MRN: [id number] DOB:07-09-1996, 28 y.o., male Today's Date: 01/27/2024  END OF SESSION:  PT End of Session - 01/27/24 0830     Visit Number 16    Number of Visits 24    Date for PT Re-Evaluation 03/03/24    Authorization Type [employer]    PT Start Time 0830    PT Stop Time 0910    PT Time Calculation (min) 40 min    Activity Tolerance Patient tolerated treatment well;No increased pain    Behavior During Therapy WFL for tasks assessed/performed                 Past Medical History:  Diagnosis Date   Constitutional growth delay    Goiter    Physical growth delay    Poor appetite    Puberty delay    Seizures (HCC)    Past Surgical History:  Procedure Laterality Date   HAND SURGERY Right    has screw in the right wrist/thumb area   ORIF HUMERUS FRACTURE Left 10/09/2023   Procedure: LEFT OPEN REDUCTION INTERNAL FIXATION (ORIF) PROXIMAL HUMERUS FRACTURE;  Surgeon: Genelle Standing, MD;  Location: [facility] OR;  Service: Orthopedics;  Laterality: Left;   right ear surgery     Patient Active Problem List   Diagnosis Date Noted   Closed displaced fracture of greater tuberosity of left humerus 10/09/2023   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   Frequent bowel movements 11/17/2022   Sleep deprivation seizure (HCC) 04/14/2020   Convulsion (HCC) 03/30/2020   Shifting sleep-work schedule, affecting sleep 02/07/2020   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   Peritonsillar abscess 01/28/2020   Physical growth delay    Constitutional growth delay    Goiter    Poor appetite    Puberty delay    Lack of expected normal physiological development in childhood 04/21/2011   Goiter 04/21/2011   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, not elsewhere classified 04/21/2011    PCP: Lynwood Crandall NP  REFERRING PROVIDER: Genelle Standing, MD  REFERRING DIAG:  S42.252A (ICD-10-CM) - Displaced fracture of greater  tuberosity of left humerus, initial encounter for closed fracture    post op RCR  THERAPY DIAG:  Left shoulder pain, unspecified chronicity  Stiffness of left shoulder, not elsewhere classified  Muscle weakness (generalized)  Displaced fracture of greater tuberosity of left humerus, initial encounter for closed fracture  Rationale for Evaluation and Treatment: Rehabilitation  ONSET DATE: Post op 11/30/23 RCR repair    SUBJECTIVE:  SUBJECTIVE STATEMENT: Pt reports had to work all night. Shoulder has been doing alright.    Hand dominance: Right  PERTINENT HISTORY: L shoulder arthroscopic rotator cuff repair, subacromial decompression, and limited debridement on 11/30/23 s/p Left open reduction internal fixation greater tuberosity fracture on 10/09/2023  PAIN:  Are you having pain? No: NPRS scale: 0/10  Pain location: L shoulder Pain description:  Aggravating factors: move quickly, sitting in one position for long period of time Relieving factors: meds   PRECAUTIONS: Shoulder and Other: Follow RCR protocol  RED FLAGS: None   WEIGHT BEARING RESTRICTIONS: Yes NWB LUE  FALLS:  Has patient fallen in last 6 months? Yes. Number of falls 5-10  OCCUPATION: [employer]   PLOF: Independent  PATIENT GOALS: return to normal activity, play with son, return to work   OBJECTIVE: (objective measures from initial evaluation unless otherwise dated)  UPPER EXTREMITY ROM:    Passive ROM Left eval Left  12/21/23 Left 01/03/24 Left 01/06/24 Left 01/10/24 Left 01/27/24  Shoulder flexion 75 ~105 126 130 142 159  Shoulder extension         Shoulder abduction 50 60 80 80    Shoulder adduction         Shoulder internal rotation         Shoulder external rotation 0 5 15 25     Elbow flexion          Elbow extension         Wrist flexion         Wrist extension         Wrist ulnar deviation         Wrist radial deviation         Wrist pronation         Wrist supination         (Blank rows = not tested) *=pain/symptoms  TODAY's TREATMENT:  01/27/24 Supine AAROM wand flexion 2# 2 x 10  Recumbent AAROM flexion with cane 2 x 10 RPE 6-7/10 Supine ABC x 2  Manual: STM to L pec, PROM into all planes Grade II and III posterior and inferior glides Prone row 3 x 12 Prone shoulder extension 3 x 10 Prone horizontal abduction 2 x 10 Pulley flexion 2 minutes   2/6 Manual: All PROM performed with distraction to reduce pain and improve movement PROM into all planes Grade II and III posterior and inferior glides; Pec release; PROM with emphasis on ER  There-ex: Wand flexion x12 1 lb RPE of 3 moved to 2 lb RPE of 7 x12  Reviewed HEP  There-Act  Self Care  Nuro-Re-ed  Supine ABC to Z for multi muscle engagement  Prone: Row 3x12 RPE of 5 ( not loaded )  Prone  extension 1x12    01/17/24 Manual: PROM flexion, abd, ER to tissue tolerance, no pain, and within protocol ranges with holds at end range, frequent cueing for relaxation; STM subscap  Supine AAROM flexion with cane 2 x 10 Supine AAROM ER with cane 2 x 10 Standing shoulder isometrics - gentle <50% - flexion, abd, ext  5 x 10 second holds   01/10/24 Manual: PROM flexion, abd, ER to tissue tolerance, no pain, and within protocol ranges with holds at end range, frequent cueing for relaxation  Supine AAROM flexion with cane 2 x 10 Supine AAROM ER with cane 2 x 10 Bent over scap retraction 20x  Seated scap retractions 2x 20    PATIENT EDUCATION:  Education details:  rehab protocol 01/27/24: HEP Person educated: Patient Education method: Explanation,  verbal cues Education comprehension: verbalized understanding, returned demonstration, verbal cues required  HOME EXERCISE PROGRAM: Access Code: [email or web address] URL:  [email or web address]    ASSESSMENT:  CLINICAL IMPRESSION: The patient is currently at 8 weeks post op. Demonstrating improving ROM to 159 flexion AAROM. Manual to hyperactive and tender pec with decrease in tissue tension. Continued with periscap strengthening without resistance which is tolerated well. Moderate fatigue at end of session. Patient will continue to benefit from physical therapy in order to improve function and reduce impairment.     OBJECTIVE IMPAIRMENTS: decreased activity tolerance, decreased balance, decreased endurance, decreased mobility, decreased ROM, decreased strength, hypomobility, increased muscle spasms, impaired flexibility, improper body mechanics, postural dysfunction, and pain.   ACTIVITY LIMITATIONS: carrying, lifting, bending, standing, bathing, toileting, dressing, reach over head, hygiene/grooming, locomotion level, and caring for others  PARTICIPATION LIMITATIONS: meal prep, cleaning, laundry, driving, shopping, community activity, occupation, and yard work  PERSONAL FACTORS: Time since onset of injury/illness/exacerbation and 1 comorbidity: hx seizures  are also affecting patient's functional outcome.   REHAB POTENTIAL: Good  CLINICAL DECISION MAKING: Stable/uncomplicated  EVALUATION COMPLEXITY: Low   GOALS: Goals reviewed with patient? Yes  SHORT TERM GOALS: Target date: 01/15/2024    Patient will be independent with HEP in order to improve functional outcomes. Baseline: Goal status: INITIAL  2. Pt will be able to demonstrate at least ability to perform PROM to protocol limits without pain in order to demonstrate functional improvement in UE function with RC repair protocol.  Baseline:  Goal status: INITIAL  3.  Pt will be able to demonstrate at least 160 flexion and ABD AAROM in order to demonstrate functional improvement in UE ROM for progression to next phase of rehab. Baseline:  Goal status: INITIAL   LONG TERM  GOALS: Target date: 02/26/2024     Pt  will become independent with final HEP in order to demonstrate synthesis of PT education. Baseline:  Goal status: INITIAL  2.  Patient will improve FOTO score to predicted outcomes in order to indicate improved tolerance to activity. Baseline: Goal status: INITIAL  3.  Pt will be able to reach [facility] and carry/hold >3 lbs in order to demonstrate functional improvement in L UE strength for return to light [place] activity.  Baseline:  Goal status: INITIAL  4.  Pt will be able to demonstrate full AROM of the L shoulder  in order to demonstrate functional improvement in UE for self-care and house hold duties.  Baseline:  Goal status: INITIAL  5.  Patient will demonstrate grade of 5/5 MMT grade in all tested musculature as evidence of improved strength to assist with lifting at home. Baseline:  Goal status: INITIAL    PLAN:  PT FREQUENCY: 1-2x/week  PT DURATION: 12 weeks  PLANNED INTERVENTIONS: 97164- PT Re-evaluation, 97110-Therapeutic exercises, 97530- Therapeutic activity, 97112- Neuromuscular re-education, 97535- Self Care, [id number]- Manual therapy, [phone]- Gait training, [phone]- Orthotic Fit/training, [phone]- Canalith repositioning, [id number]- Aquatic Therapy, [phone]- Splinting, Patient/Family education, Balance training, Stair training, Taping, Dry Needling, Joint mobilization, Joint manipulation, Spinal manipulation, Spinal mobilization, Scar mobilization, and DME instructions.   PLAN FOR NEXT SESSION: progress with Dr Danetta RCR protocol    Prentice GORMAN Stains, PT 01/27/2024, 8:30 AM  [facility] [address] [place], [place], [address] Phone: [phone]   Fax:  [phone]

## 2024-01-31 ENCOUNTER — Ambulatory Visit (HOSPITAL_BASED_OUTPATIENT_CLINIC_OR_DEPARTMENT_OTHER): Payer: Managed Care, Other (non HMO) | Admitting: Orthopaedic Surgery

## 2024-01-31 DIAGNOSIS — S46012A Strain of muscle(s) and tendon(s) of the rotator cuff of left shoulder, initial encounter: Secondary | ICD-10-CM

## 2024-01-31 NOTE — Progress Notes (Signed)
Post Operative Evaluation      Procedure/Date of Surgery: Left shoulder arthroscopic rotator cuff repair with limited debridement and decompression 11/30/2023   Interval History:    Patient presents today 8 weeks out.  Overall he is doing extremely well.  Range of motion is essentially normalized.  Denies any pain with overhead motion   PMH/PSH/Family History/Social History/Meds/Allergies:         Past Medical History:  Diagnosis Date   Constitutional growth delay     Goiter     Physical growth delay     Poor appetite     Puberty delay     Seizures (HCC)               Past Surgical History:  Procedure Laterality Date   HAND SURGERY Right      has screw in the right wrist/thumb area   ORIF HUMERUS FRACTURE Left 10/09/2023    Procedure: LEFT OPEN REDUCTION INTERNAL FIXATION (ORIF) PROXIMAL HUMERUS FRACTURE;  Surgeon: Huel Cote, MD;  Location: [facility] OR;  Service: Orthopedics;  Laterality: Left;   right ear surgery            Social History         Socioeconomic History   Marital status: Single      Spouse name: Not on file   Number of children: 1   Years of education: Not on file   Highest education level: Not on file  Occupational History   Not on file  Tobacco Use   Smoking status: Former      Types: Cigars, E-cigarettes   Smokeless tobacco: Never   Tobacco comments:      quit 2 months ago  Vaping Use   Vaping status: Every Day   Substances: Nicotine, Flavoring  Substance and Sexual Activity   Alcohol use: Yes      Comment: on the weekends sometimes. depends on the occassion beers   Drug use: No   Sexual activity: Yes      Birth control/protection: None  Other Topics Concern   Not on file  Social History Narrative    Howard Pena (3)    Is engaged to [employer]    Fulltime: order selector at [employer]              Hobbies: none    Social Drivers of Manufacturing engineer Strain: Not on file  Food Insecurity: Not on file   Transportation Needs: Not on file  Physical Activity: Not on file  Stress: Not on file  Social Connections: Not on file         Family History  Problem Relation Age of Onset   Thyroid disease Mother     Post-traumatic stress disorder Father     Stomach cancer Other     Cancer Maternal Great-grandmother          not sure of the type        Allergies       Allergies  Allergen Reactions   Bee Venom Swelling   Amoxicillin Other (See Comments)      Did it involve swelling of the face/tongue/throat, SOB, or low BP? Unknown Did it involve sudden or severe rash/hives, skin peeling, or any reaction on the inside of your mouth or nose? Unknown Did you need to seek medical attention at a hospital or doctor's office? Unknown When did it last happen?  pt was a child     If  all above answers are "NO", may proceed with cephalosporin use.              Current Outpatient Medications  Medication Sig Dispense Refill   aspirin EC 325 MG tablet Take 1 tablet (325 mg total) by mouth daily. 14 tablet 0   oxyCODONE (ROXICODONE) 5 MG immediate release tablet Take 1 tablet (5 mg total) by mouth every 4 (four) hours as needed for severe pain (pain score 7-10) or breakthrough pain. 30 tablet 0   levETIRAcetam (KEPPRA) 750 MG tablet Take 1 tablet (750 mg total) by mouth daily. 90 tablet 1      No current facility-administered medications for this visit.             Facility-Administered Medications Ordered in Other Visits  Medication Dose Route Frequency Provider Last Rate Last Admin   gadobenate dimeglumine (MULTIHANCE) injection 12 mL  12 mL Intravenous Once PRN Dohmeier, Porfirio Mylar, MD          Imaging Results (Last 48 hours)  No results found.     Review of Systems:   A ROS was performed including pertinent positives and negatives as documented in the HPI.     Musculoskeletal Exam:     There were no vitals taken for this visit.   Left shoulder incisions are well-appearing with sutures  in place without evidence of erythema or drainage.  Patient's left arm immobilized in a sling.  Distal neurosensory exam is intact.   Imaging:     Assessment:   Patient is 8 weeks status post left shoulder rotator cuff repair overall doing well.  At this time we will return him back to his work as he is able to avoid lifting with this.  I will plan to see him back in 8 weeks for final check Plan :     -Return to clinic in 8 weeks for reassessment         I personally saw and evaluated the patient, and participated in the management and treatment plan.

## 2024-02-07 ENCOUNTER — Encounter: Payer: Managed Care, Other (non HMO) | Admitting: Nurse Practitioner

## 2024-02-24 ENCOUNTER — Encounter (HOSPITAL_BASED_OUTPATIENT_CLINIC_OR_DEPARTMENT_OTHER): Payer: Self-pay | Admitting: Emergency Medicine

## 2024-02-24 ENCOUNTER — Other Ambulatory Visit: Payer: Self-pay

## 2024-02-24 ENCOUNTER — Emergency Department (HOSPITAL_BASED_OUTPATIENT_CLINIC_OR_DEPARTMENT_OTHER)
Admission: EM | Admit: 2024-02-24 | Discharge: 2024-02-24 | Disposition: A | Discharge status: Home / Self Care | Attending: Emergency Medicine | Admitting: Emergency Medicine

## 2024-02-24 DIAGNOSIS — M25512 Pain in left shoulder: Secondary | ICD-10-CM | POA: Insufficient documentation

## 2024-02-24 DIAGNOSIS — Z7982 Long term (current) use of aspirin: Secondary | ICD-10-CM | POA: Insufficient documentation

## 2024-02-24 NOTE — Discharge Instructions (Signed)
 You have been evaluated for your shoulder pain.  There are no evidence of frozen shoulder, shoulder dislocation, fracture, or signs of infection.  Your shoulder pain could be due to deconditioning of your muscles from lack of use from a prior injury or it could be muscle injury.  You may wear sling for support and follow-up closely with your orthopedist for further care.  Take over-the-counter Tylenol or ibuprofen as needed for pain.  Return if you have any concern.

## 2024-02-24 NOTE — ED Provider Notes (Signed)
 [place] EMERGENCY DEPARTMENT AT [facility] Provider Note   CSN: [id number] Arrival date & time: 02/24/24  1255     History  Chief Complaint  Patient presents with   Shoulder Pain    Howard Pena is a 28 y.o. male.  The history is provided by the patient and medical records. No language interpreter was used.  Shoulder Pain Associated symptoms: no fever      28 year old male who recently had a left shoulder rotator cuff repair by Dr. Steward Drone presenting with complaints of shoulder discomfort.  Patient reports he was seen by his orthopedist last month for follow-up of his shoulder surgery and he was doing fine.  He was given permission to return to work without any restriction.  He has been performing lifting and doing his usual activities however for the past several days he noticed pain to his left shoulder.  Pain is described as muscle tightness, worse when he tries to raise his arm and resolve when he keep it still and not moving it.  He voiced concerns and unsure if this is normal muscle healing versus him reinjuring it.  He denies any numbness he denies any specific injury directly to that shoulder and denies any arm weakness.  No neck pain.  He does have a sling at home.  Home Medications Prior to Admission medications   Medication Sig Start Date End Date Taking? Authorizing Provider  aspirin EC 325 MG tablet Take 1 tablet (325 mg total) by mouth daily. 11/30/23   Huel Cote, MD  oxyCODONE (ROXICODONE) 5 MG immediate release tablet Take 1 tablet (5 mg total) by mouth every 4 (four) hours as needed for severe pain (pain score 7-10) or breakthrough pain. 11/30/23   Huel Cote, MD  levETIRAcetam (KEPPRA) 750 MG tablet Take 1 tablet (750 mg total) by mouth daily. 01/03/24   Eden Emms, NP      Allergies    Bee venom and Amoxicillin    Review of Systems   Review of Systems  Constitutional:  Negative for fever.  Musculoskeletal:  Positive for  arthralgias.  Skin:  Negative for wound.    Physical Exam Updated Vital Signs BP (!) 141/94 (BP Location: Right Arm)   Pulse 80   Temp 98.2 F (36.8 C)   Resp 20   Ht 5\' 8"  (1.727 m)   Wt 61.2 kg   SpO2 100%   BMI 20.51 kg/m  Physical Exam Vitals and nursing note reviewed.  Constitutional:      General: He is not in acute distress.    Appearance: He is well-developed.  HENT:     Head: Atraumatic.  Eyes:     Conjunctiva/sclera: Conjunctivae normal.  Musculoskeletal:        General: Tenderness (Left shoulder: Mild tenderness to palpation left shoulder with increasing pain with shoulder raise.  Patient maintained full passive range of motion but decreased active range of motion.  No deformity noted.) present.     Cervical back: Neck supple.  Skin:    Findings: No rash.  Neurological:     Mental Status: He is alert.     ED Results / Procedures / Treatments   Labs (all labs ordered are listed, but only abnormal results are displayed) Labs Reviewed - No data to display  EKG None  Radiology No results found.  Procedures Procedures    Medications Ordered in ED Medications - No data to display  ED Course/ Medical Decision Making/ A&P  Medical Decision Making  BP 125/89   Pulse 60   Temp 98.2 F (36.8 C)   Resp 20   Ht 5\' 8"  (1.727 m)   Wt 61.2 kg   SpO2 98%   BMI 20.51 kg/m   61:56 PM 28 year old male who recently had a left shoulder rotator cuff repair by Dr. Steward Drone presenting with complaints of shoulder discomfort.  Patient reports he was seen by his orthopedist last month for follow-up of his shoulder surgery and he was doing fine.  He was given permission to return to work without any restriction.  He has been performing lifting and doing his usual activities however for the past several days he noticed pain to his left shoulder.  Pain is described as muscle tightness, worse when he tries to raise his arm and resolve  when he keep it still and not moving it.  He voiced concerns and unsure if this is normal muscle healing versus him reinjuring it.  He denies any numbness he denies any specific injury directly to that shoulder and denies any arm weakness.  No neck pain.  He does have a sling at home.  Exam notable for pain with active range of motion about the left shoulder but normal range of motion with passive range of motion to the left shoulder.  No signs of infection no deformity noted.  Well appearing surgical scar noted.  No cervical midline spine tenderness.  At this time have low suspicion for septic joint, shoulder dislocation, fracture, or contusion.  Pain is likely muscle skeletal.  Doubt adhesive capsulitis.  Encourage patient to wear his sling and follow-up closely with his orthopedist for outpatient management.  Work note provided.  Patient stable for discharge.        Final Clinical Impression(s) / ED Diagnoses Final diagnoses:  Left shoulder pain, unspecified chronicity    Rx / DC Orders ED Discharge Orders     None         Fayrene Helper, PA-C 02/24/24 1543    Arby Barrette, MD 02/25/24 [phone]

## 2024-02-24 NOTE — ED Triage Notes (Signed)
 Pt via pov from home with left shoulder pain since Thursday. Pt reports two surgeries on it in Oct and Dec; was released in Feb for work after PT; then returned to full duty at work. He does not recall any injury during that day, but states he has limited ROM in the shoulder now. Pt alert & oriented, nad noted.

## 2024-03-03 ENCOUNTER — Other Ambulatory Visit: Payer: Self-pay | Admitting: Nurse Practitioner

## 2024-04-17 ENCOUNTER — Encounter: Payer: Managed Care, Other (non HMO) | Admitting: Nurse Practitioner

## 2024-04-17 ENCOUNTER — Ambulatory Visit (INDEPENDENT_AMBULATORY_CARE_PROVIDER_SITE_OTHER): Admitting: Nurse Practitioner

## 2024-04-17 ENCOUNTER — Encounter: Payer: Self-pay | Admitting: Nurse Practitioner

## 2024-04-17 VITALS — BP 118/78 | HR 63 | Temp 98.2°F | Ht 68.0 in | Wt 120.6 lb

## 2024-04-17 DIAGNOSIS — Z131 Encounter for screening for diabetes mellitus: Secondary | ICD-10-CM

## 2024-04-17 DIAGNOSIS — E049 Nontoxic goiter, unspecified: Secondary | ICD-10-CM | POA: Diagnosis not present

## 2024-04-17 DIAGNOSIS — Z13220 Encounter for screening for lipoid disorders: Secondary | ICD-10-CM

## 2024-04-17 DIAGNOSIS — H6123 Impacted cerumen, bilateral: Secondary | ICD-10-CM | POA: Insufficient documentation

## 2024-04-17 DIAGNOSIS — Z1159 Encounter for screening for other viral diseases: Secondary | ICD-10-CM

## 2024-04-17 DIAGNOSIS — Z0000 Encounter for general adult medical examination without abnormal findings: Secondary | ICD-10-CM

## 2024-04-17 DIAGNOSIS — Z87898 Personal history of other specified conditions: Secondary | ICD-10-CM

## 2024-04-17 DIAGNOSIS — Z681 Body mass index (BMI) 19 or less, adult: Secondary | ICD-10-CM | POA: Diagnosis not present

## 2024-04-17 DIAGNOSIS — R636 Underweight: Secondary | ICD-10-CM | POA: Insufficient documentation

## 2024-04-17 MED ORDER — LEVETIRACETAM 750 MG PO TABS: 750.0000 mg | ORAL_TABLET | Freq: Every day | ORAL | 3 refills | Status: DC

## 2024-04-17 NOTE — Assessment & Plan Note (Signed)
 Discussed age-appropriate immunizations and screening exams.  Did review patient's personal, surgical, social, family histories.  Patient is up-to-date on all age-appropriate vaccinations he would like.  Patient is too young for CRC screening or prostate cancer screening.  Patient was given information at discharge about preventative healthcare maintenance with anticipatory guidance.  Patient declined STI screening today

## 2024-04-17 NOTE — Assessment & Plan Note (Signed)
 Pending TSH. Patient is borderline underweight

## 2024-04-17 NOTE — Progress Notes (Signed)
 Established Patient Office Visit  Subjective   Patient ID: Howard Pena, male    DOB: 16-Jul-1996  Age: 28 y.o. MRN: [id number]  Chief Complaint  Patient presents with   Annual Exam    HPI  for complete physical and follow up of chronic conditions.  Seizures: Patient was followed by neurology in the past but has not seen them since being seizure-free.  He is currently maintained on levetiracetam  750 mg daily.  He has been 3 years since his last seizure  Immunizations: -Tetanus: Completed in 2023 -Influenza: out of network  -Shingles: too young -Pneumonia: too young   Diet: Fair diet. He is eating 2 meals a day. He does work second/thrid shift. He does drink energy drinks (2) and water Exercise: No regular exercise. Employemn   Eye exam:  up to date and glasses  Dental exam: Needs upating    Colonoscopy: Too young, currently average risk Lung Cancer Screening: Does not qualify  PSA: Too young, currently average risk  Sleep:  he gets approx 5 hours of sleep on work. On his off days he gets more and will get 8 hours. Does feel rested with adequate sleep.  STI: delcines      Review of Systems  Constitutional:  Negative for chills and fever.  Respiratory:  Negative for shortness of breath.   Cardiovascular:  Negative for chest pain and leg swelling.  Gastrointestinal:  Negative for abdominal pain, blood in stool, constipation, diarrhea, nausea and vomiting.       BM daily   Genitourinary:  Negative for dysuria and hematuria.  Neurological:  Negative for tingling and headaches.  Psychiatric/Behavioral:  Negative for hallucinations and suicidal ideas.       Objective:     BP 118/78   Pulse 63   Temp 98.2 F (36.8 C) (Oral)   Ht 5\' 8"  (1.727 m)   Wt 120 lb 9.6 oz (54.7 kg)   SpO2 99%   BMI 18.34 kg/m  BP Readings from Last 3 Encounters:  04/17/24 118/78  02/24/24 125/89  11/22/23 130/86   Wt Readings from Last 3 Encounters:  04/17/24 120 lb 9.6 oz  (54.7 kg)  02/24/24 134 lb 14.7 oz (61.2 kg)  11/22/23 135 lb (61.2 kg)   SpO2 Readings from Last 3 Encounters:  04/17/24 99%  02/24/24 98%  11/22/23 98%      Physical Exam Vitals and nursing note reviewed.  Constitutional:      Appearance: Normal appearance. He is diaphoretic.  HENT:     Right Ear: Ear canal and external ear normal. There is impacted cerumen.     Left Ear: Ear canal and external ear normal. There is impacted cerumen.     Mouth/Throat:     Mouth: Mucous membranes are moist.     Pharynx: Oropharynx is clear.  Eyes:     Extraocular Movements: Extraocular movements intact.     Pupils: Pupils are equal, round, and reactive to light.  Cardiovascular:     Rate and Rhythm: Normal rate and regular rhythm.     Pulses: Normal pulses.     Heart sounds: Normal heart sounds.  Pulmonary:     Effort: Pulmonary effort is normal.     Breath sounds: Normal breath sounds.  Abdominal:     General: Bowel sounds are normal. There is no distension.     Palpations: There is no mass.     Tenderness: There is no abdominal tenderness.     Hernia: No hernia is  present.  Genitourinary:    Comments: Deferred  Musculoskeletal:     Right lower leg: No edema.     Left lower leg: No edema.  Lymphadenopathy:     Cervical: No cervical adenopathy.  Skin:    General: Skin is warm.  Neurological:     General: No focal deficit present.     Mental Status: He is alert.     Deep Tendon Reflexes:     Reflex Scores:      Bicep reflexes are 2+ on the right side and 2+ on the left side.      Patellar reflexes are 2+ on the right side and 2+ on the left side.    Comments: Bilateral upper and lower extremity strength 5/5  Psychiatric:        Mood and Affect: Mood normal.        Behavior: Behavior normal.        Thought Content: Thought content normal.        Judgment: Judgment normal.      No results found for any visits on 04/17/24.    The ASCVD Risk score (Arnett DK, et al.,  2019) failed to calculate for the following reasons:   The 2019 ASCVD risk score is only valid for ages 46 to 58    Assessment & Plan:   Problem List Items Addressed This Visit       Endocrine   Goiter   Pending TSH. Patient is borderline underweight       Relevant Orders   TSH     Nervous and Auditory   Bilateral impacted cerumen   Verbal consent obtained. Patient was prepped per office policy. Water and hydrogen peroxide was use and bilateral ears were irrigated. Patient tolerated procedure well. Bilateral impactions were removed and TMs were WNL post procedure       Relevant Orders   Ear Lavage     Other   Preventative health care - Primary   Discussed age-appropriate immunizations and screening exams.  Did review patient's personal, surgical, social, family histories.  Patient is up-to-date on all age-appropriate vaccinations he would like.  Patient is too young for CRC screening or prostate cancer screening.  Patient was given information at discharge about preventative healthcare maintenance with anticipatory guidance.  Patient declined STI screening today      History of seizures   Patient maintained on Keppra  at 150 mg daily.  Refill provided      Relevant Medications   levETIRAcetam  (KEPPRA ) 750 MG tablet   Other Relevant Orders   CBC   Comprehensive metabolic panel with GFR   Underweight (BMI < 18.5)   Patient attributes the weight gain to being less active post surgery but now back active and working. Will check basic labs and have him follow up in 3 months to recheck weight       Relevant Orders   CBC   Comprehensive metabolic panel with GFR   Hemoglobin A1c   TSH   Other Visit Diagnoses       Encounter for hepatitis C screening test for low risk patient       Relevant Orders   Hepatitis C Antibody     Screening for lipid disorders       Relevant Orders   Lipid panel     Screening for diabetes mellitus       Relevant Orders   Hemoglobin A1c        Return in about 3 months (around  07/17/2024) for weight recheck .    Margarie Shay, NP

## 2024-04-17 NOTE — Patient Instructions (Signed)
 Nice to see you today I will be in touch with the labs once I have them Follow up with me in 3 months for a weight recheck.

## 2024-04-17 NOTE — Assessment & Plan Note (Signed)
 Patient maintained on Keppra  at 150 mg daily.  Refill provided

## 2024-04-17 NOTE — Assessment & Plan Note (Signed)
 Patient attributes the weight gain to being less active post surgery but now back active and working. Will check basic labs and have him follow up in 3 months to recheck weight

## 2024-04-17 NOTE — Assessment & Plan Note (Signed)
 Verbal consent obtained. Patient was prepped per office policy. Water and hydrogen peroxide was use and bilateral ears were irrigated. Patient tolerated procedure well. Bilateral impactions were removed and TMs were WNL post procedure

## 2024-04-18 LAB — LIPID PANEL
Cholesterol: 205 mg/dL — ABNORMAL HIGH (ref 0–200)
HDL: 63 mg/dL (ref >39.00)
LDL Cholesterol: 117 mg/dL — ABNORMAL HIGH (ref 0–99)
NonHDL: 142.42
Total CHOL/HDL Ratio: 3
Triglycerides: 128 mg/dL (ref 0.0–149.0)
VLDL: 25.6 mg/dL (ref 0.0–40.0)

## 2024-04-18 LAB — CBC
HCT: 45.7 % (ref 39.0–52.0)
Hemoglobin: 15.2 g/dL (ref 13.0–17.0)
MCHC: 33.1 g/dL (ref 30.0–36.0)
MCV: 83.4 fl (ref 78.0–100.0)
Platelets: 256 10*3/uL (ref 150.0–400.0)
RBC: 5.49 Mil/uL (ref 4.22–5.81)
RDW: 13.7 % (ref 11.5–15.5)
WBC: 6.2 10*3/uL (ref 4.0–10.5)

## 2024-04-18 LAB — HEMOGLOBIN A1C: Hgb A1c MFr Bld: 5.4 % (ref 4.6–6.5)

## 2024-04-18 LAB — COMPREHENSIVE METABOLIC PANEL WITH GFR
ALT: 12 U/L (ref 0–53)
AST: 15 U/L (ref 0–37)
Albumin: 4.7 g/dL (ref 3.5–5.2)
Alkaline Phosphatase: 58 U/L (ref 39–117)
BUN: 11 mg/dL (ref 6–23)
CO2: 28 meq/L (ref 19–32)
Calcium: 9.9 mg/dL (ref 8.4–10.5)
Chloride: 101 meq/L (ref 96–112)
Creatinine, Ser: 0.99 mg/dL (ref 0.40–1.50)
GFR: 103.19 mL/min (ref >60.00)
Glucose, Bld: 83 mg/dL (ref 70–99)
Potassium: 4.4 meq/L (ref 3.5–5.1)
Sodium: 137 meq/L (ref 135–145)
Total Bilirubin: 1.1 mg/dL (ref 0.2–1.2)
Total Protein: 7.2 g/dL (ref 6.0–8.3)

## 2024-04-18 LAB — TSH: TSH: 0.52 u[IU]/mL (ref 0.35–5.50)

## 2024-04-18 LAB — HEPATITIS C ANTIBODY: Hepatitis C Ab: NONREACTIVE

## 2024-04-19 ENCOUNTER — Encounter: Payer: Self-pay | Admitting: Nurse Practitioner

## 2024-05-16 ENCOUNTER — Other Ambulatory Visit: Payer: Self-pay | Admitting: Nurse Practitioner

## 2024-05-16 DIAGNOSIS — Z87898 Personal history of other specified conditions: Secondary | ICD-10-CM

## 2024-06-10 ENCOUNTER — Encounter: Payer: Self-pay | Admitting: Nurse Practitioner

## 2024-06-10 ENCOUNTER — Ambulatory Visit: Admitting: Nurse Practitioner

## 2024-06-10 ENCOUNTER — Telehealth: Payer: Self-pay

## 2024-06-10 VITALS — BP 128/96 | HR 100 | Temp 98.4°F | Ht 68.0 in | Wt 123.2 lb

## 2024-06-10 DIAGNOSIS — R41840 Attention and concentration deficit: Secondary | ICD-10-CM | POA: Diagnosis not present

## 2024-06-10 DIAGNOSIS — R634 Abnormal weight loss: Secondary | ICD-10-CM | POA: Insufficient documentation

## 2024-06-10 MED ORDER — BUPROPION HCL ER (XL) 150 MG PO TB24: 150.0000 mg | ORAL_TABLET | Freq: Every day | ORAL | 1 refills | Status: DC

## 2024-06-10 NOTE — Telephone Encounter (Signed)
 Copied from CRM [id number]. Topic: Referral - Question >> Jun 10, 2024 10:16 AM [place] A wrote: Reason for CRM: Referral said it would be at least 6-9 months before you can be seen. Please find a different provider

## 2024-06-10 NOTE — Telephone Encounter (Unsigned)
 Copied from CRM [id number]. Topic: Referral - Question >> Jun 10, 2024 10:16 AM Howard Pena wrote: Reason for CRM: Referral said it would be at least 6-9 months before you can be seen. Please find Pena different provider

## 2024-06-10 NOTE — Assessment & Plan Note (Signed)
 Patient has gained 3 pounds since last office visit.  Also has stopped.  Stable at this juncture

## 2024-06-10 NOTE — Progress Notes (Signed)
 Established Patient Office Visit  Subjective   Patient ID: Howard Pena, male    DOB: 21-Mar-1996  Age: 28 y.o. MRN: [id number]  Chief Complaint  Patient presents with   Weight Check   Difficulty Concentrating    Pt complains of having trouble being focused. States that his fiance has also noticed his lack of concentration.     HPI  Weight recheck: Patient was seen by me on 04/17/2024 for physical.  At that juncture patient had lost approximately 14 pounds over the past year.  He attributed it to being out for surgery causing weight gain and now being back active with work.  At last office visit patient was eating 2 meals a day.  This was mediated by his 2nd/3rd shift schedule.  He was drinking 2 energy drinks a day along with water. States that he has slowed down on the energy drinks. States that he will do 1-2 through out the week   Concentration concern: states taht he and his fiancane have noticed that he is having concerntration deficits. States that he will be at work and start something and then go to another task without noticein. States that he can be watching tv and talking to his fianace and then he will get into the show and not hear what she is saying. 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 States prior to his surgery.    Review of Systems  Constitutional:  Negative for chills and fever.  Respiratory:  Negative for shortness of breath.   Cardiovascular:  Negative for chest pain.  Neurological:  Negative for dizziness and headaches.      Objective:     BP (!) 128/96   Pulse 100   Temp 98.4 F (36.9 C) (Oral)   Ht 5' 8 (1.727 m)   Wt 123 lb 3.2 oz (55.9 kg)   SpO2 99%   BMI 18.73 kg/m  BP Readings from Last 3 Encounters:   06/10/24 (!) 128/96  04/17/24 118/78  02/24/24 125/89   Wt Readings from Last 3 Encounters:  06/10/24 123 lb 3.2 oz (55.9 kg)  04/17/24 120 lb 9.6 oz (54.7 kg)  02/24/24 134 lb 14.7 oz (61.2 kg)   SpO2 Readings from Last 3 Encounters:  06/10/24 99%  04/17/24 99%  02/24/24 98%      Physical Exam Vitals and nursing note reviewed.  Constitutional:      Appearance: Normal appearance.   Cardiovascular:     Rate and Rhythm: Normal rate and regular rhythm.     Heart sounds: Normal heart sounds.  Pulmonary:     Effort: Pulmonary effort is normal.     Breath sounds: Normal breath sounds.   Neurological:     Mental Status: He is alert.      No results found for any visits on 06/10/24.    The ASCVD Risk score (Arnett DK, et al., 2019) failed to calculate for the following reasons:   The 2019 ASCVD risk score is only valid for ages 80 to 79    Assessment & Plan:   Problem List Items Addressed This Visit       Other   Concentration deficit - Primary   Patient states  been dealing with concentration deficit over the past year.  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  Will send patient over to psychology for evaluation of ADD/ADD.  Ambulatory referral placed today.  We will trial patient on Wellbutrin 150 mg daily to see if this helps with patient's concentration.  Like to be mindful if we moved to the stimulant route about patient and his weight      Relevant Medications   buPROPion (WELLBUTRIN XL) 150 MG 24 hr tablet   Other Relevant Orders   Ambulatory referral to Psychology   Weight loss   Patient has gained 3 pounds since last office visit.  Also has stopped.  Stable at this juncture       Return in about 5 weeks (around 07/15/2024) for Attention recheck/weight .    Adina Crandall, NP

## 2024-06-10 NOTE — Telephone Encounter (Signed)
 I have changed the referral to a different clinic

## 2024-06-10 NOTE — Assessment & Plan Note (Signed)
 Patient states been dealing with concentration deficit over the past year.  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  Will send patient over to psychology for evaluation of ADD/ADD.  Ambulatory referral placed today.  We will trial patient on Wellbutrin 150 mg daily to see if this helps with patient's concentration.  Like to be mindful if we moved to the stimulant route about patient and his weight

## 2024-06-10 NOTE — Telephone Encounter (Addendum)
 Psychology office has placed pt on testing waitlist. Pt called to find a different provider due to 6-9 month wait time.

## 2024-06-10 NOTE — Patient Instructions (Signed)
 Nice to see you today I have sent the prescription to the pharmacy  Follow up with me in 4-6 weeks   I have also referred you to psychology

## 2024-06-10 NOTE — Addendum Note (Signed)
 Addended by: WENDEE LYNWOOD HERO on: 06/10/2024 12:53 PM   Modules accepted: Orders

## 2024-07-13 ENCOUNTER — Encounter (HOSPITAL_COMMUNITY): Payer: Self-pay

## 2024-07-13 ENCOUNTER — Ambulatory Visit (HOSPITAL_COMMUNITY)
Admission: EM | Admit: 2024-07-13 | Discharge: 2024-07-13 | Disposition: A | Discharge status: Home / Self Care | Attending: Family Medicine | Admitting: Family Medicine

## 2024-07-13 DIAGNOSIS — R519 Headache, unspecified: Secondary | ICD-10-CM | POA: Diagnosis not present
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
## 2024-07-18 ENCOUNTER — Ambulatory Visit: Admitting: Psychology

## 2024-07-18 ENCOUNTER — Ambulatory Visit: Admitting: Nurse Practitioner

## 2024-07-18 DIAGNOSIS — F89 Unspecified disorder of psychological development: Secondary | ICD-10-CM

## 2024-07-18 NOTE — Progress Notes (Addendum)
 Date: 07/18/2024 Appointment Start Time: 5pm Duration: 109 minutes Provider: Frederic Fire, PsyD Type of Session: Initial Appointment for Evaluation  Location of Patient: Home Location of Provider: Provider's Home (private office) Type of Contact: Caregility video visit with audio  Session Content:  Prior to proceeding with today's appointment, two pieces of identifying information were obtained from Viola to verify identity. In addition, Carles's physical location at the time of this appointment was obtained. In the event of technical difficulties, Lehman shared a phone number he could be reached at. Selig and this provider participated in today's telepsychological service. Kayhan denied anyone else being present in the room or on the virtual appointment.  The provider's role was explained to [place]. The provider reviewed and discussed issues of confidentiality, privacy, and limits therein (e.g., reporting obligations). In addition to verbal informed consent, written informed consent for psychological services was obtained from [place] prior to the initial appointment. Written consent included information concerning the practice, financial arrangements, and confidentiality and patients' rights. Since the clinic is not a 24/7 crisis center, mental health emergency resources were shared, and the provider explained e-mail, voicemail, and/or other messaging systems should be utilized only for non-emergency reasons. This provider also explained that information obtained during appointments will be placed in their electronic medical record in a confidential manner. Jemarion verbally acknowledged understanding of the aforementioned and agreed to use mental health emergency resources discussed if needed. Moreover, Jemarcus agreed information may be shared with other [facility] or their referring provider(s) as needed for coordination of care. By signing the new patient documents, Sterlin provided written  consent for coordination of care. Shamarr verbally acknowledged understanding he is ultimately responsible for understanding his insurance benefits as it relates to reimbursement of telepsychological and in-person services. This provider also reviewed confidentiality, as it relates to telepsychological services, as well as the rationale for telepsychological services. This provider further explained that video should not be captured, photos should not be taken, nor should testing stimuli be copied or recorded as it would be a copyright violation. Arnez expressed understanding of the aforementioned, and verbally consented to proceed. Custer is aware of the limitations of teleheath visits and verbally consented to proceed.   Daimian completed the neurobehavioral examination, which included obtaining a, family, social, and psychiatric history; integration of prior history and other sources of clinical data to assist with clinical decision making; behavioral observations; assessment of thinking, reasoning, and judgment; and establishment of a provisional diagnosis. The evaluation was completed in 109 minutes. Codes 03883 and [id number] were billed.   Mental Status Examination:  Appearance:  neat Behavior: appropriate to circumstances Mood: neutral Affect: mood congruent Speech: WNL Eye Contact: appropriate Psychomotor Activity: restless Thought Process: denies suicidal, homicidal, and self-harm ideation, plan and intent Content/Perceptual Disturbances: none Orientation: AAOx4 Cognition/Sensorium: intact Insight: fair Judgment: good  Provisional DSM-5 diagnosis(es):  F89 Unspecified Disorder of Psychological Development   Plan: Testing is expected to answer the question, does the individual meet criteria for ADHD when age, other mental health concerns (e.g., anxiety, depression, and sleep issues), and cognitive functioning are taken into consideration? Further testing is warranted because a diagnosis  cannot be given solely based on current interview data (further data is required). Testing results are expected to answer the remaining diagnostic questions in order to provide an accurate diagnosis and assist in treatment planning with an expectation of improved clinical outcome. Bergen is currently scheduled for an appointment on 07/25/2024 at 2pm via Caregility video visit with audio.  CONFIDENTIAL PSYCHOLOGICAL EVALUATION ______________________________________________________________________________ Name: Henretta Louder Date of Birth: [birth year]    Age: 27   SOURCE AND REASON FOR REFERRAL: Mr. Kolton Kienle was referred by Lynwood Crandall, DNP, for an evaluation to ascertain if he meets the criteria for Attention Deficit/Hyperactivity Disorder (ADHD).    BACKGROUND INFORMATION AND PRESENTING PROBLEM: Mr. Dayvon Dax is a 28 year old male who resides in [place] .  Mr. Moga initially reported ADHD symptoms that became noticeable over the past two years, which he indicated may be attributable to the stress and "little rest" that working long hours during the second and third shifts at his employment entails. However, he later indicated ADHD-related symptoms were present in childhood and that the reduced structure and increased responsibilities of adulthood may be causing his ADHD-related symptoms to become more noticeable. He endorsed the following ADHD-related symptoms:  Symptoms Frequency   Often Occasionally Infrequent/No Significant Issues  Inattention Criterion (DSM-5-TR)     Making careless mistakes and missing small details.    He did not endorse significant issues with this symptom.   Being easily distracted by various stimuli and the mind often being elsewhere even when no apparent distractions exist.  He discussed how various stimuli (e.g., irrelevant tasks and thoughts as well as others talking around him), noting he is prone to daydreaming even when there are no  obvious distractions.     Trouble sustaining attention during conversations.  He reported that this commonly occurs, which leads to his missing the details of conversations.     Does not follow through on instructions and fails to finish tasks.  He endorsed commonly experiencing task initiation and maintenance issues.     Difficulty organizing tasks and activities.  He discussed that he regularly completes tasks in a haphazard manner and last minute, is prone to time blindness, and has trouble planning, noting how inattention and forgetfulness contribute.     Avoids, dislikes, or is reluctant to engage in tasks that require sustained mental effort. He stated he often puts off tasks that he does not perceive as urgent.     Loses things necessary for tasks or activities.  He shared that he forgets items "all the time" and is prone to misplacing them, especially if he is "rushing."     Forgetful in daily activities.  He also shared how he is prone to forgetting what he wants to say, needed items for tasks, plans, and to return calls or texts, despite his efforts to remember.     Hyperactivity and Impulsivity Criterion (DSM-5-TR)     Fidgets with hands or feet or squirms in seat.   He stated he "tend[s] to hum a lot" when the environment is perceived as loud, noting it is habitual and largely unconscious. However, he denied having significant issues with fidgeting or squirming.  Leaves seat in situations in which remaining seated is expected.   He denied experiencing significant issues with this symptom.   Feelings of restlessness.  He stated he tends to only feel restless or agitated after days in which he does not get adequate sleep.    Difficulty playing or engaging in leisure activities quietly. He described being prone to making comments during leisure activities,  which his fianc comments on. He added how once someone comments on his talking during a leisure activity, he usually stops making comments.      "On the go" or often acts as if "driven by a motor." He stated he is prone to "rushing" tasks, "overthinking," and  feeling uncomfortable when observed by others.     Talks excessively.  He described how he is prone to providing excessive details while talking, which others comment on.    Interrupts others.    He denied experiencing significant issues with this symptom.  Impatience.    He denied experiencing significant issues with this symptom.  ADHD-Related Symptoms that Assist in Identifying ADHD but are not in the DSM-5-TR Jeanna, 2021, p. 6-12 and [phone])     Emotional dysregulation and overstimulation. He endorsed being prone to irritability if he "[did not] get enough sleep" or someone interrupts him while he is focused on a task, noting that after being distracted from a task it can be hard to determine "where [he] stopped," to shift his attention back to the task, and/or he tends to "lose motivation" for the task.    Making decisions impulsively. He stated how he is prone to making purchases, of primarily food, that he does not need, which contributes to financial strains.     Tending to drive much faster than others. He described how he is prone to driving 89-84fey over the speed limit, stating he has a "heavy foot" which has led to three speeding tickets. He further described how inattention and a desire to "get home faster" contribute to this symptom.     Trouble following through on promises or commitments made to others.    He denied experiencing significant issues with this symptom.  Trouble doing things in proper order or sequence.   He denied experiencing significant issues with this symptom.   Mr. Terhune discussed a history of occasional sleep-onset and maintenance issues that he attributed to his changing employment hours and his son "playing with [him]" while he is sleeping, as well as regularly not feeling rested upon waking. Mr. Oliff reported a belief that his ADHD-related  symptoms are consistent across situations and exacerbated by reduced sleep.   Mr. Jain denied awareness of having ever experienced any developmental milestone delays, grade retention, learning disability diagnosis, or having an individualized education plan. He stated he "made straight As and Bs" before moving to [place]  in the sixth grade, noting how his new school required him to "switch classrooms" for various classes, which he struggled to adjust to. He further stated a belief that his grades decreased to "Bs to Ds" during this time, which he attributed to difficulty adjusting to the routine of the new school and inattention during class. He noted how he was prone to forgetting needed materials for class, had trouble "waking up and getting ready" for school, and was prone to last-minute completion of chores at home. He reported that he graduated from high school. 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 He described relational strains with his fianc that occur secondary to a "lack of communication," which he attributed to fatigue and "not engaging."   Mr. Hawker reported that his medical history is significant for seizures, for which he utilizes Kepra to assist in managing. He denied awareness of ever experiencing seizures or head injury. He reported he currently utilizes Wellbutrin  for his ADHD-related symptoms, but that it "seems to make [him] more tired" at home but is "energizing" when he is "at work and has things to do." He stated familial mental health history is significant for autism spectrum disorder (son) and ADHD (half-brother).  Frederic ONEIDA Fire, PsyD

## 2024-07-25 ENCOUNTER — Ambulatory Visit: Admitting: Psychology

## 2024-07-25 DIAGNOSIS — F89 Unspecified disorder of psychological development: Secondary | ICD-10-CM

## 2024-07-25 NOTE — Progress Notes (Addendum)
 Date: 07/25/2024   Appointment Start Time: 2-2:35pm (psychodiagnostic interview) and 2:35-3:02pm (WAIS-5 administration). This evaluator briefly left then rejoined the appointment at 2:43pm secondary to Howard Pena report of experiencing a technical issue. Duration: 62 total minutes Provider: Frederic Fire, PsyD Type of Session: Testing Appointment for Evaluation  Location of Patient: Home Location of Provider: Provider's Home (private office) Type of Contact: Caregility video visit with audio  Session Content: Today's appointment was a telepsychological visit. Howard Pena is aware it is his responsibility to secure confidentiality on his end of the session. Prior to proceeding with today's appointment, Howard Pena's physical location at the time of this appointment was obtained as well a phone number he could be reached at in the event of technical difficulties. Howard Pena denied anyone else being present in the room or on the virtual appointment. This provider reviewed that video should not be captured, photos should not be taken, nor should testing stimuli be copied or recorded as it would be a copyright violation. Howard Pena expressed understanding of the aforementioned, and verbally consented to proceed. The WAIS-5 was administered, scored, and interpreted by this evaluator. Howard Pena is aware of the limitations of teleheath visits and verbally consented to proceed.  Howard Pena completed the psychiatric diagnostic evaluation (history, including past, family, social, and  psychiatric history as well as behavioral observations). The evaluation was completed in 35 minutes. Code 09208 was billed.   Billing codes for report writing, scoring, and interpreting will be input on the feedback appointment.   Provisional DSM-5 diagnosis(es):  F89 Unspecified Disorder of Psychological Development   Plan: Niam was scheduled for a feedback appointment on 08/02/2024 at 11am via Caregility video visit with audio.       CONFIDENTIAL PSYCHOLOGICAL EVALUATION ______________________________________________________________________________ Name: Howard Pena Date of Birth: 1996-11-27    Age: 28   BACKGROUND INFORMATION AND PRESENTING PROBLEM: [name] is a 28 year old male who resides in [place] .  [name] initially reported ADHD symptoms that became noticeable over the past two years, which he indicated may be attributable to the stress and "little rest" that working long hours during the second and third shifts at his employment entails. However, during a latter portion of the interview, he indicated ADHD-related symptoms were present in childhood and that the reduced structure and increased responsibilities of adulthood may be causing his ADHD-related symptoms to become more noticeable. Upon a follow-up appointment, he expressed a belief the ADHD-related symptoms were not present in childhood. He endorsed the following ADHD-related symptoms:  Symptoms Frequency   Often Occasionally Infrequent/No Significant Issues  Inattention Criterion (DSM-5-TR)     Making careless mistakes and missing small details.    He did not endorse significant issues with this symptom.   Being easily distracted by various stimuli and the mind often being elsewhere even when no apparent distractions exist.  He discussed how various stimuli (e.g., irrelevant tasks and thoughts as well as others talking around him), noting he is prone to daydreaming even when there are no obvious distractions.     Trouble sustaining attention during conversations.  He reported that this commonly occurs, which leads to his missing the details of conversations.     Does not follow through on instructions and fails to finish tasks.  He endorsed commonly experiencing task initiation and maintenance issues.     Difficulty organizing tasks and activities.  He discussed that he regularly completes tasks in a haphazard manner and last  minute, is prone to time blindness, and has trouble planning, noting how  inattention and forgetfulness contribute.     Avoids, dislikes, or is reluctant to engage in tasks that require sustained mental effort. He stated he often puts off tasks that he does not perceive as urgent.     Loses things necessary for tasks or activities.  He shared that he forgets items "all the time" and is prone to misplacing them, especially if he is "rushing."     Forgetful in daily activities.  He also shared how he is prone to forgetting what he wants to say, needed items for tasks, plans, and to return calls or texts, despite his efforts to remember.     Hyperactivity and Impulsivity Criterion (DSM-5-TR)     Fidgets with hands or feet or squirms in seat.   He stated he "tend[s] to hum a lot" when the environment is perceived as loud, noting it is habitual and largely unconscious. However, he denied having significant issues with fidgeting or squirming.  Leaves seat in situations in which remaining seated is expected.   He denied experiencing significant issues with this symptom.   Feelings of restlessness.  He stated he tends to only feel restless or agitated after days in which he does not get adequate sleep.    Difficulty playing or engaging in leisure activities quietly. He described being prone to making comments during leisure activities,  which his fianc comments on. He added how once someone comments on his talking during a leisure activity, he usually stops making comments.     "On the go" or often acts as if "driven by a motor." He stated he is prone to "rushing" tasks, "overthinking," and feeling uncomfortable when observed by others.     Talks excessively.  He described how he is prone to providing excessive details while talking, which others comment on.    Interrupts others.    He denied experiencing significant issues with this symptom.  Impatience.    He denied experiencing significant issues with this  symptom.  ADHD-Related Symptoms that Assist in Identifying ADHD but are not in the DSM-5-TR Jeanna, 2021, p. 6-12 and [phone])     Emotional dysregulation and overstimulation. He endorsed being prone to irritability if he "[did not] get enough sleep" or someone interrupts him while he is focused on a task, noting that after being distracted from a task it can be hard to determine "where [he] stopped," to shift his attention back to the task, and/or he tends to "lose motivation" for the task.    Making decisions impulsively. He stated how he is prone to making purchases, of primarily food, that he does not need, which contributes to financial strains.     Tending to drive much faster than others. He described how he is prone to driving 89-84fey over the speed limit, stating he has a "heavy foot" which has led to three speeding tickets. He further described how inattention and a desire to "get home faster" contribute to this symptom.     Trouble following through on promises or commitments made to others.    He denied experiencing significant issues with this symptom.  Trouble doing things in proper order or sequence.   He denied experiencing significant issues with this symptom.   Mr. Bekker discussed a history of generalized anxiety that occurs approximately two-to-three days a week, noting it is "not particularly hard" to manage the symptoms; occasional sleep-onset and maintenance issues that he attributed to his changing employment hours and his son "playing with [him]" while he is  sleeping, as well as regularly not feeling rested upon waking; alternating between inadequate caloric intake (e.g., eating only one meal a day) and adequate caloric intake, noting his employment schedule is a primary reason he does not eat consistently;   Mr. Abrahamsen reported a belief that his ADHD-related symptoms are consistent across situations and exacerbated by reduced sleep.   Mr. Kemler denied awareness of having  ever experienced any developmental milestone delays, grade retention, learning disability diagnosis, or having an individualized education plan. He stated he "made straight As and Bs" before moving to [place]  in the sixth grade, noting how his new school required him to "switch classrooms" for various classes, which he struggled to adjust to. He further stated a belief that his grades decreased to "Bs to Ds" during this time, which he attributed to difficulty adjusting to the routine of the new school and inattention during class. He noted how he was prone to forgetting needed materials for class, had trouble "waking up and getting ready" for school, and was prone to last-minute completion of chores at home. He reported that he graduated from high school. 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 He described relational strains with his fianc that occur secondary to a "lack of communication," which he attributed to fatigue and "not engaging."   Mr. Jeancharles reported that his medical history is significant for seizures, for which he utilizes Kepra to assist in managing. He denied awareness of ever experiencing seizures or head injury. He reported he currently utilizes Wellbutrin  for his ADHD-related symptoms, but that it "seems to make [him] more tired" at home but is "energizing" when he is "at work and has things to do." He discussed that he uses 1-2 standard size Monster energy drinks daily; "used to drink alcohol a lot," but "currently slowed down" to only drinking on "special occasions," adding approximately ten years ago he was prone to drinking until "blacking out," and that it led to relational strains with others; and ceased use of cigarettes secondary to concerns about the negative impact its use could have on his son. He denied use of all  other recreational and illicit substances. He also denied ever experiencing psychiatric hospitalization or meeting the full criteria for a depressive episode; hypomania- or mania-related episode; obsessions and compulsion; psychosis; trauma- and stressor-related disorder; suicidal or homicidial ideation, plan, or intent; or legal involvement.  He stated familial mental health history is significant for autism spectrum disorder (son) and ADHD (half-brother).   Chart Review: Per a 06/10/2024 appointment note, Lynwood Crandall, NP, reported "states that he and his fiance have noticed that he is having  concentration deficits. States that he will be at work and start something and then go to another task without noticein. States that he can be watching tv and talking to his fianace and then he will get into the show and not hear what she is saying. 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 States prior to his surgery." Mr. Crandall noted Mr. Haidar was started on Wellbutrin  "to see if it helps his concentration." A referral  for an ADHD evaluation was also placed.                 Frederic ONEIDA Fire, PsyD

## 2024-08-02 ENCOUNTER — Ambulatory Visit: Admitting: Psychology

## 2024-08-06 ENCOUNTER — Other Ambulatory Visit: Payer: Self-pay | Admitting: Nurse Practitioner

## 2024-08-06 ENCOUNTER — Ambulatory Visit (HOSPITAL_COMMUNITY)
Admission: EM | Admit: 2024-08-06 | Discharge: 2024-08-06 | Disposition: A | Discharge status: Home / Self Care | Attending: Family Medicine | Admitting: Family Medicine

## 2024-08-06 ENCOUNTER — Encounter (HOSPITAL_COMMUNITY): Payer: Self-pay

## 2024-08-06 DIAGNOSIS — S0993XA Unspecified injury of face, initial encounter: Secondary | ICD-10-CM

## 2024-08-06 DIAGNOSIS — J029 Acute pharyngitis, unspecified: Secondary | ICD-10-CM | POA: Diagnosis not present

## 2024-08-06 DIAGNOSIS — R41840 Attention and concentration deficit: Secondary | ICD-10-CM

## 2024-08-06 LAB — POC COVID19/FLU A&B COMBO
Covid Antigen, POC: NEGATIVE
Influenza A Antigen, POC: NEGATIVE
Influenza B Antigen, POC: NEGATIVE

## 2024-08-06 MED ORDER — IBUPROFEN 800 MG PO TABS
800.0000 mg | ORAL_TABLET | Freq: Three times a day (TID) | ORAL | 0 refills | Status: AC
Start: 2024-08-06 — End: ?

## 2024-08-06 NOTE — ED Triage Notes (Signed)
 Patient states he was using a machette tool last night when he pulled back on the tool and hit his right upper eye lid. Patient has black swollen eye. Patient reports a severe headache and sore throat.

## 2024-08-06 NOTE — ED Provider Notes (Signed)
 [facility]   [id number] 08/06/24 Arrival Time: 1310  ASSESSMENT & PLAN:  1. Sore throat   2. Facial injury, initial encounter    No concern for intracranial insult. Denies vision changes.  Results for orders placed or performed during the hospital encounter of 08/06/24  POC Covid19/Flu A&B Antigen   Collection Time: 08/06/24  2:42 PM  Result Value Ref Range   Influenza A Antigen, POC Negative Negative   Influenza B Antigen, POC Negative Negative   Covid Antigen, POC Negative Negative   OTC symptom care as needed.  Meds ordered this encounter  Medications   ibuprofen  (ADVIL ) 800 MG tablet    Sig: Take 1 tablet (800 mg total) by mouth 3 (three) times daily with meals.    Dispense:  21 tablet    Refill:  0     Follow-up Information     Wendee Lynwood HERO, NP.   Specialties: Nurse Practitioner, Family Medicine Why: As needed. Contact information: [address] [place] [address] [phone]         [facility] Emergency Department at [facility].   Specialty: Emergency Medicine Why: If symptoms worsen in any way. Contact information: [address] [place]  [address] [phone]                Reviewed expectations re: course of current medical issues. Questions answered. Outlined signs and symptoms indicating need for more acute intervention. Understanding verbalized. After Visit Summary given.   SUBJECTIVE: History from: Patient. Howard Pena is a 28 y.o. male. Patient states he was using a machete last night when he pulled back on the tool and hit his right upper eye lid. Patient has black swollen eye. Patient reports a severe headache and sore throat.  Denies vision changes/HA/n/v. Normal ambulation. Denies: fever. Normal PO intake without n/v/d.  OBJECTIVE:  Vitals:   08/06/24 1351  BP: 123/89  Pulse: 63  Resp: 18  Temp: 98.1 F (36.7 C)  TempSrc: Oral  SpO2: 100%    General  appearance: alert; no distress Eyes: PERRLA; EOMI; conjunctiva normal; mild swelling and bruising around upper eyelid; small abrasion; without specific orbital TTP HENT: [place]; AT; without nasal congestion Neck: supple  Lungs: speaks full sentences without difficulty; unlabored Extremities: no edema Skin: warm and dry Neurologic: normal gait Psychological: alert and cooperative; normal mood and affect  Labs: Results for orders placed or performed during the hospital encounter of 08/06/24  POC Covid19/Flu A&B Antigen   Collection Time: 08/06/24  2:42 PM  Result Value Ref Range   Influenza A Antigen, POC Negative Negative   Influenza B Antigen, POC Negative Negative   Covid Antigen, POC Negative Negative   Labs Reviewed  POC COVID19/FLU A&B COMBO    Imaging: No results found.  Allergies  Allergen Reactions   Bee Venom Swelling   Amoxicillin Other (See Comments)    Did it involve swelling of the face/tongue/throat, SOB, or low BP? Unknown Did it involve sudden or severe rash/hives, skin peeling, or any reaction on the inside of your mouth or nose? Unknown Did you need to seek medical attention at a hospital or doctor's office? Unknown When did it last happen?  pt was a child     If all above answers are "NO", may proceed with cephalosporin use.     Past Medical History:  Diagnosis Date   Constitutional growth delay    Goiter    Physical growth delay    Poor  appetite    Puberty delay    Seizures (HCC)    Social History   Socioeconomic History   Marital status: Single    Spouse name: Not on file   Number of children: 1   Years of education: Not on file   Highest education level: Not on file  Occupational History   Not on file  Tobacco Use   Smoking status: Former    Types: Cigars, E-cigarettes   Smokeless tobacco: Never   Tobacco comments:    quit 2 months ago  Vaping Use   Vaping status: Every Day   Substances: Nicotine, Flavoring  Substance and Sexual  Activity   Alcohol use: Yes    Comment: on the weekends sometimes. depends on the occassion beers   Drug use: No   Sexual activity: Yes    Birth control/protection: None  Other Topics Concern   Not on file  Social History Narrative   [place] (3)   Is engaged to [employer]   Fulltime: order selector at [employer]         Hobbies: none   Social Drivers of Corporate investment banker Strain: Not on file  Food Insecurity: Not on file  Transportation Needs: Not on file  Physical Activity: Not on file  Stress: Not on file  Social Connections: Not on file  Intimate Partner Violence: Not on file   Family History  Problem Relation Age of Onset   Thyroid  disease Mother    Post-traumatic stress disorder Father    Stomach cancer Other    Cancer Maternal Great-grandmother        not sure of the type   Past Surgical History:  Procedure Laterality Date   HAND SURGERY Right    has screw in the right wrist/thumb area   ORIF HUMERUS FRACTURE Left 10/09/2023   Procedure: LEFT OPEN REDUCTION INTERNAL FIXATION (ORIF) PROXIMAL HUMERUS FRACTURE;  Surgeon: Genelle Standing, MD;  Location: [facility] OR;  Service: Orthopedics;  Laterality: Left;   right ear surgery       Rolinda Rogue, MD 08/06/24 1526

## 2024-08-08 NOTE — Progress Notes (Addendum)
 Testing and Report Writing Information: The following measures  were administered, scored, and interpreted by this provider:  Generalized Anxiety Disorder-7 (GAD-7; 5 minutes), Patient Health Questionnaire-9 (PHQ-9; 5 minutes), Wechsler Adult Intelligence Scale-Fourth Edition (WAIS-5; 70 minutes), CNS Vital Signs (45 minutes), Adult Attention Deficit/Hyperactivity Disorder Self-Report Scale Checklist (ASRSv1.1; 15 minutes), Behavior Rating Inventory for Executive Function - 2A - Self Report (BRIEF 2A; 10 minutes) and Behavior Rating Inventory for Executive Function - 2A - Informant (BRIEF-2A; 10 minutes), and Personality Assessment Inventory (PAI; 50 minutes). A total of 210 minutes was spent on the administration and scoring of the aforementioned measures. Codes 03863 and [phone] (6 units) were billed.  Please see the assessment for additional details. This provider completed the written report which includes integration of patient data, interpretation of standardized test results, interpretation of clinical data, review of information provided by Howard Pena Pena and any collateral information/documentation, and clinical decision making (275 minutes in total).  Feedback Appointment: Date: 08/09/2024 Appointment Start Time: 10am Duration: 41 minutes Provider: Frederic Fire, PsyD Type of Session: Feedback Appointment for Evaluation  Location of Patient: Home Location of Provider: Provider's Home (private office) Type of Contact: Caregility video visit with audio  Session Content: Today's appointment was a telepsychological visit due to COVID-19. Howard Pena is aware it is his responsibility to secure confidentiality on his end of the session. He provided verbal consent to proceed with today's appointment. Prior to proceeding with today's appointment, Howard Pena Pena's physical location at the time of this appointment was obtained as well a phone number he could be reached at in the event of technical difficulties. Howard Pena Pena  denied anyone else being present in the room or on the virtual appointment. Howard Pena Pena is aware of the limitations of teleheath visits and verbally consented to proceed.  This provider and Howard Pena completed the interactive feedback session which includes reviewing the aforementioned measures, treatment recommendations, and diagnostic conclusions.   The interactive feedback session was completed today and a total of 41 minutes was spent on feedback. Code 03867 was billed for feedback session.   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 07/23/2024: 3:30-4:30pm. 07/24/2024: 2:55-3:45pm. 07/26/2024: 10:15-10:35am. 07/28/2024: 12:20-12:55pm and 2:50-3:05pm. 07/30/2024: 9:55-10:10am, 2:40-3:10pm, 4:35-5:10pm, and 9:00-9:15pm. 08/08/2024: 8:10-8:20am (billing code 03866 [5 units])  Plan: Howard Pena provided verbal consent for his evaluation to be sent via e-mail. No further follow-up planned by this provider.       CONFIDENTIAL PSYCHOLOGICAL EVALUATION ______________________________________________________________________________ Name: Howard Pena Pena Date of Birth: September 08, 1996    Age: 28 Dates of Evaluation: 07/18/2024, 07/19/2024, 07/23/2024, and 07/25/2024  SOURCE AND REASON FOR REFERRAL: Howard Pena Pena was referred by Howard Pena Crandall, DNP, for an evaluation to ascertain if he meets the criteria for Attention Deficit/Hyperactivity Disorder (ADHD).   EVALUATIVE PROCEDURES: Clinical Interview with Howard Pena Pena (07/18/2024) Wechsler Adult Intelligence Scale-Fourth Edition (WAIS-5; 07/25/2024) CNS Vital Signs (07/19/2024) Adult Attention Deficit/Hyperactivity Disorder Self-Report Scale Checklist (07/19/2024) Behavior Rating Inventory for Executive Function - A - Self Report Behavior Rating  Inventory for Executive Function - 2A - Self Report (BRIEF-2A; 07/23/2024) and Informant (07/23/2024) Personality Assessment Inventory (PAI; 07/19/2024) Patient Health Questionnaire-9 (PHQ-9) Generalized Anxiety Disorder-7 (GAD-7)   BACKGROUND INFORMATION AND PRESENTING PROBLEM: Howard Pena Pena is a 28 year old male who resides in [place] .  Howard Pena Pena initially reported ADHD symptoms became noticeable over the past two years, which he indicated  may be attributable to the stress and "little rest" that working long hours during the second and third shifts at his employment entails. However, during a latter portion of the clinical interview with this evaluator, he indicated ADHD-related symptoms were present in childhood and that the reduced structure and increased responsibilities of adulthood may be causing his ADHD-related symptoms to become more noticeable. Upon a follow-up appointment, he expressed a belief the ADHD-related symptoms were not present in childhood. He endorsed the following ADHD-related symptoms:    Symptoms Frequency   Often Occasionally Infrequent/No Significant Issues  Inattention Criterion (DSM-5-TR)     Making careless mistakes and missing small details.    He did not endorse significant issues with this symptom.   Being easily distracted by various stimuli and the mind often being elsewhere even when no apparent distractions exist.  He discussed becoming distracted by various stimuli (e.g., irrelevant tasks and thoughts as well as others talking around him), noting he is prone to daydreaming even when there are no obvious distractions.     Trouble sustaining attention during conversations.  He reported that this commonly occurs, which leads to his missing the details of conversations.     Does not follow through on instructions and fails to finish tasks.  He endorsed commonly experiencing task initiation and maintenance issues.     Difficulty organizing tasks and  activities.  He discussed that he regularly completes tasks in a haphazard manner and last minute, is prone to time blindness, and has trouble planning, noting inattention and forgetfulness contribute.     Avoids, dislikes, or is reluctant to engage in tasks that require sustained mental effort. He stated he often puts off tasks that he does not perceive as urgent.     Loses things necessary for tasks or activities.  He shared that he forgets items "all the time" and is prone to misplacing them, especially if he is "rushing."     Forgetful in daily activities.  He also shared he is prone to forgetting what he wants to say, needed items for tasks, plans, and to return calls or texts, despite his efforts to remember.     Hyperactivity and Impulsivity Criterion (DSM-5-TR)     Fidgets with hands or feet or squirms in seat.   He stated he "tend[s] to hum a lot" when the environment is perceived as loud, noting it is habitual and largely unconscious. However, he denied having significant issues with fidgeting or squirming.  Leaves seat in situations in which remaining seated is expected.   He denied experiencing significant issues with this symptom.   Feelings of restlessness.  He stated he tends to only feel restless or agitated after days in which he does not get adequate sleep.    Difficulty playing or engaging in leisure activities quietly. He described being prone to making comments during leisure activities, which his fianc comments on. He added once someone comments on his talking during a leisure activity, he usually stops making comments.     "On the go" or often acts as if "driven by a motor." He stated he is prone to "rushing" tasks, "overthinking," and feeling uncomfortable when observed by others.     Talks excessively.  He described being prone to providing excessive details while talking, which others comment on.    Interrupts others.    He denied experiencing significant issues with this  symptom.  Impatience.    He denied experiencing significant issues with this symptom.  ADHD-Related Symptoms  that Assist in Identifying ADHD but are not in the DSM-5-TR (Barkley, 2021, p. 6-12 and [phone])     Emotional dysregulation and overstimulation. He endorsed being prone to irritability if he "[did not] get enough sleep" or someone interrupts him while he is focused on a task, noting that after being distracted from a task it can be hard to determine "where [he] stopped," to shift his attention back to the task, and/or he tends to "lose motivation" for the task.    Making decisions impulsively. He stated he is prone to making purchases, of primarily food, that he does not need, which contributes to financial strains.     Tending to drive much faster than others. He described being prone to driving 89-84fey over the speed limit, stating he has a "heavy foot" which has led to three speeding tickets. He further described inattention and a desire to "get home faster" contribute to this symptom.     Trouble following through on promises or commitments made to others.    He denied experiencing significant issues with this symptom.  Trouble doing things in proper order or sequence.   He denied experiencing significant issues with this symptom.   Mr. Chaves discussed a history of generalized anxiety that occurs approximately two-to-three days a week, noting it is "not particularly hard" to manage the symptoms; occasional sleep-onset and maintenance issues that he attributed to his changing employment hours and his son "playing with [him]" while he is sleeping, as well as regularly not feeling rested upon waking; and alternating between inadequate caloric intake (e.g., eating only one meal a day) and adequate caloric intake, noting his employment schedule is a primary reason he does not eat consistently. Mr. Rout reported a belief that his ADHD-related symptoms are consistent across situations and  exacerbated by reduced sleep.   Mr. Advani denied awareness of having ever experienced any developmental milestone delays, grade retention, learning disability diagnosis, or having an individualized education plan. He stated he "made straight As and Bs" before moving to [employer]  in the sixth grade, noting his new school required him to "switch classrooms" for various classes, which he struggled to adjust to. He further stated a belief that his grades decreased from "Bs to Ds" during this time, which he attributed to difficulty adjusting to the routine of the new school and inattention during class. He noted he was prone to forgetting needed materials for class, had trouble "waking up and getting ready" for school, and was prone to last-minute completion of chores at home. Mr. Sprinkle reported that he graduated from high school. 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 He described relational strains with his fianc that occur secondary to a "lack of communication," which he attributed to fatigue and "not engaging."   Mr. Banks reported that his medical history is significant for seizures, for which he utilizes Kepra.  He denied awareness of ever experiencing seizures or head injury. He reported he currently utilizes Wellbutrin  for his ADHD-related symptoms, but that it "seems to make [him] more tired" at home but is "energizing" when he is "at work and [has] things to do." He discussed that he uses 1-2 standard size Monster energy drinks daily; "used to drink alcohol a lot," but "currently slowed down" to only drinking on "special occasions," adding approximately ten years ago he was prone to drinking until "blacking  out," and that it led to relational strains with others; and ceased use of cigarettes secondary to concerns about the negative  impact its use could have on his son. He denied use of all other recreational and illicit substances. Mr. Wadhwa also denied ever experiencing psychiatric hospitalization or meeting the full criteria for a depressive episode; hypomania- or mania-related episode; obsessions and compulsion; psychosis; trauma- and stressor-related disorder; suicidal or homicidal ideation, plan, or intent; or legal involvement. He stated his familial mental health history is significant for autism spectrum disorder (son) and ADHD (half-brother).   Chart Review: Per a 06/10/2024 appointment note, Howard Pena Crandall, NP, reported Mr. Dibble "states that he and his fiance have noticed that he is having  concentration deficits. States that he will be at work and start something and then go to another task without noticein. States that he can be watching tv and talking to his fianace and then he will get into the show and not hear what she is saying. 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 States that he will start an order and he will start talking to a co worker and forget that he is doing an order. He feels like this has been going on for approx 1 year. States prior to his surgery." Mr. Pena noted Mr. Goetzke was started on Wellbutrin  "to see if it helps his concentration." A referral for an ADHD evaluation was also placed.   BEHAVIORAL OBSERVATIONS: Mr. Heft presented on time for the evaluation. He was well-groomed. He was oriented to the time, place, person, and purpose of the appointment. During the initial appointment, Mr. Mastro demonstrated limited insight (e.g., struggled to answer some questions about his experiences and/or provided different answers to similar questions later during the clinical interview). During the evaluation, Mr. Kellis verbalized and/or demonstrated self-expression difficulties on a couple of occasions (e.g., stating "[He is] trying to  think of a way to say [a word's definition]") and working memory-related problems (e.g., asking this evaluator to repeat an answer he had recently given as he was uncertain about what he had already said as well as indicating he abruptly loss the verbally provided digits he was trying to retain). Throughout the evaluation, he maintained appropriate eye contact. His thought processes and content were logical, coherent, and goal-directed. There were no overt signs of a thought disorder or perceptual disturbances, nor did he report such symptomatology. There was no evidence of paraphasias (i.e., errors in speech, gross mispronunciations, and word substitutions), repetition deficits, or disturbances in volume or prosody (i.e., rhythm and intonation). Overall, based on Mr. Leonhardt approach to testing, the current results are believed to be a fair estimate of his abilities.  PROCEDURAL CONSIDERATIONS: Psychological testing measures were conducted through a virtual visit with video and audio capabilities, but otherwise in a standard manner.   The Wechsler Adult Intelligence Scale, Fifth Edition (WAIS-5) was administered via remote telepractice using digital stimulus materials on [employer]'s Q-global system. The remote testing environment appeared free of distractions, adequate rapport was established with the examinee via video/audio capabilities, and Mr. Sookram appeared appropriately engaged in the task throughout the session. No significant technological problems or distractions were noted during administration. Modifications to the standardization procedure included: none. The WAIS-5 subtests, or similar tasks, have received initial validation in several samples for remote telepractice and digital format administration, and the results are considered a valid description of Mr. Matters skills and abilities.  CLINICAL FINDINGS:  COGNITIVE FUNCTIONING  Wechsler Adult Intelligence Scale, Fourth Edition  (WAIS-5): Mr. Mclester completed subtests of the WAIS-5, a full-scale measure of cognitive ability. The WAIS-5 comprises four indices that measure cognitive processes that are components of intellectual ability; however, only subtests from the Verbal Comprehension and Working Memory indices were administered. As a result, Full-Scale-IQ (FSIQ) and General Ability Index (GAI) could not be determined.   WAIS-5 Scale/Subtest Composite Score/Scaled Score 95% Confidence Interval Percentile Rank Qualitative Description  Verbal Comprehension (VCI) 89 83-97 23 Below Average  Similarities 9     Vocabulary 7     Working Memory (WMI) 85 79-93 16 Below Average  Digit Sequencing  6     Running Digits 9     Digits Forward 6       The Verbal Comprehension Index (VCI) measures one's ability to receive, comprehend, and express language. It also measures the ability to retrieve previously learned information and to understand relationships between words and concepts presented orally. Mr. Gongora obtained a VCI score of 89 (23rd percentile), placing his in the below average range compared to same-aged peers. His performance on the subtests comprising this index was similar, which suggests his verbal reasoning abilities are comparably developed.  The Working Memory Index (WMI) measures one's ability to sustain attention, concentrate, and exert mental control. Mr. Pruett obtained a WMI score of 85 (16th percentile), placing him in the Average range compared to same-aged peers. Mr. Cypress best performance was on the Running Digits subtest, and his lowest performance was on the Digit Sequencing and Digits Forward subtest. This performance pattern suggests he has stronger maintenance, refreshing, and/or updating skills relative to mental manipulation of the material. It may also indicate a relatively stronger ability to register auditory stimuli when presented at a quicker pace, more similar to the speed of speech as  opposed to a slower pace.   ATTENTION AND PROCESSING  CNS Vital Signs: The CNS Vital Signs assessment evaluates the neurocognitive status of an individual and covers a range of mental processes. The results of the CNS Vital Signs testing indicated very low neurocognitive processing ability. Attentional abilities ranged from very low to low average, with simple attention and complex attention in the very low range and sustained attention in the low average range. Executive function and cognitive flexibility were very low. Working memory was low. Psychomotor speed, processing speed, and reaction time ranged from low to low average, which suggests a weakness in hand-eye coordination and thinking speed. Motor speed was average. Visual memory (images) was low average, and verbal memory (words) was low, which indicates visual memory is a relative strength. The results suggest Mr. Marchi experiences impairment in verbal memory, complex attention, cognitive flexibility, reaction time, processing speed, executive function, working memory, and simple attention; weakness in visual memory, psychomotor speed, and sustained attention; and a relative strength in motor speed.   Domain  Standard Score Percentile Validity Indicator Guideline  Neurocognitive Index 66 1 Yes Very Low  Composite Memory 73 4 Yes Low  Verbal Memory 64 1 Yes Very Low  Visual Memory 88 21 Yes Below Average  Psychomotor Speed 82 12 Yes Below Average  Reaction Time 73 4 Yes Low   Complex Attention 62 1 Yes Very Low  Cognitive Flexibility 42 1 Yes Very Low  Processing Speed  73 4 Yes Low  Executive Function 41 1 Yes Very Low  Working Memory 74 4 Yes Low  Sustained Attention 83 13 Yes Below Average  Simple Attention  61 1 Yes Very Low  Motor Speed 96 40 Yes Average   EXECUTIVE FUNCTION  Behavior Rating Inventory of Executive Function, Second Edition Event organiser) Self-Report:  Mr. Yandell completed the Self-Report Form of the Behavior Rating  Inventory of Executive Function-Adult Version, Second Edition [employer]), which has three domains that evaluate cognitive, behavioral, and emotional regulation, and a Global Executive Composite score provides an overall snapshot of executive functioning. There are no missing item responses in the protocol.  The Negativity, Infrequency, and Inconsistency scales are not elevated, suggesting he did not respond to the protocol in an overly negative, haphazard, extreme, or inconsistent manner. In the context of these validity considerations, ratings of Mr. Clasby everyday executive function suggest some areas of concern. The overall index score, the GEC, was within normal limits (GEC T = 64, %ile = 85). The Behavior Regulation Index (BRI) and Cognitive Regulation Index (CRI) scores were within normal limits (BRI T = 60, %ile = 85; CRI T = 64, %ile = 86), but the Emotion Regulation Index (ERI) score was mildly elevated (ERI T = 65, %ile = 90). Mr. Aceituno indicated difficulty with his ability to sustain working memory. He did not describe his ability to resist impulses, be aware of their functioning in social settings, adjust well to changes, react to events appropriately, get going on tasks and activities and independently generate ideas, plan and organize their approach to problem solving appropriately, be appropriately cautious in their approach to tasks and check for mistakes, and keep materials and belongings reasonably well-organized as problematic.    Scale/Index  Raw Score T Score Percentile Qualitative Description  Inhibit 13 56 75 Within Normal Limits  Self-Monitor 11 64 91 Approaching an Elevation  Behavior Regulation Index (BRI) 24 60 85 Approaching an Elevation  Shift 12 62 86 Approaching an Elevation  Emotional Control 16 62 89 Approaching an Elevation  Emotion Regulation Index (ERI) 28 65 90 Mildly Elevated  Initiate 15 58 77 Within Normal Limits  Working Memory 18 68 97 Mildly Elevated   Plan/Organize 15 58 77 Within Normal Limits  Task Monitor 12 63 93 Approaching an Elevation  Organization of Materials 14 57 75 Within Normal Limits  Cognitive Regulation Index (CRI) 74 64 86 Approaching an Elevation  Global Executive Composite (GEC) 126 64 85 Approaching an Elevation   Validity Scale Raw Score Cumulative Percentile Protocol Classification  Inconsistency 4 98 Acceptable  Negativity 0 98 Acceptable  Infrequency 0 98 Acceptable   Behavior Rating Inventory of Executive Function, Second Edition Event organiser) Informant:  Mr. Malkin spouse, Ms. Janace Brandy, completed the Informant Form of the Behavior Rating Inventory of Executive Function-Adult Version, Second Edition (BRIEF-2A), which is equivalent to the Self-Report version and has three domains that evaluate cognitive, behavioral, and emotional regulation, and a Global Executive Composite score provides an overall snapshot of executive functioning. There are no missing item responses in the protocol. The Negativity, Infrequency, and Inconsistency scales are not elevated, suggesting she did not respond to the protocol in an overly negative, haphazard, extreme, or inconsistent manner. In the context of these validity considerations, Ms. Joline ratings of Mr. Jablonowski everyday executive function suggest some areas of concern. The overall index score, the GEC, was within normal limits (GEC T = 63, %ile = 84). The Emotion Regulation Index (ERI) and Cognitive Regulation Index (CRI) scores were within normal limits (ERI T = 55, %ile = 76; CRI T = 61, %ile = 83), but the Behavior Regulation Index (BRI) score was mildly elevated (  BRI T = 66, %ile = 92). Ms. Artis indicated Mr. Khachatryan experiences difficulty with his ability to be aware of his functioning in social settings, get going on tasks and activities and independently generate ideas, and sustain working memory. Ms. Artis did not describe his ability to resist impulses,  adjust well to changes, react to events appropriately, plan and organize his approach to problem solving appropriately, be appropriately cautious in his approach to tasks and check for mistakes, and keep materials and belongings reasonably well-organized as problematic.   Scale/Index  Raw Score T Score Percentile Qualitative Description  Inhibit 14 60 87 Approaching an Elevation  Self-Monitor 13 65 96 Mildly Elevated  Behavior Regulation Index (BRI) 27 66 92 Mildly Elevated  Shift 9 52 71 Within Normal Limits  Emotional Control 14 57 77 Within Normal Limits  Emotion Regulation Index (ERI) 23 55 76 Within Normal Limits  Initiate 16 65 94 Mildly Elevated  Working Memory 16 68 96 Mildly Elevated  Plan/Organize 11 50 63 Within Normal Limits  Task-Monitor 12 61 96 Mildly Elevated  Organization of Materials 12 51 63 Within Normal Limits  Cognitive Regulation Index (CRI) 67 61 83 Approaching an Elevation  Global Executive Composite (GEC) 117 63 84 Approaching an Elevation   Validity Scale Raw Score Cumulative Percentile Protocol Classification  Inconsistency 3 99 Acceptable  Negativity 1 98 Acceptable  Infrequency 0 98 Acceptable   BEHAVIORAL FUNCTIONING   Patient Health Questionnaire-9 (PHQ-9): Mr. Arther completed the PHQ-9, a self-report measure that assesses symptoms of depression. He scored 3/27, which indicates minimal depression.   Generalized Anxiety Disorder-7 (GAD-7): Mr. Altland completed the GAD-7, a self-report measure that assesses symptoms of anxiety. He scored 3/21, which indicates minimal anxiety.   Adult ADHD Self-Report Scale Symptom Checklist (ASRS): Mr. Burgher reported the following symptoms as sometimes: difficulty wrapping up the final details of a project following the completion of challenging aspects, difficulty getting things in order when a task requires organization, fidgeting or squirming, feeling overly active and compelled to do things, struggling to sustain  attention when doing boring or repetitive work, interrupting others or finishing their sentences, and interrupting others when they are busy. She endorsed the following symptoms as occurring often: avoiding or delaying getting started on tasks requiring a lot of thought, making careless mistakes when working on boring or complex projects, struggling to concentrate on what people say even when they are speaking directly to him, misplacing or having difficulty finding things, and feeling restless or fidgety. He endorsed the following symptoms as very often: being distracted by the noise around him, difficulty relaxing, and talking too much in social situations. The endorsement of at least four items in Part A is highly consistent with ADHD in adults. The frequency scores of Part B provide additional cues. Mr. Crisco scored 3/6 on Part A and 9/12 on Part B, which is considered a negative screening for ADHD.   Personality Assessment Inventory (PAI): The PAI is an objective inventory of adult personality. The validity indicators suggested Mr. Mcentee profile is interpretable (ICN T = 52, INF T = 63, NIM T = 51, and PIM T = 50). He is endorsing ruminative worry and thoughts of inadequacy that may impair concentration (ANX-C T = 61 and DEP-C T = 64), anhedonia (DEP-A T = 61), impatience and being easily frustrated (MAN-I T = 61 and BOR-A T = 66), and problems with concentration and decision making (SCZ-T T = 67). He also endorsed being assertive (AGG-V T =  62) and pragmatic and skeptical in relationships with others (PAR-H T = 63), as well as being inclined to attribute his misfortunes to the neglect of others (PAR-R T = 72). He indicated being generally satisfied with himself and seeing little need for major changes in behavior (RXR T = 55).     SUMMARY AND CLINICAL IMPRESSIONS: Mr. Keigan Tafoya is a 28 year old male who was referred by Howard Pena Crandall, DNP for an evaluation to determine if he currently meets  criteria for a diagnosis of Attention-Deficit/Hyperactivity Disorder (ADHD).   Mr. Piscitello reported conflicting information regarding the onset of endorsed ADHD-related symptoms, initially noting they were noticeable only over the past two years, which he attributed to changing employment shifts and "little rest." However, he later indicated some symptoms were present in childhood, but then noted at a follow-up appointment that he did not believe they were present in childhood. Nevertheless, he expressed a belief that his ADHD-related symptoms are consistent across situations and exacerbated by reduced sleep.  Mr. Ransford was administered assessments during the evaluation to measure his current cognitive abilities. His verbal comprehension abilities were in the below-average range and comparably developed. His ability to sustain attention, concentrate, and exert mental control was also in the below-average range, although his subtest results varied. Results of the CNS Vital Signs indicated an average neurocognitive processing ability, although he demonstrated a weakness in attentional skills and working memory. His best performance was on the Running Digits subtest, and his lowest performance was on the Digit Sequencing and Digits Forward subtest. This performance pattern suggests he has stronger maintenance, refreshing, and/or updating skills relative to mental manipulation of the material. It may also indicate a relatively stronger ability to register auditory stimuli when presented at a quicker pace, more similar to the speed of speech, as opposed to a slower pace.   During the clinical interview and on self-report measures, Mr. Pekala endorsed executive functioning concerns that include attentional dysregulation and hyperactivity- and impulsivity-related symptoms, as well as indicated meeting the full criteria for ADHD. However, information provided throughout the course of this evaluation and his  endorsement of executive functioning difficulties on measures were conflicting. Moreover, his fianc, Ms. Janace Brandy, largely denied a perception that he is experiencing significant executive function issues (e.g., only three scales were mildly elevated on the measure she completed). When considering his indication that ADHD-related symptoms did not become noticeable until the past couple of years, he and Ms. Brandy, largely denying he is experiencing significant executive functioning issues on the BRIEF-2A, it does not appear that the full criteria for ADHD is currently met.   Mr. Vallier also endorsed a history of generalized anxiety; occasional sleep-onset and maintenance issues, as well as regularly not feeling rested upon waking that he attributed to his changing work shifts; and alternating between inadequate caloric intake and adequate caloric intake. As such, the PHQ-9, GAD-7, and PAI were administered. His results suggested he experiences minimal depression- and anxiety-related symptomatology. 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 He did not endorse strong indications of an anxiety, depressive, or eating disorder, but may benefit from further evaluation of these symptoms to definitively rule in or out the disorders. Given the limited scope of this evaluation, it is not possible to definitively determine what may be causing his endorsed ADHD-related symptoms.  However, it appears likely that his endorsed sleep impairment and inadequate caloric intake, which he attributed to his employment's fluctuating and extended hours, are at least partially contributing to them. Should his endorsed sleep impairment and inadequate caloric intake be addressed, and his ADHD-related  symptoms remain, he may benefit from re-evaluation.   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 Dutch may benefit from making use of strategies for endorsed executive functioning issues:  Setting a timer to complete tasks. Break tasks into manageable chunks and spread them out over more extended periods with breaks.  Utilizing lists and day calendars to keep track of tasks.  Answering emails daily.  Improve listening skills by asking the speaker to give information in smaller chunks and asking for explanations and clarification as needed. Leaving more than the anticipated time to complete tasks. It may help to keep tasks brief, well within your attention span, and a mix of both high and low-interest tasks. Tasks may be gradually increased in length. Utilize visual reminders by posting appointments, "to-do lists," or schedules in strategic areas at home and work.  Practice using an appointment book, smartphone, or other tech device, or a daily planning calendar, and learn to write down appointments and commitments immediately. Keep notepads or use a portable audio recorder to capture important ideas that would be beneficial to recall later. The 4Rs: Read just one paragraph, recite out loud in a soft voice or whisper what was important in that material, write that material down in a notebook, then review what you just wrote. Mr. Nishi may benefit from mindfulness training to address symptoms of inattention and sleep issues.  Individual therapeutic services may assist with his endorsed sleep impairment, inadequate caloric intake, and anxiety-related symptoms. Mental alertness/energy can be raised by increasing exercise; improving sleep; eating a healthy diet; and managing stress. Consulting with a physician regarding any changes to the physical regimen is recommended. Future evaluation, if deemed necessary, and/or to  determine the effectiveness of recommended interventions.   Howard Pena Pena, Psy.D. Licensed Psychologist - [facility] [phone]   References  Freya, R. A. (2021). Taking charge of adult ADHD: proven strategies to succeed at work, at   home, and in relationships (pp. 6-10 and [phone]). [employer].              Howard Pena ONEIDA Fire, PsyD

## 2024-08-09 ENCOUNTER — Ambulatory Visit (INDEPENDENT_AMBULATORY_CARE_PROVIDER_SITE_OTHER): Payer: Self-pay | Admitting: Psychology

## 2024-08-09 DIAGNOSIS — G4726 Circadian rhythm sleep disorder, shift work type: Secondary | ICD-10-CM

## 2024-09-04 ENCOUNTER — Other Ambulatory Visit: Payer: Self-pay | Admitting: Family

## 2024-09-04 DIAGNOSIS — R41840 Attention and concentration deficit: Secondary | ICD-10-CM

## 2024-10-21 ENCOUNTER — Encounter: Payer: Self-pay | Admitting: Radiology
